# Patient Record
Sex: Female | Born: 1980 | Race: White | Hispanic: No | Smoking: Former smoker
Health system: Southern US, Community
[De-identification: ages and names within clinical notes are randomized; demographics above are authoritative.]

## PROBLEM LIST (undated history)

## (undated) DIAGNOSIS — T7840XA Allergy, unspecified, initial encounter: Secondary | ICD-10-CM

## (undated) DIAGNOSIS — F419 Anxiety disorder, unspecified: Secondary | ICD-10-CM

## (undated) DIAGNOSIS — F988 Other specified behavioral and emotional disorders with onset usually occurring in childhood and adolescence: Secondary | ICD-10-CM

## (undated) DIAGNOSIS — F32A Depression, unspecified: Secondary | ICD-10-CM

## (undated) DIAGNOSIS — D649 Anemia, unspecified: Secondary | ICD-10-CM

## (undated) DIAGNOSIS — G43909 Migraine, unspecified, not intractable, without status migrainosus: Secondary | ICD-10-CM

## (undated) HISTORY — DX: Other specified behavioral and emotional disorders with onset usually occurring in childhood and adolescence: F98.8

## (undated) HISTORY — DX: Allergy, unspecified, initial encounter: T78.40XA

## (undated) HISTORY — DX: Anxiety disorder, unspecified: F41.9

## (undated) HISTORY — DX: Anemia, unspecified: D64.9

## (undated) HISTORY — DX: Migraine, unspecified, not intractable, without status migrainosus: G43.909

## (undated) HISTORY — PX: TONSILLECTOMY: SHX5217

## (undated) HISTORY — DX: Depression, unspecified: F32.A

---
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
## 2008-01-07 ENCOUNTER — Ambulatory Visit: Payer: Self-pay

## 2009-03-06 ENCOUNTER — Emergency Department: Payer: Self-pay | Admitting: Unknown Physician Specialty

## 2010-03-29 ENCOUNTER — Ambulatory Visit: Payer: Self-pay | Admitting: Gynecologic Oncology

## 2010-04-04 ENCOUNTER — Ambulatory Visit: Payer: Self-pay | Admitting: Gynecologic Oncology

## 2010-04-04 ENCOUNTER — Ambulatory Visit: Payer: Self-pay

## 2010-04-27 ENCOUNTER — Ambulatory Visit: Payer: Self-pay | Admitting: Gynecologic Oncology

## 2013-10-05 ENCOUNTER — Ambulatory Visit: Payer: Self-pay

## 2013-11-18 DIAGNOSIS — Z8742 Personal history of other diseases of the female genital tract: Secondary | ICD-10-CM | POA: Insufficient documentation

## 2013-11-18 DIAGNOSIS — N879 Dysplasia of cervix uteri, unspecified: Secondary | ICD-10-CM | POA: Insufficient documentation

## 2014-06-05 ENCOUNTER — Emergency Department
Admit: 2014-06-05 | Disposition: A | Discharge status: Home / Self Care | Payer: Self-pay | Admitting: Emergency Medicine

## 2014-11-26 ENCOUNTER — Encounter: Payer: Self-pay | Admitting: Physician Assistant

## 2014-11-26 ENCOUNTER — Ambulatory Visit (INDEPENDENT_AMBULATORY_CARE_PROVIDER_SITE_OTHER): Payer: BLUE CROSS/BLUE SHIELD | Admitting: Physician Assistant

## 2014-11-26 VITALS — BP 108/60 | HR 80 | Temp 98.3°F | Resp 18 | Ht 66.0 in | Wt 123.8 lb

## 2014-11-26 DIAGNOSIS — R41840 Attention and concentration deficit: Secondary | ICD-10-CM | POA: Diagnosis not present

## 2014-11-26 DIAGNOSIS — Z7189 Other specified counseling: Secondary | ICD-10-CM

## 2014-11-26 DIAGNOSIS — Z23 Encounter for immunization: Secondary | ICD-10-CM | POA: Diagnosis not present

## 2014-11-26 DIAGNOSIS — Z7689 Persons encountering health services in other specified circumstances: Secondary | ICD-10-CM

## 2014-11-26 DIAGNOSIS — Z87891 Personal history of nicotine dependence: Secondary | ICD-10-CM | POA: Insufficient documentation

## 2014-11-26 DIAGNOSIS — Z720 Tobacco use: Secondary | ICD-10-CM | POA: Insufficient documentation

## 2014-11-26 NOTE — Patient Instructions (Signed)
Health Maintenance Adopting a healthy lifestyle and getting preventive care can go a long way to promote health and wellness. Talk with your health care provider about what schedule of regular examinations is right for you. This is a good chance for you to check in with your provider about disease prevention and staying healthy. In between checkups, there are plenty of things you can do on your own. Experts have done a lot of research about which lifestyle changes and preventive measures are most likely to keep you healthy. Ask your health care provider for more information. WEIGHT AND DIET  Eat a healthy diet  Be sure to include plenty of vegetables, fruits, low-fat dairy products, and lean protein.  Do not eat a lot of foods high in solid fats, added sugars, or salt.  Get regular exercise. This is one of the most important things you can do for your health.  Most adults should exercise for at least 150 minutes each week. The exercise should increase your heart rate and make you sweat (moderate-intensity exercise).  Most adults should also do strengthening exercises at least twice a week. This is in addition to the moderate-intensity exercise.  Maintain a healthy weight  Body mass index (BMI) is a measurement that can be used to identify possible weight problems. It estimates body fat based on height and weight. Your health care provider can help determine your BMI and help you achieve or maintain a healthy weight.  For females 25 years of age and older:   A BMI below 18.5 is considered underweight.  A BMI of 18.5 to 24.9 is normal.  A BMI of 25 to 29.9 is considered overweight.  A BMI of 30 and above is considered obese.  Watch levels of cholesterol and blood lipids  You should start having your blood tested for lipids and cholesterol at 36 years of age, then have this test every 5 years.  You may need to have your cholesterol levels checked more often if:  Your lipid or  cholesterol levels are high.  You are older than 36 years of age.  You are at high risk for heart disease.  CANCER SCREENING   Lung Cancer  Lung cancer screening is recommended for adults 97-92 years old who are at high risk for lung cancer because of a history of smoking.  A yearly low-dose CT scan of the lungs is recommended for people who:  Currently smoke.  Have quit within the past 15 years.  Have at least a 30-pack-year history of smoking. A pack year is smoking an average of one pack of cigarettes a day for 1 year.  Yearly screening should continue until it has been 15 years since you quit.  Yearly screening should stop if you develop a health problem that would prevent you from having lung cancer treatment.  Breast Cancer  Practice breast self-awareness. This means understanding how your breasts normally appear and feel.  It also means doing regular breast self-exams. Let your health care provider know about any changes, no matter how small.  If you are in your 20s or 30s, you should have a clinical breast exam (CBE) by a health care provider every 1-3 years as part of a regular health exam.  If you are 76 or older, have a CBE every year. Also consider having a breast X-ray (mammogram) every year.  If you have a family history of breast cancer, talk to your health care provider about genetic screening.  If you are  at high risk for breast cancer, talk to your health care provider about having an MRI and a mammogram every year.  Breast cancer gene (BRCA) assessment is recommended for women who have family members with BRCA-related cancers. BRCA-related cancers include:  Breast.  Ovarian.  Tubal.  Peritoneal cancers.  Results of the assessment will determine the need for genetic counseling and BRCA1 and BRCA2 testing. Cervical Cancer Routine pelvic examinations to screen for cervical cancer are no longer recommended for nonpregnant women who are considered low  risk for cancer of the pelvic organs (ovaries, uterus, and vagina) and who do not have symptoms. A pelvic examination may be necessary if you have symptoms including those associated with pelvic infections. Ask your health care provider if a screening pelvic exam is right for you.   The Pap test is the screening test for cervical cancer for women who are considered at risk.  If you had a hysterectomy for a problem that was not cancer or a condition that could lead to cancer, then you no longer need Pap tests.  If you are older than 65 years, and you have had normal Pap tests for the past 10 years, you no longer need to have Pap tests.  If you have had past treatment for cervical cancer or a condition that could lead to cancer, you need Pap tests and screening for cancer for at least 20 years after your treatment.  If you no longer get a Pap test, assess your risk factors if they change (such as having a new sexual partner). This can affect whether you should start being screened again.  Some women have medical problems that increase their chance of getting cervical cancer. If this is the case for you, your health care provider may recommend more frequent screening and Pap tests.  The human papillomavirus (HPV) test is another test that may be used for cervical cancer screening. The HPV test looks for the virus that can cause cell changes in the cervix. The cells collected during the Pap test can be tested for HPV.  The HPV test can be used to screen women 30 years of age and older. Getting tested for HPV can extend the interval between normal Pap tests from three to five years.  An HPV test also should be used to screen women of any age who have unclear Pap test results.  After 36 years of age, women should have HPV testing as often as Pap tests.  Colorectal Cancer  This type of cancer can be detected and often prevented.  Routine colorectal cancer screening usually begins at 36 years of  age and continues through 36 years of age.  Your health care provider may recommend screening at an earlier age if you have risk factors for colon cancer.  Your health care provider may also recommend using home test kits to check for hidden blood in the stool.  A small camera at the end of a tube can be used to examine your colon directly (sigmoidoscopy or colonoscopy). This is done to check for the earliest forms of colorectal cancer.  Routine screening usually begins at age 50.  Direct examination of the colon should be repeated every 5-10 years through 36 years of age. However, you may need to be screened more often if early forms of precancerous polyps or small growths are found. Skin Cancer  Check your skin from head to toe regularly.  Tell your health care provider about any new moles or changes in   moles, especially if there is a change in a mole's shape or color.  Also tell your health care provider if you have a mole that is larger than the size of a pencil eraser.  Always use sunscreen. Apply sunscreen liberally and repeatedly throughout the day.  Protect yourself by wearing long sleeves, pants, a wide-brimmed hat, and sunglasses whenever you are outside. HEART DISEASE, DIABETES, AND HIGH BLOOD PRESSURE   Have your blood pressure checked at least every 1-2 years. High blood pressure causes heart disease and increases the risk of stroke.  If you are between 75 years and 42 years old, ask your health care provider if you should take aspirin to prevent strokes.  Have regular diabetes screenings. This involves taking a blood sample to check your fasting blood sugar level.  If you are at a normal weight and have a low risk for diabetes, have this test once every three years after 36 years of age.  If you are overweight and have a high risk for diabetes, consider being tested at a younger age or more often. PREVENTING INFECTION  Hepatitis B  If you have a higher risk for  hepatitis B, you should be screened for this virus. You are considered at high risk for hepatitis B if:  You were born in a country where hepatitis B is common. Ask your health care provider which countries are considered high risk.  Your parents were born in a high-risk country, and you have not been immunized against hepatitis B (hepatitis B vaccine).  You have HIV or AIDS.  You use needles to inject street drugs.  You live with someone who has hepatitis B.  You have had sex with someone who has hepatitis B.  You get hemodialysis treatment.  You take certain medicines for conditions, including cancer, organ transplantation, and autoimmune conditions. Hepatitis C  Blood testing is recommended for:  Everyone born from [birth year] through 1967.  Anyone with known risk factors for hepatitis C. Sexually transmitted infections (STIs)  You should be screened for sexually transmitted infections (STIs) including gonorrhea and chlamydia if:  You are sexually active and are younger than 36 years of age.  You are older than 36 years of age and your health care provider tells you that you are at risk for this type of infection.  Your sexual activity has changed since you were last screened and you are at an increased risk for chlamydia or gonorrhea. Ask your health care provider if you are at risk.  If you do not have HIV, but are at risk, it may be recommended that you take a prescription medicine daily to prevent HIV infection. This is called pre-exposure prophylaxis (PrEP). You are considered at risk if:  You are sexually active and do not regularly use condoms or know the HIV status of your partner(s).  You take drugs by injection.  You are sexually active with a partner who has HIV. Talk with your health care provider about whether you are at high risk of being infected with HIV. If you choose to begin PrEP, you should first be tested for HIV. You should then be tested every 3 months for  as long as you are taking PrEP.  PREGNANCY   If you are premenopausal and you may become pregnant, ask your health care provider about preconception counseling.  If you may become pregnant, take 400 to 800 micrograms (mcg) of folic acid every day.  If you want to prevent pregnancy, talk to your  health care provider about birth control (contraception). OSTEOPOROSIS AND MENOPAUSE   Osteoporosis is a disease in which the bones lose minerals and strength with aging. This can result in serious bone fractures. Your risk for osteoporosis can be identified using a bone density scan.  If you are 20 years of age or older, or if you are at risk for osteoporosis and fractures, ask your health care provider if you should be screened.  Ask your health care provider whether you should take a calcium or vitamin D supplement to lower your risk for osteoporosis.  Menopause may have certain physical symptoms and risks.  Hormone replacement therapy may reduce some of these symptoms and risks. Talk to your health care provider about whether hormone replacement therapy is right for you.  HOME CARE INSTRUCTIONS   Schedule regular health, dental, and eye exams.  Stay current with your immunizations.   Do not use any tobacco products including cigarettes, chewing tobacco, or electronic cigarettes.  If you are pregnant, do not drink alcohol.  If you are breastfeeding, limit how much and how often you drink alcohol.  Limit alcohol intake to no more than 1 drink per day for nonpregnant women. One drink equals 12 ounces of beer, 5 ounces of wine, or 1 ounces of hard liquor.  Do not use street drugs.  Do not share needles.  Ask your health care provider for help if you need support or information about quitting drugs.  Tell your health care provider if you often feel depressed.  Tell your health care provider if you have ever been abused or do not feel safe at home. Document Released: 08/28/2010  Document Revised: 06/29/2013 Document Reviewed: 01/14/2013 [facility] Patient Information 2015 [place], [place]. This information is not intended to replace advice given to you by your health care provider. Make sure you discuss any questions you have with your health care provider.  Attention Deficit Hyperactivity Disorder Attention deficit hyperactivity disorder (ADHD) is a problem with behavior issues based on the way the brain functions (neurobehavioral disorder). It is a common reason for behavior and academic problems in school. SYMPTOMS  There are 3 types of ADHD. The 3 types and some of the symptoms include:  Inattentive.  Gets bored or distracted easily.  Loses or forgets things. Forgets to hand in homework.  Has trouble organizing or completing tasks.  Difficulty staying on task.  An inability to organize daily tasks and school work.  Leaving projects, chores, or homework unfinished.  Trouble paying attention or responding to details. Careless mistakes.  Difficulty following directions. Often seems like is not listening.  Dislikes activities that require sustained attention (like chores or homework).  Hyperactive-impulsive.  Feels like it is impossible to sit still or stay in a seat. Fidgeting with hands and feet.  Trouble waiting turn.  Talking too much or out of turn. Interruptive.  Speaks or acts impulsively.  Aggressive, disruptive behavior.  Constantly busy or on the go; noisy.  Often leaves seat when they are expected to remain seated.  Often runs or climbs where it is not appropriate, or feels very restless.  Combined.  Has symptoms of both of the above. Often children with ADHD feel discouraged about themselves and with school. They often perform well below their abilities in school. As children get older, the excess motor activities can calm down, but the problems with paying attention and staying organized persist. Most children do not outgrow ADHD  but with good treatment can learn to cope with  the symptoms. DIAGNOSIS  When ADHD is suspected, the diagnosis should be made by professionals trained in ADHD. This professional will collect information about the individual suspected of having ADHD. Information must be collected from various settings where the person lives, works, or attends school.  Diagnosis will include:  Confirming symptoms began in childhood.  Ruling out other reasons for the child's behavior.  The health care providers will check with the child's school and check their medical records.  They will talk to teachers and parents.  Behavior rating scales for the child will be filled out by those dealing with the child on a daily basis. A diagnosis is made only after all information has been considered. TREATMENT  Treatment usually includes behavioral treatment, tutoring or extra support in school, and stimulant medicines. Because of the way a person's brain works with ADHD, these medicines decrease impulsivity and hyperactivity and increase attention. This is different than how they would work in a person who does not have ADHD. Other medicines used include antidepressants and certain blood pressure medicines. Most experts agree that treatment for ADHD should address all aspects of the person's functioning. Along with medicines, treatment should include structured classroom management at school. Parents should reward good behavior, provide constant discipline, and set limits. Tutoring should be available for the child as needed. ADHD is a lifelong condition. If untreated, the disorder can have long-term serious effects into adolescence and adulthood. HOME CARE INSTRUCTIONS   Often with ADHD there is a lot of frustration among family members dealing with the condition. Blame and anger are also feelings that are common. In many cases, because the problem affects the family as a whole, the entire family may need help. A therapist  can help the family find better ways to handle the disruptive behaviors of the person with ADHD and promote change. If the person with ADHD is young, most of the therapist's work is with the parents. Parents will learn techniques for coping with and improving their child's behavior. Sometimes only the child with the ADHD needs counseling. Your health care providers can help you make these decisions.  Children with ADHD may need help learning how to organize. Some helpful tips include:  Keep routines the same every day from wake-up time to bedtime. Schedule all activities, including homework and playtime. Keep the schedule in a place where the person with ADHD will often see it. Mark schedule changes as far in advance as possible.  Schedule outdoor and indoor recreation.  Have a place for everything and keep everything in its place. This includes clothing, backpacks, and school supplies.  Encourage writing down assignments and bringing home needed books. Work with your child's teachers for assistance in organizing school work.  Offer your child a well-balanced diet. Breakfast that includes a balance of whole grains, protein, and fruits or vegetables is especially important for school performance. Children should avoid drinks with caffeine including:  Soft drinks.  Coffee.  Tea.  However, some older children (adolescents) may find these drinks helpful in improving their attention. Because it can also be common for adolescents with ADHD to become addicted to caffeine, talk with your health care provider about what is a safe amount of caffeine intake for your child.  Children with ADHD need consistent rules that they can understand and follow. If rules are followed, give small rewards. Children with ADHD often receive, and expect, criticism. Look for good behavior and praise it. Set realistic goals. Give clear instructions. Look for activities that  can foster success and self-esteem. Make time for  pleasant activities with your child. Give lots of affection.  Parents are their children's greatest advocates. Learn as much as possible about ADHD. This helps you become a stronger and better advocate for your child. It also helps you educate your child's teachers and instructors if they feel inadequate in these areas. Parent support groups are often helpful. A national group with local chapters is called Children and Adults with Attention Deficit Hyperactivity Disorder (CHADD). SEEK MEDICAL CARE IF:  Your child has repeated muscle twitches, cough, or speech outbursts.  Your child has sleep problems.  Your child has a marked loss of appetite.  Your child develops depression.  Your child has new or worsening behavioral problems.  Your child develops dizziness.  Your child has a racing heart.  Your child has stomach pains.  Your child develops headaches. SEEK IMMEDIATE MEDICAL CARE IF:  Your child has been diagnosed with depression or anxiety and the symptoms seem to be getting worse.  Your child has been depressed and suddenly appears to have increased energy or motivation.  You are worried that your child is having a bad reaction to a medication he or she is taking for ADHD. Document Released: 02/02/2002 Document Revised: 02/17/2013 Document Reviewed: 10/20/2012 [facility] Patient Information 2015 [place], [place]. This information is not intended to replace advice given to you by your health care provider. Make sure you discuss any questions you have with your health care provider.

## 2014-11-26 NOTE — Progress Notes (Signed)
Patient: Shirley Rollins Female    DOB: September 30, 1980   34 y.o.   MRN: [id number] Visit Date: 11/26/2014  Today's Provider: [name], [name]   Chief Complaint  Patient presents with  . Establish Care  . ADHD    wants to talk with provider   Subjective:    HPI VERDELLA Rollins is 34 years is here today to Establish Care and concern about ADHD. Patients states is hard to focus, can't sit still, is always late for things like for class. Feels that needs medicine.  She is concerned her son has similar symptoms.  She does not want to have to medicate him, but feels if she could get better control of her symptoms she can help him better. 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  She most recently had her IUD removed on 10/18/14 at [facility].  Pap smear was WNL.  Breast exam was normal.  She has not had any baseline labs done that she can recall.  She also would like her flu shot today.    Allergies  Allergen Reactions  . Penicillin G Anaphylaxis  . Sulfa Antibiotics Other (See Comments)   Previous Medications   CHANTIX CONTINUING MONTH PAK 1 MG TABLET        Review of Systems  All other systems reviewed and are negative.   Social History  Substance Use Topics  . Smoking status: Current Every Day Smoker  . Smokeless tobacco: Not on file     Comment: 5 cigarretes a day.   . Alcohol Use: Yes     Comment: occassional   Objective:   BP 108/60 mmHg  Pulse 80  Temp(Src) 98.3 F (36.8 C) (Oral)  Resp 18  Ht  (1.676 m)  Wt 123 lb 12.8 oz (56.155 kg)  BMI 19.99 kg/m2  LMP   Physical Exam  Constitutional: She is oriented to person, place, and time. She appears well-developed and well-nourished. No distress.  HENT:  Head: Normocephalic and atraumatic.  Right Ear: External ear normal.  Left Ear: External ear normal.  Nose: Nose normal.  Mouth/Throat: Oropharynx is clear and moist. No oropharyngeal exudate.    Eyes: Conjunctivae and EOM are normal. Pupils are equal, round, and reactive to light. Right eye exhibits no discharge. Left eye exhibits no discharge. No scleral icterus.  Neck: Normal range of motion. Neck supple. No JVD present. No tracheal deviation present. No thyromegaly present.  Cardiovascular: Normal rate, regular rhythm, normal heart sounds and intact distal pulses.  Exam reveals no gallop and no friction rub.   No murmur heard. Pulmonary/Chest: Effort normal and breath sounds normal. No respiratory distress. She has no wheezes. She has no rales. She exhibits no tenderness.  Abdominal: Soft. Bowel sounds are normal. She exhibits no distension and no mass. There is no tenderness. There is no rebound and no guarding.  Genitourinary:  Deferred-done at [facility]  Musculoskeletal: Normal range of motion. She exhibits no edema or tenderness.  Lymphadenopathy:    She has no cervical adenopathy.  Neurological: She is alert and oriented to person, place, and time.  Skin: Skin is warm and dry. No rash noted. She is not diaphoretic.  Psychiatric: She has a normal mood and affect. Her behavior is normal. Judgment and thought content normal.  Vitals reviewed.       Assessment & Plan:     1. Establishing care  with new doctor, encounter for Will get baseline labs as she has never had any.  F/U pending lab results. - TSH - CBC with Differential - Comprehensive Metabolic Panel (CMET) - Lipid panel  2. Difficulty concentrating States this has been an issue all her life.  Never tested or treated.  Now starting to become an issue as it is affecting her college career.  Will refer for testing.  If she test positive she may return here to discuss treatment.  Advised her to call the office once she is seen by psychology for f/u. - Ambulatory referral to Psychology  3. Need for influenza vaccination Flu vaccine given today in the office.  Tolerated well.  No complications. - Flu Vaccine  QUAD 36+ mos IM  4. History of participation in smoking cessation counseling Currently on chantix continuation pack and doing well.  She has cut back to only 3-5 cigarettes daily.  Attempted to quit last year and did not succeed.  Feels better about it this time.       [name], [name]  [facility]

## 2014-11-27 LAB — CBC WITH DIFFERENTIAL/PLATELET
BASOS ABS: 0.1 10*3/uL (ref 0.0–0.2)
Basos: 1 %
EOS (ABSOLUTE): 0.1 10*3/uL (ref 0.0–0.4)
Eos: 2 %
HEMOGLOBIN: 14.7 g/dL (ref 11.1–15.9)
Hematocrit: 42.2 % (ref 34.0–46.6)
IMMATURE GRANULOCYTES: 0 %
Immature Grans (Abs): 0 10*3/uL (ref 0.0–0.1)
LYMPHS: 33 %
Lymphocytes Absolute: 1.8 10*3/uL (ref 0.7–3.1)
MCH: 33.4 pg — AB (ref 26.6–33.0)
MCHC: 34.8 g/dL (ref 31.5–35.7)
MCV: 96 fL (ref 79–97)
MONOCYTES: 10 %
Monocytes Absolute: 0.5 10*3/uL (ref 0.1–0.9)
NEUTROS ABS: 2.9 10*3/uL (ref 1.4–7.0)
NEUTROS PCT: 54 %
PLATELETS: 286 10*3/uL (ref 150–379)
RBC: 4.4 x10E6/uL (ref 3.77–5.28)
RDW: 12 % — ABNORMAL LOW (ref 12.3–15.4)
WBC: 5.3 10*3/uL (ref 3.4–10.8)

## 2014-11-27 LAB — COMPREHENSIVE METABOLIC PANEL
ALBUMIN: 4.9 g/dL (ref 3.5–5.5)
ALT: 11 IU/L (ref 0–32)
AST: 15 IU/L (ref 0–40)
Albumin/Globulin Ratio: 1.8 (ref 1.1–2.5)
Alkaline Phosphatase: 37 IU/L — ABNORMAL LOW (ref 39–117)
BILIRUBIN TOTAL: 0.8 mg/dL (ref 0.0–1.2)
BUN / CREAT RATIO: 18 (ref 8–20)
BUN: 12 mg/dL (ref 6–20)
CALCIUM: 10.3 mg/dL — AB (ref 8.7–10.2)
CHLORIDE: 99 mmol/L (ref 97–108)
CO2: 23 mmol/L (ref 18–29)
CREATININE: 0.68 mg/dL (ref 0.57–1.00)
GFR, EST AFRICAN AMERICAN: 130 mL/min/{1.73_m2} (ref >59)
GFR, EST NON AFRICAN AMERICAN: 113 mL/min/{1.73_m2} (ref >59)
GLUCOSE: 100 mg/dL — AB (ref 65–99)
Globulin, Total: 2.7 g/dL (ref 1.5–4.5)
Potassium: 4.4 mmol/L (ref 3.5–5.2)
Sodium: 139 mmol/L (ref 134–144)
TOTAL PROTEIN: 7.6 g/dL (ref 6.0–8.5)

## 2014-11-27 LAB — LIPID PANEL
CHOL/HDL RATIO: 3.9 ratio (ref 0.0–4.4)
Cholesterol, Total: 178 mg/dL (ref 100–199)
HDL: 46 mg/dL (ref >39)
LDL CALC: 107 mg/dL — AB (ref 0–99)
Triglycerides: 126 mg/dL (ref 0–149)
VLDL CHOLESTEROL CAL: 25 mg/dL (ref 5–40)

## 2014-11-27 LAB — TSH: TSH: 2.39 u[IU]/mL (ref 0.450–4.500)

## 2014-11-29 ENCOUNTER — Telehealth: Payer: Self-pay

## 2014-11-29 NOTE — Telephone Encounter (Signed)
Patient advised as directed below. Patient wants a copy of labs results. Patient advised copy up front for pick up.  Thanks,  -Jenner Rosier

## 2014-11-29 NOTE — Telephone Encounter (Signed)
-----   Message from [name], [name] sent at 11/29/2014  9:49 AM EDT ----- All labs are stable and WNL.

## 2014-12-20 ENCOUNTER — Telehealth: Payer: Self-pay | Admitting: Physician Assistant

## 2014-12-20 NOTE — Telephone Encounter (Signed)
Called pt to schedule follow up appt. LMTCB. Thanks TNP

## 2014-12-20 NOTE — Telephone Encounter (Signed)
Can we see if she has a f/u already.  If not can we schedule her for a f/u to discuss medications.  Thanks!   Daiva NakayamaJenni Burnette, [name]

## 2014-12-21 NOTE — Telephone Encounter (Signed)
Appointment scheduled for 12/24/2014@2 :00/MW

## 2014-12-24 ENCOUNTER — Ambulatory Visit (INDEPENDENT_AMBULATORY_CARE_PROVIDER_SITE_OTHER): Payer: BLUE CROSS/BLUE SHIELD | Admitting: Physician Assistant

## 2014-12-24 ENCOUNTER — Encounter: Payer: Self-pay | Admitting: Physician Assistant

## 2014-12-24 VITALS — BP 100/60 | HR 86 | Temp 98.8°F | Resp 16 | Wt 123.4 lb

## 2014-12-24 DIAGNOSIS — F900 Attention-deficit hyperactivity disorder, predominantly inattentive type: Secondary | ICD-10-CM

## 2014-12-24 DIAGNOSIS — Z8349 Family history of other endocrine, nutritional and metabolic diseases: Secondary | ICD-10-CM

## 2014-12-24 DIAGNOSIS — F988 Other specified behavioral and emotional disorders with onset usually occurring in childhood and adolescence: Secondary | ICD-10-CM

## 2014-12-24 MED ORDER — AMPHETAMINE-DEXTROAMPHET ER 10 MG PO CP24: 10.0000 mg | ORAL_CAPSULE | Freq: Every day | ORAL | Status: DC

## 2014-12-24 NOTE — Progress Notes (Signed)
Patient: Shirley LarssonJessica S Rollins Female    DOB: 10/02/1980   34 y.o.   MRN: [id number] Visit Date: 12/24/2014  Today's Provider: Margaretann LovelessJennifer M Burnette, [name]   Chief Complaint  Patient presents with  . ADHD    patient here to discuss medications about ADD   Subjective:    HPI   Patient Shirley LarssonJessica S Rollins is a 34 year old here to discuss the medication for her ADD.  She was seen by Elna BreslowBruce Thompson for her ADD, last visit with the doctor was last Wednesday. She is going to continue to follow up with him every 2 weeks for behavioral modification. She is also interested in starting a low-dose medication to help control the ADD symptoms.   Allergies  Allergen Reactions  . Penicillin G Anaphylaxis  . Sulfa Antibiotics Other (See Comments)   Previous Medications   CHANTIX CONTINUING MONTH PAK 1 MG TABLET        Review of Systems  Constitutional: Negative.   Respiratory: Negative.   Cardiovascular: Negative.   Endocrine: Negative.   Psychiatric/Behavioral: Positive for sleep disturbance and decreased concentration. Negative for suicidal ideas, dysphoric mood and agitation. The patient is not nervous/anxious and is not hyperactive.     Social History  Substance Use Topics  . Smoking status: Current Every Day Smoker  . Smokeless tobacco: Not on file     Comment: 5 cigarretes a day.   . Alcohol Use: Yes     Comment: occassional   Objective:   There were no vitals taken for this visit.  Physical Exam  Constitutional: She appears well-developed and well-nourished. No distress.  Cardiovascular: Normal rate, regular rhythm and normal heart sounds.  Exam reveals no gallop and no friction rub.   No murmur heard. Pulmonary/Chest: Effort normal and breath sounds normal. No respiratory distress. She has no wheezes. She has no rales.  Skin: She is not diaphoretic.  Psychiatric: She has a normal mood and affect. Her behavior is normal. Judgment and thought content normal.  Vitals  reviewed.       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 She is continuing to follow up with him every 2 weeks for behavioral modification for her ADD. She is also interested in starting medication to see if she gets any benefit and increased focus. We will start with Adderall extended release 10 mg as below. She is to call the office if she has any adverse reactions to this medication. I will see her back in 4 weeks to see how she is doing with this medication to see if when he to increase the dose or change. - amphetamine-dextroamphetamine (ADDERALL XR) 10 MG 24 hr capsule; Take 1 capsule (10 mg total) by mouth daily.  Dispense: 30 capsule; Refill: 0  2. Serum calcium elevated She did have a slight elevation in her serum calcium level on most recent labs. She does have family history of hyperparathyroidism in her grandmother. I will check a PTH as below. I will follow-up with her pending these lab results. - PTH, Intact and Calcium  3. Family history of goiter She does have family history of hyperparathyroidism as above as well as goiter. Most recent TSH was within normal limits however she would like to have her T3 and T4 checked as well to make sure there are no other signs of this. I will follow-up with her  pending lab results. - T3, free - T4, free       [name], [name]  [facility]

## 2014-12-24 NOTE — Patient Instructions (Signed)
Attention Deficit Hyperactivity Disorder  Attention deficit hyperactivity disorder (ADHD) is a problem with behavior issues based on the way the brain functions (neurobehavioral disorder). It is a common reason for behavior and academic problems in school.  SYMPTOMS   There are 3 types of ADHD. The 3 types and some of the symptoms include:  · Inattentive.    Gets bored or distracted easily.    Loses or forgets things. Forgets to hand in homework.    Has trouble organizing or completing tasks.    Difficulty staying on task.    An inability to organize daily tasks and school work.    Leaving projects, chores, or homework unfinished.    Trouble paying attention or responding to details. Careless mistakes.    Difficulty following directions. Often seems like is not listening.    Dislikes activities that require sustained attention (like chores or homework).  · Hyperactive-impulsive.    Feels like it is impossible to sit still or stay in a seat. Fidgeting with hands and feet.    Trouble waiting turn.    Talking too much or out of turn. Interruptive.    Speaks or acts impulsively.    Aggressive, disruptive behavior.    Constantly busy or on the go; noisy.    Often leaves seat when they are expected to remain seated.    Often runs or climbs where it is not appropriate, or feels very restless.  · Combined.    Has symptoms of both of the above.  Often children with ADHD feel discouraged about themselves and with school. They often perform well below their abilities in school.  As children get older, the excess motor activities can calm down, but the problems with paying attention and staying organized persist. Most children do not outgrow ADHD but with good treatment can learn to cope with the symptoms.  DIAGNOSIS   When ADHD is suspected, the diagnosis should be made by professionals trained in ADHD. This professional will collect information about the individual suspected of having ADHD. Information must be collected from  various settings where the person lives, works, or attends school.    Diagnosis will include:  · Confirming symptoms began in childhood.  · Ruling out other reasons for the child's behavior.  · The health care providers will check with the child's school and check their medical records.  · They will talk to teachers and parents.  · Behavior rating scales for the child will be filled out by those dealing with the child on a daily basis.  A diagnosis is made only after all information has been considered.  TREATMENT   Treatment usually includes behavioral treatment, tutoring or extra support in school, and stimulant medicines. Because of the way a person's brain works with ADHD, these medicines decrease impulsivity and hyperactivity and increase attention. This is different than how they would work in a person who does not have ADHD. Other medicines used include antidepressants and certain blood pressure medicines.  Most experts agree that treatment for ADHD should address all aspects of the person's functioning. Along with medicines, treatment should include structured classroom management at school. Parents should reward good behavior, provide constant discipline, and set limits. Tutoring should be available for the child as needed.  ADHD is a lifelong condition. If untreated, the disorder can have long-term serious effects into adolescence and adulthood.  HOME CARE INSTRUCTIONS   · Often with ADHD there is a lot of frustration among family members dealing with the condition. Blame   and anger are also feelings that are common. In many cases, because the problem affects the family as a whole, the entire family may need help. A therapist can help the family find better ways to handle the disruptive behaviors of the person with ADHD and promote change. If the person with ADHD is young, most of the therapist's work is with the parents. Parents will learn techniques for coping with and improving their child's behavior.  Sometimes only the child with the ADHD needs counseling. Your health care providers can help you make these decisions.  · Children with ADHD may need help learning how to organize. Some helpful tips include:  ¨ Keep routines the same every day from wake-up time to bedtime. Schedule all activities, including homework and playtime. Keep the schedule in a place where the person with ADHD will often see it. Mark schedule changes as far in advance as possible.  ¨ Schedule outdoor and indoor recreation.  ¨ Have a place for everything and keep everything in its place. This includes clothing, backpacks, and school supplies.  ¨ Encourage writing down assignments and bringing home needed books. Work with your child's teachers for assistance in organizing school work.  · Offer your child a well-balanced diet. Breakfast that includes a balance of whole grains, protein, and fruits or vegetables is especially important for school performance. Children should avoid drinks with caffeine including:  ¨ Soft drinks.  ¨ Coffee.  ¨ Tea.  ¨ However, some older children (adolescents) may find these drinks helpful in improving their attention. Because it can also be common for adolescents with ADHD to become addicted to caffeine, talk with your health care provider about what is a safe amount of caffeine intake for your child.  · Children with ADHD need consistent rules that they can understand and follow. If rules are followed, give small rewards. Children with ADHD often receive, and expect, criticism. Look for good behavior and praise it. Set realistic goals. Give clear instructions. Look for activities that can foster success and self-esteem. Make time for pleasant activities with your child. Give lots of affection.  · Parents are their children's greatest advocates. Learn as much as possible about ADHD. This helps you become a stronger and better advocate for your child. It also helps you educate your child's teachers and instructors  if they feel inadequate in these areas. Parent support groups are often helpful. A national group with local chapters is called Children and Adults with Attention Deficit Hyperactivity Disorder (CHADD).  SEEK MEDICAL CARE IF:  · Your child has repeated muscle twitches, cough, or speech outbursts.  · Your child has sleep problems.  · Your child has a marked loss of appetite.  · Your child develops depression.  · Your child has new or worsening behavioral problems.  · Your child develops dizziness.  · Your child has a racing heart.  · Your child has stomach pains.  · Your child develops headaches.  SEEK IMMEDIATE MEDICAL CARE IF:  · Your child has been diagnosed with depression or anxiety and the symptoms seem to be getting worse.  · Your child has been depressed and suddenly appears to have increased energy or motivation.  · You are worried that your child is having a bad reaction to a medication he or she is taking for ADHD.     This information is not intended to replace advice given to you by your health care provider. Make sure you discuss any questions you have with your   health care provider.     Document Released: 02/02/2002 Document Revised: 02/17/2013 Document Reviewed: 10/20/2012  Elsevier Interactive Patient Education ©2016 [employer]  Amphetamine; Dextroamphetamine extended-release capsules  What is this medicine?  AMPHETAMINE; DEXTROAMPHETAMINE (am FET a meen; dex troe am FET a meen) is used to treat attention-deficit hyperactivity disorder (ADHD). Federal law prohibits giving this medicine to any person other than the person for whom it was prescribed. Do not share this medicine with anyone else.  This medicine may be used for other purposes; ask your health care provider or pharmacist if you have questions.  What should I tell my health care provider before I take this medicine?  They need to know if you have any of these conditions:  -anxiety or panic attacks  -circulation problems in fingers and  toes  -glaucoma  -hardening or blockages of the arteries or heart blood vessels  -heart disease or a heart defect  -high blood pressure  -history of a drug or alcohol abuse problem  -history of stroke  -kidney disease  -liver disease  -mental illness  -seizures  -suicidal thoughts, plans, or attempt; a previous suicide attempt by you or a family member  -thyroid disease  -Tourette's syndrome  -an unusual or allergic reaction to dextroamphetamine, other amphetamines, other medicines, foods, dyes, or preservatives  -pregnant or trying to get pregnant  -breast-feeding  How should I use this medicine?  Take this medicine by mouth with a glass of water. Follow the directions on the prescription label. This medicine is taken just one time per day, usually in the morning after waking up. Take with or without food. Do not chew or crush this medicine. You may open the capsules and sprinkle the medicine on a spoonful of applesauce. If sprinkled on applesauce, take the dose immediately and do not crush or chew. Always drink a glass of water or other liquid after taking this medicine. Do not take your medicine more often than directed.  A special MedGuide will be given to you by the pharmacist with each prescription and refill. Be sure to read this information carefully each time.  Talk to your pediatrician regarding the use of this medicine in children. While this drug may be prescribed for children as young as 6 years for selected conditions, precautions do apply.  Overdosage: If you think you have taken too much of this medicine contact a poison control center or emergency room at once.  NOTE: This medicine is only for you. Do not share this medicine with others.  What if I miss a dose?  If you miss a dose, take it as soon as you can. If it is almost time for your next dose, take only that dose. Do not take double or extra doses.  What may interact with this medicine?  Do not take this medicine with any of the following  medications:  -MAOIs like Carbex, Eldepryl, Marplan, Nardil, and Parnate  -other stimulant medicines for attention disorders, weight loss, or to stay awake  This medicine may also interact with the following medications:  -acetazolamide  -ammonium chloride  -antacids  -ascorbic acid  -atomoxetine  -caffeine  -certain medicines for blood pressure  -certain medicines for depression, anxiety, or psychotic disturbances  -certain medicines for diabetes  -certain medicines for seizures like carbamazepine, phenobarbital, phenytoin  -certain medicines for stomach problems like cimetidine, famotidine, omeprazole, lansoprazole  -cold or allergy medicines  -glutamic acid  -lithium  -meperidine  -methenamine; sodium acid phosphate  -narcotic medicines   for pain  -norepinephrine  -phenothiazines like chlorpromazine, mesoridazine, prochlorperazine, thioridazine  -sodium bicarbonate  This list may not describe all possible interactions. Give your health care provider a list of all the medicines, herbs, non-prescription drugs, or dietary supplements you use. Also tell them if you smoke, drink alcohol, or use illegal drugs. Some items may interact with your medicine.  What should I watch for while using this medicine?  Visit your doctor or health care professional for regular checks on your progress. This prescription requires that you follow special procedures with your doctor and pharmacy. You will need to have a new written prescription from your doctor every time you need a refill.  This medicine may affect your concentration, or hide signs of tiredness. Until you know how this medicine affects you, do not drive, ride a bicycle, use machinery, or do anything that needs mental alertness.  Tell your doctor or health care professional if this medicine loses its effects, or if you feel you need to take more than the prescribed amount. Do not change the dosage without talking to your doctor or health care professional.  Decreased  appetite is a common side effect when starting this medicine. Eating small, frequent meals or snacks can help. Talk to your doctor if you continue to have poor eating habits. Height and weight growth of a child taking this medicine will be monitored closely.  Do not take this medicine close to bedtime. It may prevent you from sleeping.  If you are going to need surgery, an MRI, a CT scan, or other procedure, tell your doctor that you are taking this medicine. You may need to stop taking this medicine before the procedure.  Tell your doctor or healthcare professional right away if you notice unexplained wounds on your fingers and toes while taking this medicine. You should also tell your healthcare provider if you experience numbness or pain, changes in the skin color, or sensitivity to temperature in your fingers or toes.  What side effects may I notice from receiving this medicine?  Side effects that you should report to your doctor or health care professional as soon as possible:  -allergic reactions like skin rash, itching or hives, swelling of the face, lips, or tongue  -changes in vision  -chest pain or chest tightness  -confusion, trouble speaking or understanding  -fast, irregular heartbeat  -fingers or toes feel numb, cool, painful  -hallucination, loss of contact with reality  -high blood pressure  -males: prolonged or painful erection  -seizures  -severe headaches  -shortness of breath  -suicidal thoughts or other mood changes  -trouble walking, dizziness, loss of balance or coordination  -uncontrollable head, mouth, neck, arm, or leg movements  Side effects that usually do not require medical attention (report to your doctor or health care professional if they continue or are bothersome):  -anxious  -headache  -loss of appetite  -nausea, vomiting  -trouble sleeping  -weight loss  This list may not describe all possible side effects. Call your doctor for medical advice about side effects. You may report  side effects to FDA at 1-800-FDA-1088.  Where should I keep my medicine?  Keep out of the reach of children. This medicine can be abused. Keep your medicine in a safe place to protect it from theft. Do not share this medicine with anyone. Selling or giving away this medicine is dangerous and against the law.  Store at room temperature between 15 and 30 degrees C (59 and 86 degrees   F). Keep container tightly closed. Protect from light. Throw away any unused medicine after the expiration date.  NOTE: This sheet is a summary. It may not cover all possible information. If you have questions about this medicine, talk to your doctor, pharmacist, or health care provider.     © 2016, Elsevier/[employer]. (2013-12-16 18:22:45)

## 2014-12-25 LAB — PTH, INTACT AND CALCIUM
Calcium: 10.1 mg/dL (ref 8.7–10.2)
PTH: 20 pg/mL (ref 15–65)

## 2014-12-25 LAB — T3, FREE: T3, Free: 2.8 pg/mL (ref 2.0–4.4)

## 2014-12-25 LAB — T4, FREE: Free T4: 1.19 ng/dL (ref 0.82–1.77)

## 2014-12-27 ENCOUNTER — Telehealth: Payer: Self-pay

## 2014-12-27 NOTE — Telephone Encounter (Signed)
-----   Message from Margaretann LovelessJennifer M Burnette, [name] sent at 12/27/2014  8:54 AM EDT ----- Labs are WNL.  Calcium is normal range now.

## 2014-12-27 NOTE — Telephone Encounter (Signed)
No answer and unable to LM  Thanks,  -Jeriko Kowalke

## 2014-12-27 NOTE — Telephone Encounter (Signed)
Patient advised as directed. Patient voiced understanding.  Thanks,  -Joseline

## 2015-01-19 ENCOUNTER — Telehealth: Payer: Self-pay | Admitting: Physician Assistant

## 2015-01-19 NOTE — Telephone Encounter (Signed)
Patient wants to know when she comes for her appt Friday can she be sent to get a tb test, she is starting a new job

## 2015-01-19 NOTE — Telephone Encounter (Signed)
Yes I can send her down stairs for the quanteferon gold test, or she could go to the health department for a TB skin test.  The TB skin test will be cheaper.  I think the quanteferon gold runs about $80.

## 2015-01-19 NOTE — Telephone Encounter (Signed)
Patient advised as directed below. Per patient will go to the health department since it will be cheaper.  Thanks,  -Wrigley Winborne

## 2015-01-21 ENCOUNTER — Ambulatory Visit (INDEPENDENT_AMBULATORY_CARE_PROVIDER_SITE_OTHER): Payer: BLUE CROSS/BLUE SHIELD | Admitting: Physician Assistant

## 2015-01-21 ENCOUNTER — Encounter: Payer: Self-pay | Admitting: Physician Assistant

## 2015-01-21 VITALS — BP 100/60 | HR 98 | Temp 98.8°F | Resp 16 | Wt 119.6 lb

## 2015-01-21 DIAGNOSIS — F900 Attention-deficit hyperactivity disorder, predominantly inattentive type: Secondary | ICD-10-CM

## 2015-01-21 DIAGNOSIS — F988 Other specified behavioral and emotional disorders with onset usually occurring in childhood and adolescence: Secondary | ICD-10-CM

## 2015-01-21 MED ORDER — AMPHETAMINE-DEXTROAMPHET ER 10 MG PO CP24: 10.0000 mg | ORAL_CAPSULE | Freq: Every day | ORAL | Status: DC

## 2015-01-21 NOTE — Patient Instructions (Signed)
Attention Deficit Hyperactivity Disorder  Attention deficit hyperactivity disorder (ADHD) is a problem with behavior issues based on the way the brain functions (neurobehavioral disorder). It is a common reason for behavior and academic problems in school.  SYMPTOMS   There are 3 types of ADHD. The 3 types and some of the symptoms include:  · Inattentive.    Gets bored or distracted easily.    Loses or forgets things. Forgets to hand in homework.    Has trouble organizing or completing tasks.    Difficulty staying on task.    An inability to organize daily tasks and school work.    Leaving projects, chores, or homework unfinished.    Trouble paying attention or responding to details. Careless mistakes.    Difficulty following directions. Often seems like is not listening.    Dislikes activities that require sustained attention (like chores or homework).  · Hyperactive-impulsive.    Feels like it is impossible to sit still or stay in a seat. Fidgeting with hands and feet.    Trouble waiting turn.    Talking too much or out of turn. Interruptive.    Speaks or acts impulsively.    Aggressive, disruptive behavior.    Constantly busy or on the go; noisy.    Often leaves seat when they are expected to remain seated.    Often runs or climbs where it is not appropriate, or feels very restless.  · Combined.    Has symptoms of both of the above.  Often children with ADHD feel discouraged about themselves and with school. They often perform well below their abilities in school.  As children get older, the excess motor activities can calm down, but the problems with paying attention and staying organized persist. Most children do not outgrow ADHD but with good treatment can learn to cope with the symptoms.  DIAGNOSIS   When ADHD is suspected, the diagnosis should be made by professionals trained in ADHD. This professional will collect information about the individual suspected of having ADHD. Information must be collected from  various settings where the person lives, works, or attends school.    Diagnosis will include:  · Confirming symptoms began in childhood.  · Ruling out other reasons for the child's behavior.  · The health care providers will check with the child's school and check their medical records.  · They will talk to teachers and parents.  · Behavior rating scales for the child will be filled out by those dealing with the child on a daily basis.  A diagnosis is made only after all information has been considered.  TREATMENT   Treatment usually includes behavioral treatment, tutoring or extra support in school, and stimulant medicines. Because of the way a person's brain works with ADHD, these medicines decrease impulsivity and hyperactivity and increase attention. This is different than how they would work in a person who does not have ADHD. Other medicines used include antidepressants and certain blood pressure medicines.  Most experts agree that treatment for ADHD should address all aspects of the person's functioning. Along with medicines, treatment should include structured classroom management at school. Parents should reward good behavior, provide constant discipline, and set limits. Tutoring should be available for the child as needed.  ADHD is a lifelong condition. If untreated, the disorder can have long-term serious effects into adolescence and adulthood.  HOME CARE INSTRUCTIONS   · Often with ADHD there is a lot of frustration among family members dealing with the condition. Blame   and anger are also feelings that are common. In many cases, because the problem affects the family as a whole, the entire family may need help. A therapist can help the family find better ways to handle the disruptive behaviors of the person with ADHD and promote change. If the person with ADHD is young, most of the therapist's work is with the parents. Parents will learn techniques for coping with and improving their child's behavior.  Sometimes only the child with the ADHD needs counseling. Your health care providers can help you make these decisions.  · Children with ADHD may need help learning how to organize. Some helpful tips include:  ¨ Keep routines the same every day from wake-up time to bedtime. Schedule all activities, including homework and playtime. Keep the schedule in a place where the person with ADHD will often see it. Mark schedule changes as far in advance as possible.  ¨ Schedule outdoor and indoor recreation.  ¨ Have a place for everything and keep everything in its place. This includes clothing, backpacks, and school supplies.  ¨ Encourage writing down assignments and bringing home needed books. Work with your child's teachers for assistance in organizing school work.  · Offer your child a well-balanced diet. Breakfast that includes a balance of whole grains, protein, and fruits or vegetables is especially important for school performance. Children should avoid drinks with caffeine including:  ¨ Soft drinks.  ¨ Coffee.  ¨ Tea.  ¨ However, some older children (adolescents) may find these drinks helpful in improving their attention. Because it can also be common for adolescents with ADHD to become addicted to caffeine, talk with your health care provider about what is a safe amount of caffeine intake for your child.  · Children with ADHD need consistent rules that they can understand and follow. If rules are followed, give small rewards. Children with ADHD often receive, and expect, criticism. Look for good behavior and praise it. Set realistic goals. Give clear instructions. Look for activities that can foster success and self-esteem. Make time for pleasant activities with your child. Give lots of affection.  · Parents are their children's greatest advocates. Learn as much as possible about ADHD. This helps you become a stronger and better advocate for your child. It also helps you educate your child's teachers and instructors  if they feel inadequate in these areas. Parent support groups are often helpful. A national group with local chapters is called Children and Adults with Attention Deficit Hyperactivity Disorder (CHADD).  SEEK MEDICAL CARE IF:  · Your child has repeated muscle twitches, cough, or speech outbursts.  · Your child has sleep problems.  · Your child has a marked loss of appetite.  · Your child develops depression.  · Your child has new or worsening behavioral problems.  · Your child develops dizziness.  · Your child has a racing heart.  · Your child has stomach pains.  · Your child develops headaches.  SEEK IMMEDIATE MEDICAL CARE IF:  · Your child has been diagnosed with depression or anxiety and the symptoms seem to be getting worse.  · Your child has been depressed and suddenly appears to have increased energy or motivation.  · You are worried that your child is having a bad reaction to a medication he or she is taking for ADHD.     This information is not intended to replace advice given to you by your health care provider. Make sure you discuss any questions you have with your   health care provider.     Document Released: 02/02/2002 Document Revised: 02/17/2013 Document Reviewed: 10/20/2012  Elsevier Interactive Patient Education ©2016 Elsevier Inc.

## 2015-01-21 NOTE — Progress Notes (Signed)
       Patient: Shirley Rollins Female    DOB: 08/21/1980   34 y.o.   MRN: [id number] Visit Date: 01/21/2015  Today's Provider: Margaretann LovelessJennifer M Burnette, [name]   Chief Complaint  Patient presents with  . Follow-up    ADD   Subjective:    HPI  Shirley LarssonJessica S Rollins is a 34 year old following on her ADD(Attention Deficit Disorder). Last office visit patient was put on Adderall extended release 10 mg to help with concentration. Patient reports that Adderall is like a little miracle pill. Patient is concentrating more at school, grades are better, feels more focused and is sleeping better. Patient currently not taking Chantix.    Allergies  Allergen Reactions  . Penicillin G Anaphylaxis  . Sulfa Antibiotics Other (See Comments)   Previous Medications   AMPHETAMINE-DEXTROAMPHETAMINE (ADDERALL XR) 10 MG 24 HR CAPSULE    Take 1 capsule (10 mg total) by mouth daily.   CHANTIX CONTINUING MONTH PAK 1 MG TABLET        Review of Systems  Constitutional: Negative for fatigue.  Respiratory: Negative.   Cardiovascular: Negative.   Gastrointestinal: Negative.   Neurological: Negative.   Psychiatric/Behavioral: Negative for dysphoric mood and decreased concentration. The patient is not nervous/anxious and is not hyperactive.   All other systems reviewed and are negative.   Social History  Substance Use Topics  . Smoking status: Current Every Day Smoker  . Smokeless tobacco: Not on file     Comment: 5 cigarretes a day.   . Alcohol Use: Yes     Comment: occassional   Objective:   BP 100/60 mmHg  Pulse 98  Temp(Src) 98.8 F (37.1 C) (Oral)  Resp 16  Wt 119 lb 9.6 oz (54.25 kg)  Physical Exam  Constitutional: She appears well-developed and well-nourished. No distress.  Neck: Normal range of motion. Neck supple.  Cardiovascular: Normal rate, regular rhythm and normal heart sounds.  Exam reveals no gallop and no friction rub.   No murmur heard. Pulmonary/Chest: Effort normal and breath  sounds normal. No respiratory distress. She has no wheezes. She has no rales.  Skin: She is not diaphoretic.  Psychiatric: She has a normal mood and affect. Her behavior is normal. Judgment and thought content normal.  Vitals reviewed.       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 3 written prescriptions were given today. I will follow-up with her in 3 months to see how she is doing at this current dose. She is to call the office if she has any worsening symptoms, side effects, questions or concerns. - amphetamine-dextroamphetamine (ADDERALL XR) 10 MG 24 hr capsule; Take 1 capsule (10 mg total) by mouth daily. Please do NOT fill until 03/22/15  Dispense: 30 capsule; Refill: 0       Shirley LovelessJennifer M Burnette, [name]  [facility]

## 2015-03-16 ENCOUNTER — Other Ambulatory Visit: Payer: Self-pay | Admitting: Physician Assistant

## 2015-03-16 NOTE — Telephone Encounter (Signed)
Needs appt for increase so symptoms can be documented. Thanks.

## 2015-03-16 NOTE — Telephone Encounter (Signed)
LMTCB  Thanks,  -Krystiana Fornes 

## 2015-03-16 NOTE — Telephone Encounter (Signed)
Pt is requesting a increase with her Rx amphetamine-dextroamphetamine (ADDERALL XR) 10 MG 24 hr capsule.  Pt states she is back in school and by 3:00 she is having a hard time staying focused.  [id number]#[phone]/[id number]

## 2015-03-17 NOTE — Telephone Encounter (Signed)
No answer  Thanks,  -Thinh Cuccaro 

## 2015-03-18 NOTE — Telephone Encounter (Signed)
Patient has appointment scheduled for January 25.  Thanks,  -Camdon Saetern

## 2015-03-23 ENCOUNTER — Ambulatory Visit: Payer: BLUE CROSS/BLUE SHIELD | Admitting: Physician Assistant

## 2015-03-30 ENCOUNTER — Encounter: Payer: Self-pay | Admitting: Physician Assistant

## 2015-03-30 ENCOUNTER — Ambulatory Visit (INDEPENDENT_AMBULATORY_CARE_PROVIDER_SITE_OTHER): Payer: BLUE CROSS/BLUE SHIELD | Admitting: Physician Assistant

## 2015-03-30 VITALS — BP 100/70 | HR 96 | Temp 97.2°F | Resp 16 | Wt 118.2 lb

## 2015-03-30 DIAGNOSIS — F900 Attention-deficit hyperactivity disorder, predominantly inattentive type: Secondary | ICD-10-CM

## 2015-03-30 DIAGNOSIS — F988 Other specified behavioral and emotional disorders with onset usually occurring in childhood and adolescence: Secondary | ICD-10-CM

## 2015-03-30 DIAGNOSIS — J0110 Acute frontal sinusitis, unspecified: Secondary | ICD-10-CM | POA: Diagnosis not present

## 2015-03-30 MED ORDER — AZITHROMYCIN 250 MG PO TABS
ORAL_TABLET | ORAL | Status: DC
Start: 2015-03-30 — End: 2015-05-18

## 2015-03-30 MED ORDER — AMPHETAMINE-DEXTROAMPHET ER 20 MG PO CP24: 20.0000 mg | ORAL_CAPSULE | Freq: Every day | ORAL | Status: DC

## 2015-03-30 NOTE — Progress Notes (Signed)
Patient: Shirley Rollins Female    DOB: [birth year]   35 y.o.   MRN: [id number] Visit Date: 03/30/2015  Today's Provider: [name], [name]   Chief Complaint  Patient presents with  . Follow-up    ADD   Subjective:    HPI  Shirley Rollins is here to follow-up on her ADD medication. Patient wants her Adderall increase. She says she needs the medicine to focus more on school. She state the dosage she is on now doesn't help through the whole day. She states her schedule is really crazy. 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 She also is preparing for an internship. She also has been dealing with family stress secondary to her grandfather's health. She states that he recently had his third heart attack in his in [place] and they're trying to get him placed at the [place] in [place]. She is also concern about a cold she has been having for the past two weeks and is unchanged, she has congestion, runny nose with green mucus, postnasal drip and sneezing. No fever.     Allergies  Allergen Reactions  . Penicillin G Anaphylaxis  . Sulfa Antibiotics Other (See Comments)   Previous Medications   AMPHETAMINE-DEXTROAMPHETAMINE (ADDERALL XR) 10 MG 24 HR CAPSULE    Take 1 capsule (10 mg total) by mouth daily. Please do NOT fill until 03/22/15   CHANTIX CONTINUING MONTH PAK 1 MG TABLET    Reported on 03/30/2015    Review of Systems  Constitutional: Positive for fatigue.  HENT: Positive for congestion, postnasal drip, rhinorrhea and sneezing. Negative for ear pain, sinus pressure and sore throat.   Respiratory: Positive for cough (sound like a "barky cough"). Negative for chest tightness, shortness of breath and wheezing.   Cardiovascular: Negative for chest pain, palpitations and leg swelling.  Gastrointestinal: Negative for nausea, vomiting and abdominal pain.  Neurological: Positive for  headaches. Negative for dizziness.  Psychiatric/Behavioral: Positive for decreased concentration (improved slightly but not completely).    Social History  Substance Use Topics  . Smoking status: Current Every Day Smoker  . Smokeless tobacco: Not on file     Comment: 5 cigarretes a day.   . Alcohol Use: Yes     Comment: occassional   Objective:   BP 100/70 mmHg  Pulse 96  Temp(Src) 97.2 F (36.2 C) (Oral)  Resp 16  Wt 118 lb 3.2 oz (53.615 kg)  SpO2 97%  Physical Exam  Constitutional: She appears well-developed and well-nourished. No distress.  HENT:  Head: Normocephalic and atraumatic.  Right Ear: Tympanic membrane, external ear and ear canal normal.  Left Ear: Tympanic membrane, external ear and ear canal normal.  Nose: Mucosal edema and rhinorrhea present. Right sinus exhibits frontal sinus tenderness. Right sinus exhibits no maxillary sinus tenderness. Left sinus exhibits frontal sinus tenderness. Left sinus exhibits no maxillary sinus tenderness.  Mouth/Throat: Uvula is midline, oropharynx is clear and moist and mucous membranes are normal. No oropharyngeal exudate, posterior oropharyngeal edema or posterior oropharyngeal erythema.  Neck: Normal range of motion. Neck supple. No tracheal deviation present. No thyromegaly present.  Cardiovascular: Normal rate, regular rhythm and normal heart sounds.  Exam reveals no gallop and no friction rub.   No murmur heard. Pulmonary/Chest: Effort normal and breath sounds normal. No respiratory distress. She has no wheezes. She has no rales.  Lymphadenopathy:    She has no cervical adenopathy.  Skin: She is not diaphoretic.  Psychiatric: She has a normal mood and affect. Her behavior is normal. Judgment and thought content normal.  Vitals reviewed.       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 She did just recently refill her Adderall 10 mg. I told her  to finish her Adderall 10 mg prescription that she just had filled and to get the Adderall 20 mg filled at her next scheduled time. She voiced understanding and agrees to do so. I will see her back in 2 months to see how she is doing with the increased dose. - amphetamine-dextroamphetamine (ADDERALL XR) 20 MG 24 hr capsule; Take 1 capsule (20 mg total) by mouth daily.  Dispense: 30 capsule; Refill: 0  2. Acute frontal sinusitis, recurrence not specified Worsening symptoms over the last 2 weeks. I will give azithromycin as below. She needs to make sure to stay well-hydrated and try to get plenty of rest. She is to call the office if symptoms fail to improve or worsen. - azithromycin (ZITHROMAX) 250 MG tablet; Take 2 tabs PO on day 1 and 1 tab PO daily thereafter  Dispense: 6 tablet; Refill: [name] M Saadia Dewitt, [name]  [facility]

## 2015-03-30 NOTE — Patient Instructions (Signed)

## 2015-04-25 ENCOUNTER — Ambulatory Visit: Payer: BLUE CROSS/BLUE SHIELD | Admitting: Physician Assistant

## 2015-05-18 ENCOUNTER — Encounter: Payer: Self-pay | Admitting: Physician Assistant

## 2015-05-18 ENCOUNTER — Ambulatory Visit (INDEPENDENT_AMBULATORY_CARE_PROVIDER_SITE_OTHER): Payer: BLUE CROSS/BLUE SHIELD | Admitting: Physician Assistant

## 2015-05-18 VITALS — BP 102/68 | HR 76 | Temp 98.4°F | Resp 14 | Wt 119.2 lb

## 2015-05-18 DIAGNOSIS — F900 Attention-deficit hyperactivity disorder, predominantly inattentive type: Secondary | ICD-10-CM | POA: Diagnosis not present

## 2015-05-18 DIAGNOSIS — F988 Other specified behavioral and emotional disorders with onset usually occurring in childhood and adolescence: Secondary | ICD-10-CM

## 2015-05-18 MED ORDER — AMPHETAMINE-DEXTROAMPHET ER 20 MG PO CP24: 20.0000 mg | ORAL_CAPSULE | Freq: Every day | ORAL | Status: DC

## 2015-05-18 NOTE — Progress Notes (Signed)
Patient ID: Shirley Rollins, female   DOB: 05/07/1980, 35 y.o.   MRN: [id number]   Patient: Shirley LarssonJessica S Rollins Female    DOB: 06/08/1980   35 y.o.   MRN: [id number] Visit Date: 05/18/2015  Today's Provider: Margaretann LovelessJennifer M Burnette, [name]   Chief Complaint  Patient presents with  . ADD  . Follow-up   Subjective:    HPI Patient presents to follow up from increasing Adderall XR to 20 mg one month ago. Patient reports medication is helping with ADD (Attention Deficit Disorder) symptoms with no side effects.   She has been trying to quit smoking as well. She was previously trying chantix but decided she was not quite ready to quit at this point. 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  She graduates in May.   Previous Medications   AMPHETAMINE-DEXTROAMPHETAMINE (ADDERALL XR) 20 MG 24 HR CAPSULE    Take 1 capsule (20 mg total) by mouth daily.   CHANTIX CONTINUING MONTH PAK 1 MG TABLET    Reported on 05/18/2015   Allergies  Allergen Reactions  . Penicillin G Anaphylaxis  . Sulfa Antibiotics Other (See Comments)    Review of Systems  Constitutional: Negative.   HENT: Negative.   Eyes: Negative.   Respiratory: Negative.   Cardiovascular: Negative.   Gastrointestinal: Negative.   Endocrine: Negative.   Genitourinary: Negative.   Musculoskeletal: Negative.   Skin: Negative.   Allergic/Immunologic: Negative.   Neurological: Negative.   Hematological: Negative.   Psychiatric/Behavioral: Positive for decreased concentration.    Social History  Substance Use Topics  . Smoking status: Current Every Day Smoker  . Smokeless tobacco: Not on file     Comment: 5 cigarretes a day.   . Alcohol Use: Yes     Comment: occassional   Objective:   BP 102/68 mmHg  Pulse 76  Temp(Src) 98.4 F (36.9 C) (Oral)  Resp 14  Wt 119 lb 3.2 oz (54.069 kg)  LMP 04/27/2015  Physical Exam  Constitutional: She appears well-developed and well-nourished. No distress.    Cardiovascular: Normal rate, regular rhythm and normal heart sounds.  Exam reveals no gallop and no friction rub.   No murmur heard. Pulmonary/Chest: Effort normal and breath sounds normal. No respiratory distress. She has no wheezes. She has no rales.  Skin: She is not diaphoretic.  Psychiatric: She has a normal mood and affect. Her behavior is normal. Judgment and thought content normal.  Vitals reviewed.       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 She was given 3 months Rx to cover March, April and May. I will see her back in 3 months for refills and to discuss smoking cessation at that time.  She is to call if she has any questions, concerns or acute issues.  - amphetamine-dextroamphetamine (ADDERALL XR) 20 MG 24 hr capsule; Take 1 capsule (20 mg total) by mouth daily. Please do NOT fill until 07/17/15  Dispense: 30 capsule; Refill: 0   Follow up: No Follow-up on file.

## 2015-05-18 NOTE — Patient Instructions (Signed)
Attention Deficit Hyperactivity Disorder  Attention deficit hyperactivity disorder (ADHD) is a problem with behavior issues based on the way the brain functions (neurobehavioral disorder). It is a common reason for behavior and academic problems in school.  SYMPTOMS   There are 3 types of ADHD. The 3 types and some of the symptoms include:  · Inattentive.    Gets bored or distracted easily.    Loses or forgets things. Forgets to hand in homework.    Has trouble organizing or completing tasks.    Difficulty staying on task.    An inability to organize daily tasks and school work.    Leaving projects, chores, or homework unfinished.    Trouble paying attention or responding to details. Careless mistakes.    Difficulty following directions. Often seems like is not listening.    Dislikes activities that require sustained attention (like chores or homework).  · Hyperactive-impulsive.    Feels like it is impossible to sit still or stay in a seat. Fidgeting with hands and feet.    Trouble waiting turn.    Talking too much or out of turn. Interruptive.    Speaks or acts impulsively.    Aggressive, disruptive behavior.    Constantly busy or on the go; noisy.    Often leaves seat when they are expected to remain seated.    Often runs or climbs where it is not appropriate, or feels very restless.  · Combined.    Has symptoms of both of the above.  Often children with ADHD feel discouraged about themselves and with school. They often perform well below their abilities in school.  As children get older, the excess motor activities can calm down, but the problems with paying attention and staying organized persist. Most children do not outgrow ADHD but with good treatment can learn to cope with the symptoms.  DIAGNOSIS   When ADHD is suspected, the diagnosis should be made by professionals trained in ADHD. This professional will collect information about the individual suspected of having ADHD. Information must be collected from  various settings where the person lives, works, or attends school.    Diagnosis will include:  · Confirming symptoms began in childhood.  · Ruling out other reasons for the child's behavior.  · The health care providers will check with the child's school and check their medical records.  · They will talk to teachers and parents.  · Behavior rating scales for the child will be filled out by those dealing with the child on a daily basis.  A diagnosis is made only after all information has been considered.  TREATMENT   Treatment usually includes behavioral treatment, tutoring or extra support in school, and stimulant medicines. Because of the way a person's brain works with ADHD, these medicines decrease impulsivity and hyperactivity and increase attention. This is different than how they would work in a person who does not have ADHD. Other medicines used include antidepressants and certain blood pressure medicines.  Most experts agree that treatment for ADHD should address all aspects of the person's functioning. Along with medicines, treatment should include structured classroom management at school. Parents should reward good behavior, provide constant discipline, and set limits. Tutoring should be available for the child as needed.  ADHD is a lifelong condition. If untreated, the disorder can have long-term serious effects into adolescence and adulthood.  HOME CARE INSTRUCTIONS   · Often with ADHD there is a lot of frustration among family members dealing with the condition. Blame   and anger are also feelings that are common. In many cases, because the problem affects the family as a whole, the entire family may need help. A therapist can help the family find better ways to handle the disruptive behaviors of the person with ADHD and promote change. If the person with ADHD is young, most of the therapist's work is with the parents. Parents will learn techniques for coping with and improving their child's behavior.  Sometimes only the child with the ADHD needs counseling. Your health care providers can help you make these decisions.  · Children with ADHD may need help learning how to organize. Some helpful tips include:  ¨ Keep routines the same every day from wake-up time to bedtime. Schedule all activities, including homework and playtime. Keep the schedule in a place where the person with ADHD will often see it. Mark schedule changes as far in advance as possible.  ¨ Schedule outdoor and indoor recreation.  ¨ Have a place for everything and keep everything in its place. This includes clothing, backpacks, and school supplies.  ¨ Encourage writing down assignments and bringing home needed books. Work with your child's teachers for assistance in organizing school work.  · Offer your child a well-balanced diet. Breakfast that includes a balance of whole grains, protein, and fruits or vegetables is especially important for school performance. Children should avoid drinks with caffeine including:  ¨ Soft drinks.  ¨ Coffee.  ¨ Tea.  ¨ However, some older children (adolescents) may find these drinks helpful in improving their attention. Because it can also be common for adolescents with ADHD to become addicted to caffeine, talk with your health care provider about what is a safe amount of caffeine intake for your child.  · Children with ADHD need consistent rules that they can understand and follow. If rules are followed, give small rewards. Children with ADHD often receive, and expect, criticism. Look for good behavior and praise it. Set realistic goals. Give clear instructions. Look for activities that can foster success and self-esteem. Make time for pleasant activities with your child. Give lots of affection.  · Parents are their children's greatest advocates. Learn as much as possible about ADHD. This helps you become a stronger and better advocate for your child. It also helps you educate your child's teachers and instructors  if they feel inadequate in these areas. Parent support groups are often helpful. A national group with local chapters is called Children and Adults with Attention Deficit Hyperactivity Disorder (CHADD).  SEEK MEDICAL CARE IF:  · Your child has repeated muscle twitches, cough, or speech outbursts.  · Your child has sleep problems.  · Your child has a marked loss of appetite.  · Your child develops depression.  · Your child has new or worsening behavioral problems.  · Your child develops dizziness.  · Your child has a racing heart.  · Your child has stomach pains.  · Your child develops headaches.  SEEK IMMEDIATE MEDICAL CARE IF:  · Your child has been diagnosed with depression or anxiety and the symptoms seem to be getting worse.  · Your child has been depressed and suddenly appears to have increased energy or motivation.  · You are worried that your child is having a bad reaction to a medication he or she is taking for ADHD.     This information is not intended to replace advice given to you by your health care provider. Make sure you discuss any questions you have with your   health care provider.     Document Released: 02/02/2002 Document Revised: 02/17/2013 Document Reviewed: 10/20/2012  Elsevier Interactive Patient Education ©2016 Elsevier Inc.

## 2015-06-01 ENCOUNTER — Ambulatory Visit: Payer: BLUE CROSS/BLUE SHIELD | Admitting: Physician Assistant

## 2015-08-11 ENCOUNTER — Ambulatory Visit: Payer: BLUE CROSS/BLUE SHIELD | Admitting: Physician Assistant

## 2015-08-12 ENCOUNTER — Encounter: Payer: Self-pay | Admitting: Physician Assistant

## 2015-08-12 ENCOUNTER — Ambulatory Visit (INDEPENDENT_AMBULATORY_CARE_PROVIDER_SITE_OTHER): Payer: BLUE CROSS/BLUE SHIELD | Admitting: Physician Assistant

## 2015-08-12 VITALS — BP 110/60 | HR 99 | Temp 98.2°F | Resp 16 | Wt 115.6 lb

## 2015-08-12 DIAGNOSIS — F988 Other specified behavioral and emotional disorders with onset usually occurring in childhood and adolescence: Secondary | ICD-10-CM

## 2015-08-12 DIAGNOSIS — F4321 Adjustment disorder with depressed mood: Secondary | ICD-10-CM | POA: Diagnosis not present

## 2015-08-12 DIAGNOSIS — F900 Attention-deficit hyperactivity disorder, predominantly inattentive type: Secondary | ICD-10-CM

## 2015-08-12 MED ORDER — AMPHETAMINE-DEXTROAMPHET ER 20 MG PO CP24
20.0000 mg | ORAL_CAPSULE | Freq: Every day | ORAL | Status: DC
Start: 2015-08-12 — End: 2015-08-12

## 2015-08-12 MED ORDER — AMPHETAMINE-DEXTROAMPHET ER 20 MG PO CP24: 20.0000 mg | ORAL_CAPSULE | Freq: Every day | ORAL | Status: DC

## 2015-08-12 MED ORDER — BUPROPION HCL ER (XL) 150 MG PO TB24: 150.0000 mg | ORAL_TABLET | Freq: Every day | ORAL | Status: DC

## 2015-08-12 NOTE — Patient Instructions (Signed)
Complicated Grieving Grief is a normal response to the death of someone close to you. Feelings of fear, anger, and guilt can affect almost everyone who loses a loved one. It is also common to have symptoms of depression while you are grieving. These include problems with sleep, loss of appetite, and lack of energy. They may last for weeks or months after a loss. Complicated grief is different from normal grief or depression. Normal grieving involves sadness and feelings of loss, but these feelings are not constant. Complicated grief is a constant and severe type of grief. It interferes with your ability to function normally. It may last for several months to a year or longer. Complicated grief may require treatment from a mental health care provider. CAUSES  It is not known why some people continue to struggle with grief and others do not. You may be at higher risk for complicated grief if:  The death of your loved one was sudden or unexpected.  The death of your loved one was due to a violent event.  Your loved one committed suicide.  Your loved one was a child or a young person.  You were very close to or dependent on the loved one.  You have a history of depression. SIGNS AND SYMPTOMS Signs and symptoms of complicated grief may include:  Feeling disbelief or numbness.  Being unable to enjoy good memories of your loved one.  Needing to avoid anything that reminds you of your loved one.  Being unable to stop thinking about the death.  Feeling intense anger or guilt.  Feeling alone and hopeless.  Feeling that your life is meaningless and empty.  Losing the desire to live. DIAGNOSIS Your health care provider may diagnose complicated grief if:  You have constant symptoms of grief for 6-12 months or longer.  Your symptoms are interfering with your ability to live your life. Your health care provider may want you to see a mental health care provider. Many symptoms of depression  are similar to the symptoms of complicated grief. It is important to be evaluated for complicated grief along with other mental health conditions. TREATMENT  Talk therapy with a mental health provider is the most common treatment for complicated grief. During therapy, you will learn healthy ways to cope with the loss of your loved one. In some cases, your mental health care provider may also recommend antidepressant medicines. HOME CARE INSTRUCTIONS  Take care of yourself.  Eat regular meals and maintain a healthy diet. Eat plenty of fruits, vegetables, and whole grains.  Try to get some exercise each day.  Keep regular hours for sleep. Try to get at least 8 hours of sleep each night.  Do not use drugs or alcohol to ease your symptoms.  Take medicines only as directed by your health care provider.  Spend time with friends and loved ones.  Consider joining a grief (bereavement) support group to help you deal with your loss.  Keep all follow-up visits as directed by your health care provider. This is important. SEEK MEDICAL CARE IF:  Your symptoms keep you from functioning normally.  Your symptoms do not get better with treatment. SEEK IMMEDIATE MEDICAL CARE IF:  You have serious thoughts of hurting yourself or someone else.  You have suicidal feelings.   This information is not intended to replace advice given to you by your health care provider. Make sure you discuss any questions you have with your health care provider.   Document Released: 02/12/2005   Document Revised: 11/03/2014 Document Reviewed: 07/23/2013 Elsevier Interactive Patient Education 2016 Elsevier Inc.  

## 2015-08-12 NOTE — Progress Notes (Signed)
Patient: Shirley Rollins Female    DOB: 11/15/1980   35 y.o.   MRN: [id number] Visit Date: 08/12/2015  Today's Provider: Margaretann LovelessJennifer M Charlynn Salih, [name]   Chief Complaint  Patient presents with  . Follow-up    ADHD   Subjective:    HPI Shirley Rollins is here to follow-up on ADHD. She was stable 3 months ago. Taking Adderall XR 20 MG 24 hr capsule. She lost a close friend a month ago. She decided not to take the Adderall during this process but it only lasted for three days of not taking it because she started to feel bad. Overall with her ADHD she is doing well.  She is very teary in office and she reports struggling with all of this after losing her friend. This was an old boyfriend that she had dated for 5 years. They had been separated for the last 3 years but had stayed in contact. They had plans of getting back together once the timing was better and she was finished with school. She feels that things are getting back to normal but just feels like crying and she reports that she understand the grief process. She also has a friend that is a Veterinary surgeoncounselor that is helping her out also.  She is not taking chantix at this time due to fear of dreams she may have with this acute grieving process ongoing as well.     Allergies  Allergen Reactions  . Penicillin G Anaphylaxis  . Sulfa Antibiotics Other (See Comments)   Current Meds  Medication Sig  . amphetamine-dextroamphetamine (ADDERALL XR) 20 MG 24 hr capsule Take 1 capsule (20 mg total) by mouth daily. Please do NOT fill until 07/17/15    Review of Systems  Constitutional: Negative.   Respiratory: Negative.   Cardiovascular: Negative for chest pain, palpitations and leg swelling.  Gastrointestinal: Negative.   Psychiatric/Behavioral: Positive for dysphoric mood. Negative for suicidal ideas, sleep disturbance, self-injury, decreased concentration and agitation. The patient is nervous/anxious.     Social History  Substance Use  Topics  . Smoking status: Current Every Day Smoker  . Smokeless tobacco: Not on file     Comment: 5 cigarretes a day.   . Alcohol Use: Yes     Comment: occassional   Objective:   BP 110/60 mmHg  Pulse 99  Temp(Src) 98.2 F (36.8 C) (Oral)  Resp 16  Wt 115 lb 9.6 oz (52.436 kg)  LMP 07/20/2015  Physical Exam  Constitutional: She appears well-developed and well-nourished. No distress.  Cardiovascular: Normal rate, regular rhythm and normal heart sounds.  Exam reveals no gallop and no friction rub.   No murmur heard. Pulmonary/Chest: Effort normal and breath sounds normal. No respiratory distress. She has no wheezes. She has no rales.  Skin: She is not diaphoretic.  Psychiatric: Her speech is normal and behavior is normal. Judgment and thought content normal. Cognition and memory are normal. She exhibits a depressed mood.  Vitals reviewed.       Assessment & Plan:     1. Grief reaction Will add wellbutrin as below. Hoping she may get the smoking cessation benefit as well. She is to call if she feels she needs to increase to the 150mg SR BID instead.  - buPROPion (WELLBUTRIN XL) 150 MG 24 hr tablet; Take 1 tablet (150 mg total) by mouth daily.  Dispense: 30 tablet; Refill: 3  2. Situational depression See above medical treatment plan. - buPROPion (WELLBUTRIN XL) 150  MG 24 hr tablet; Take 1 tablet (150 mg total) by mouth daily.  Dispense: 30 tablet; Refill: 3  3. 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 Diagnosis pulled for medication refill. Continue current medical treatment plan. 3 months given with last to be filled 10/13/15. - amphetamine-dextroamphetamine (ADDERALL XR) 20 MG 24 hr capsule; Take 1 capsule (20 mg total) by mouth daily.  Dispense: 30 capsule; Refill: 0       [name], [name]  [facility]

## 2015-08-18 ENCOUNTER — Ambulatory Visit: Payer: BLUE CROSS/BLUE SHIELD | Admitting: Physician Assistant

## 2015-10-14 ENCOUNTER — Encounter: Payer: Self-pay | Admitting: Physician Assistant

## 2015-10-14 ENCOUNTER — Ambulatory Visit (INDEPENDENT_AMBULATORY_CARE_PROVIDER_SITE_OTHER): Payer: BLUE CROSS/BLUE SHIELD | Admitting: Physician Assistant

## 2015-10-14 VITALS — BP 108/68 | HR 61 | Temp 98.3°F | Resp 14 | Wt 115.0 lb

## 2015-10-14 DIAGNOSIS — F900 Attention-deficit hyperactivity disorder, predominantly inattentive type: Secondary | ICD-10-CM

## 2015-10-14 DIAGNOSIS — Z111 Encounter for screening for respiratory tuberculosis: Secondary | ICD-10-CM

## 2015-10-14 DIAGNOSIS — F988 Other specified behavioral and emotional disorders with onset usually occurring in childhood and adolescence: Secondary | ICD-10-CM

## 2015-10-14 DIAGNOSIS — Z0189 Encounter for other specified special examinations: Secondary | ICD-10-CM

## 2015-10-14 DIAGNOSIS — Z1159 Encounter for screening for other viral diseases: Secondary | ICD-10-CM | POA: Diagnosis not present

## 2015-10-14 DIAGNOSIS — Z8619 Personal history of other infectious and parasitic diseases: Secondary | ICD-10-CM | POA: Diagnosis not present

## 2015-10-14 DIAGNOSIS — Z0283 Encounter for blood-alcohol and blood-drug test: Secondary | ICD-10-CM

## 2015-10-14 MED ORDER — AMPHETAMINE-DEXTROAMPHET ER 20 MG PO CP24: 20.0000 mg | ORAL_CAPSULE | Freq: Every day | ORAL | 0 refills | Status: DC

## 2015-10-14 NOTE — Progress Notes (Signed)
   Patient: Shirley Rollins Female    DOB: 06/08/1980   35 y.o.   MRN: [id number] Visit Date: 10/14/2015  Today's Provider: Margaretann LovelessJennifer M Stela Iwasaki, [name]   Chief Complaint  Patient presents with  . Labs Only   Subjective:    HPI Patient is requesting a varicella titer,12 panel drug screen and TB test for school. 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 She has been filling out applications [facility] [employer] [facility] [employer]. These things are required for her applications.   Previous Medications   AMPHETAMINE-DEXTROAMPHETAMINE (ADDERALL XR) 20 MG 24 HR CAPSULE    Take 1 capsule (20 mg total) by mouth daily.    Review of Systems  Constitutional: Negative.   Respiratory: Negative.   Cardiovascular: Negative.   Gastrointestinal: Negative.   Neurological: Negative.   Psychiatric/Behavioral: Negative.     Social History  Substance Use Topics  . Smoking status: Current Every Day Smoker  . Smokeless tobacco: Never Used     Comment: 5 cigarretes a day.   . Alcohol use Yes     Comment: occassional   Objective:   BP 108/68 (BP Location: Right Arm, Patient Position: Sitting, Cuff Size: Normal)   Pulse 61   Temp 98.3 F (36.8 C) (Oral)   Resp 14   Wt 115 lb (52.2 kg)   BMI 18.56 kg/m   Physical Exam  Constitutional: She appears well-developed and well-nourished. No distress.  Cardiovascular: Normal rate, regular rhythm and normal heart sounds.  Exam reveals no gallop and no friction rub.   No murmur heard. Pulmonary/Chest: Effort normal and breath sounds normal. No respiratory distress. She has no wheezes. She has no rales.  Skin: She is not diaphoretic.  Psychiatric: She has a normal mood and affect. Her behavior is normal. Judgment and thought content normal.  Vitals reviewed.     Assessment & Plan:     1. Need for hepatitis B screening test Will check labs as below and f/u pending results. Will fax results to [employer] [facility] of  Nutrition per patient request. She did fill out medical release of information.  - Hepatitis B surface antibody - Hepatitis B surface antibody  2. Screening for tuberculosis Will check labs as below and f/u pending results. - Quantiferon tb gold assay (blood)  3. H/O varicella Will check labs as below and f/u pending results. - Varicella Zoster Abs, IgG/IgM  4. Encounter for drug screening Will check labs as below and f/u pending results. - Drug Screen, Ur (12+Oxycodone+Crt)  5. ADD (attention deficit disorder) without hyperactivity Stable on current dose. Refilled x 3. Will not need refills until 01/11/16. Last Rx was to be filled no sooner than 12/12/15 as below. She is to call when she needs her new Rx. A letter was also written for the [facility] [facility] Nutrition stating she is taking Adderall under my supervision.  - amphetamine-dextroamphetamine (ADDERALL XR) 20 MG 24 hr capsule; Take 1 capsule (20 mg total) by mouth daily. Please do NOT fill until 12/12/15  Dispense: 30 capsule; Refill: 0   Follow up: No Follow-up on file.

## 2015-10-17 ENCOUNTER — Telehealth: Payer: Self-pay

## 2015-10-17 NOTE — Telephone Encounter (Signed)
Patient advised as directed below. Per patient she can get the Hep B at the health fair she will be attending. She also reminded us to send her letter with the drug screen results to the address she provided.  Thanks  -Joseline

## 2015-10-17 NOTE — Telephone Encounter (Signed)
-----   Message from Margaretann LovelessJennifer M Burnette, [place]C sent at 10/17/2015 11:32 AM EDT ----- Hep B is showing that she is not immune. Without record of any vaccines it may be beneficial to repeat the series. She can schedule an immunization visit for Hep B only to start the series here if she would like. Urine drug screen was normal. Still awaiting varicella and quanteferon gold testing results.

## 2015-10-19 LAB — DRUG SCREEN, UR (12+OXYCODONE+CRT)
AMPHETAMINE SCREEN URINE: NEGATIVE ng/mL
BARBITURATE SCREEN URINE: NEGATIVE ng/mL
BENZODIAZEPINE SCREEN, URINE: NEGATIVE ng/mL
CANNABINOIDS UR QL SCN: NEGATIVE ng/mL
Cocaine (Metab) Scrn, Ur: NEGATIVE ng/mL
Creatinine(Crt), U: 23.2 mg/dL (ref 20.0–300.0)
FENTANYL, URINE: NEGATIVE pg/mL
MEPERIDINE SCREEN, URINE: NEGATIVE ng/mL
Methadone Screen, Urine: NEGATIVE ng/mL
OPIATE SCREEN URINE: NEGATIVE ng/mL
OXYCODONE+OXYMORPHONE UR QL SCN: NEGATIVE ng/mL
PROPOXYPHENE SCREEN URINE: NEGATIVE ng/mL
Ph of Urine: 6.5 (ref 4.5–8.9)
Phencyclidine Qn, Ur: NEGATIVE ng/mL
SPECIFIC GRAVITY: 1.007
Tramadol Screen, Urine: NEGATIVE ng/mL

## 2015-10-19 LAB — QUANTIFERON IN TUBE
QFT TB AG MINUS NIL VALUE: 0.09 IU/mL
QUANTIFERON MITOGEN VALUE: >10 IU/mL
QUANTIFERON TB AG VALUE: 0.3 IU/mL
QUANTIFERON TB GOLD: NEGATIVE
Quantiferon Nil Value: 0.21 IU/mL

## 2015-10-19 LAB — VARICELLA ZOSTER ABS, IGG/IGM
Varicella IgM: <0.91 index (ref 0.00–0.90)
Varicella zoster IgG: 3352 index (ref >165)

## 2015-10-19 LAB — HEPATITIS B SURFACE ANTIBODY, QUANTITATIVE: HEPATITIS B SURF AB QUANT: 3.7 m[IU]/mL — AB

## 2015-10-19 LAB — HEPATITIS B SURFACE ANTIBODY,QUALITATIVE: Hep B Surface Ab, Qual: NONREACTIVE

## 2015-10-19 LAB — QUANTIFERON TB GOLD ASSAY (BLOOD)

## 2015-11-02 ENCOUNTER — Other Ambulatory Visit: Payer: Self-pay | Admitting: Physician Assistant

## 2015-11-02 DIAGNOSIS — Z021 Encounter for pre-employment examination: Secondary | ICD-10-CM

## 2015-11-03 ENCOUNTER — Telehealth: Payer: Self-pay

## 2015-11-03 LAB — MDMA SCREEN, URINE: MDMA SCREEN, URINE: NEGATIVE ng/mL

## 2015-11-03 NOTE — Telephone Encounter (Signed)
LMTCB

## 2015-11-03 NOTE — Telephone Encounter (Signed)
-----   Message from Margaretann LovelessJennifer M Burnette, [place]C sent at 11/03/2015 12:45 PM EDT ----- MDMA urine was negative.

## 2015-11-04 ENCOUNTER — Telehealth: Payer: Self-pay

## 2015-11-04 LAB — D/L METHAMPHETAMINE

## 2015-11-04 NOTE — Telephone Encounter (Signed)
Patient advised as directed below. She reports she wants to come in Monday morning. Patient advised that needs to come and get prescription for the test.

## 2015-11-04 NOTE — Telephone Encounter (Signed)
Pt advised.  She is going to pick up a copy of the results.   Thanks,   -Vernona RiegerLaura

## 2015-11-04 NOTE — Telephone Encounter (Signed)
-----   Message from Margaretann LovelessJennifer M Burnette, [place]C sent at 11/04/2015 12:08 PM EDT ----- Please notify Shanda BumpsJessica that [facility] cancelled the methamphetamine portion due to lack of sample per report.

## 2015-11-04 NOTE — Telephone Encounter (Signed)
Rx written and on my desk. 

## 2015-11-07 ENCOUNTER — Other Ambulatory Visit: Payer: Self-pay | Admitting: Physician Assistant

## 2015-11-08 NOTE — Telephone Encounter (Signed)
Rx was put upfront yesterday morning. Patient was advised Friday that it was going to be ready for to pick Monday morning as she requested.  Thanks,  -Loraine Freid

## 2015-11-09 ENCOUNTER — Telehealth: Payer: Self-pay | Admitting: Physician Assistant

## 2015-11-09 LAB — D/L METHAMPHETAMINE

## 2015-11-09 NOTE — Telephone Encounter (Signed)
Spoke Aram Beechamynthia from Customer Service and she transferred me to outpatient Drug Screening and per Selena BattenKim Quantity was not sufficient for analysis to test D/L Methamphetamine. She reports it shows they only obtained 20 ml of urine which is not enough. Informed her this is twice that it has happened. She gave me the e-mail if the provider wants to sent an e-mail and see what happened since is the second time and patient states gave enough sample. e-mail is: [email or web address]

## 2015-11-09 NOTE — Telephone Encounter (Signed)
Pt calling to check on stats of her labs from Monday I informed pt that I didn't see where her labs where back from Monday. Pt states to please call her when labs results do come in and pt states she would like a copy of results. Thanks CC

## 2015-11-09 NOTE — Telephone Encounter (Signed)
PLEASE REVIEW-AA

## 2015-11-09 NOTE — Telephone Encounter (Signed)
Please review.  Thanks,  -Joseline 

## 2015-11-09 NOTE — Telephone Encounter (Signed)
Joseline this is what we are working on with [facility].

## 2015-11-09 NOTE — Telephone Encounter (Signed)
Patient is going to leave sample with us to send.

## 2015-11-10 NOTE — Progress Notes (Signed)
This is was taken care off with another message that was opened. Spoke with [facility] yesterday and patient was informed.She came to give another sample this time it was sent from our office to avoid any insufficient for analysis.  Thanks,  -Joseline

## 2015-11-14 ENCOUNTER — Telehealth: Payer: Self-pay

## 2015-11-14 NOTE — Telephone Encounter (Signed)
Spoke with Shanda BumpsJessica. She is going to use another company that does drug screens here locally to collect. She will call us if she needs anything more.

## 2015-11-14 NOTE — Telephone Encounter (Signed)
Patient called regarding her lab results. Advised patient that we have not received any result and that I will call [facility] to see how long it would take for the test to get resulted.   FYI: [facility] branch and [facility] spoke with Alcario Droughtrica and Stanton KidneyDebra and another girl forgot her name they all said they didn't see any results or that they had receive any specimen. Last girl that spoke with before [facility] transferred me to the [facility] option for pick up to locate specimen and they transferred me back to customer service with [employer] which out of all the girls she was very pleasant. She reported the same thing but actually stated she was going to send a e-mail to the lab to investigate and that they will get in touch with me.  Thanks,  -Jeniel Slauson

## 2015-11-15 ENCOUNTER — Encounter: Payer: Self-pay | Admitting: Physician Assistant

## 2015-11-21 ENCOUNTER — Ambulatory Visit: Payer: BLUE CROSS/BLUE SHIELD | Admitting: Physician Assistant

## 2015-12-22 ENCOUNTER — Encounter: Payer: Self-pay | Admitting: Physician Assistant

## 2015-12-22 ENCOUNTER — Ambulatory Visit (INDEPENDENT_AMBULATORY_CARE_PROVIDER_SITE_OTHER): Payer: BLUE CROSS/BLUE SHIELD | Admitting: Physician Assistant

## 2015-12-22 VITALS — BP 90/60 | HR 72 | Temp 98.2°F | Resp 16 | Wt 115.2 lb

## 2015-12-22 DIAGNOSIS — R059 Cough, unspecified: Secondary | ICD-10-CM

## 2015-12-22 DIAGNOSIS — R05 Cough: Secondary | ICD-10-CM

## 2015-12-22 DIAGNOSIS — J069 Acute upper respiratory infection, unspecified: Secondary | ICD-10-CM

## 2015-12-22 DIAGNOSIS — J029 Acute pharyngitis, unspecified: Secondary | ICD-10-CM

## 2015-12-22 MED ORDER — DOXYCYCLINE MONOHYDRATE 100 MG PO CAPS
100.0000 mg | ORAL_CAPSULE | Freq: Two times a day (BID) | ORAL | 0 refills | Status: AC
Start: 2015-12-22 — End: 2015-12-29

## 2015-12-22 MED ORDER — HYDROCODONE-HOMATROPINE 5-1.5 MG/5ML PO SYRP: 5.0000 mL | ORAL_SOLUTION | Freq: Four times a day (QID) | ORAL | 0 refills | Status: DC | PRN

## 2015-12-22 NOTE — Patient Instructions (Signed)
Upper Respiratory Infection, Adult Most upper respiratory infections (URIs) are a viral infection of the air passages leading to the lungs. A URI affects the nose, throat, and upper air passages. The most common type of URI is nasopharyngitis and is typically referred to as "the common cold." URIs run their course and usually go away on their own. Most of the time, a URI does not require medical attention, but sometimes a bacterial infection in the upper airways can follow a viral infection. This is called a secondary infection. Sinus and middle ear infections are common types of secondary upper respiratory infections. Bacterial pneumonia can also complicate a URI. A URI can worsen asthma and chronic obstructive pulmonary disease (COPD). Sometimes, these complications can require emergency medical care and may be life threatening.  CAUSES Almost all URIs are caused by viruses. A virus is a type of germ and can spread from one person to another.  RISKS FACTORS You may be at risk for a URI if:   You smoke.   You have chronic heart or lung disease.  You have a weakened defense (immune) system.   You are very young or very old.   You have nasal allergies or asthma.  You work in crowded or poorly ventilated areas.  You work in health care facilities or schools. SIGNS AND SYMPTOMS  Symptoms typically develop 2-3 days after you come in contact with a cold virus. Most viral URIs last 7-10 days. However, viral URIs from the influenza virus (flu virus) can last 14-18 days and are typically more severe. Symptoms may include:   Runny or stuffy (congested) nose.   Sneezing.   Cough.   Sore throat.   Headache.   Fatigue.   Fever.   Loss of appetite.   Pain in your forehead, behind your eyes, and over your cheekbones (sinus pain).  Muscle aches.  DIAGNOSIS  Your health care provider may diagnose a URI by:  Physical exam.  Tests to check that your symptoms are not due to  another condition such as:  Strep throat.  Sinusitis.  Pneumonia.  Asthma. TREATMENT  A URI goes away on its own with time. It cannot be cured with medicines, but medicines may be prescribed or recommended to relieve symptoms. Medicines may help:  Reduce your fever.  Reduce your cough.  Relieve nasal congestion. HOME CARE INSTRUCTIONS   Take medicines only as directed by your health care provider.   Gargle warm saltwater or take cough drops to comfort your throat as directed by your health care provider.  Use a warm mist humidifier or inhale steam from a shower to increase air moisture. This may make it easier to breathe.  Drink enough fluid to keep your urine clear or pale yellow.   Eat soups and other clear broths and maintain good nutrition.   Rest as needed.   Return to work when your temperature has returned to normal or as your health care provider advises. You may need to stay home longer to avoid infecting others. You can also use a face mask and careful hand washing to prevent spread of the virus.  Increase the usage of your inhaler if you have asthma.   Do not use any tobacco products, including cigarettes, chewing tobacco, or electronic cigarettes. If you need help quitting, ask your health care provider. PREVENTION  The best way to protect yourself from getting a cold is to practice good hygiene.   Avoid oral or hand contact with people with cold   symptoms.   Wash your hands often if contact occurs.  There is no clear evidence that vitamin C, vitamin E, echinacea, or exercise reduces the chance of developing a cold. However, it is always recommended to get plenty of rest, exercise, and practice good nutrition.  SEEK MEDICAL CARE IF:   You are getting worse rather than better.   Your symptoms are not controlled by medicine.   You have chills.  You have worsening shortness of breath.  You have brown or red mucus.  You have yellow or brown nasal  discharge.  You have pain in your face, especially when you bend forward.  You have a fever.  You have swollen neck glands.  You have pain while swallowing.  You have white areas in the back of your throat. SEEK IMMEDIATE MEDICAL CARE IF:   You have severe or persistent:  Headache.  Ear pain.  Sinus pain.  Chest pain.  You have chronic lung disease and any of the following:  Wheezing.  Prolonged cough.  Coughing up blood.  A change in your usual mucus.  You have a stiff neck.  You have changes in your:  Vision.  Hearing.  Thinking.  Mood. MAKE SURE YOU:   Understand these instructions.  Will watch your condition.  Will get help right away if you are not doing well or get worse.   This information is not intended to replace advice given to you by your health care provider. Make sure you discuss any questions you have with your health care provider.   Document Released: 08/08/2000 Document Revised: 06/29/2014 Document Reviewed: 05/20/2013 Elsevier Interactive Patient Education 2016 Elsevier Inc.  

## 2015-12-22 NOTE — Progress Notes (Signed)
Patient: Shirley EMMONS Female    DOB: 03-03-1980   35 y.o.   MRN: [id number] Visit Date: 12/22/2015  Today's Provider: [name], [name]   Chief Complaint  Patient presents with  . URI   Subjective:    URI   This is a new problem. The current episode started 1 to 4 weeks ago. The problem has been gradually worsening. There has been no fever. Associated symptoms include congestion, coughing (coughing up phlegm-yellowish-green color.), headaches and rhinorrhea (Yellow color). Pertinent negatives include no ear pain, sore throat or wheezing. Associated symptoms comments: body aches. She has tried decongestant (Advil Cold Sinus and Nyquil) for the symptoms. The treatment provided no relief.      Allergies  Allergen Reactions  . Penicillin G Anaphylaxis  . Sulfa Antibiotics Other (See Comments)     Current Outpatient Prescriptions:  .  amphetamine-dextroamphetamine (ADDERALL XR) 20 MG 24 hr capsule, Take 1 capsule (20 mg total) by mouth daily. Please do NOT fill until 12/12/15, Disp: 30 capsule, Rfl: 0  Review of Systems  Constitutional: Positive for chills. Negative for fever.  HENT: Positive for congestion, postnasal drip and rhinorrhea (Yellow color). Negative for ear pain and sore throat.   Eyes: Negative.   Respiratory: Positive for cough (coughing up phlegm-yellowish-green color.). Negative for chest tightness, shortness of breath and wheezing.   Cardiovascular: Negative.   Gastrointestinal: Negative.   Neurological: Positive for headaches. Negative for dizziness.  Psychiatric/Behavioral: The patient is nervous/anxious.     Social History  Substance Use Topics  . Smoking status: Current Every Day Smoker  . Smokeless tobacco: Never Used     Comment: 5 cigarretes a day.   . Alcohol use Yes     Comment: occassional   Objective:   BP 90/60 (BP Location: Right Arm, Patient Position: Sitting, Cuff Size: Normal)   Pulse 72   Temp 98.2 F (36.8 C) (Oral)    Resp 16   Wt 115 lb 3.2 oz (52.3 kg)   LMP 12/20/2015   SpO2 100%   BMI 18.59 kg/m   Physical Exam  Constitutional: She appears well-developed and well-nourished. No distress.  HENT:  Head: Normocephalic and atraumatic.  Right Ear: Hearing, external ear and ear canal normal. A middle ear effusion is present.  Left Ear: Hearing, tympanic membrane, external ear and ear canal normal.  Nose: Nose normal.  Mouth/Throat: Uvula is midline and mucous membranes are normal. Posterior oropharyngeal erythema present. No oropharyngeal exudate or posterior oropharyngeal edema.  Eyes: Conjunctivae are normal. Pupils are equal, round, and reactive to light. Right eye exhibits no discharge. Left eye exhibits no discharge. No scleral icterus.  Neck: Normal range of motion. Neck supple. No tracheal deviation present. No thyromegaly present.  Cardiovascular: Normal rate, regular rhythm and normal heart sounds.  Exam reveals no gallop and no friction rub.   No murmur heard. Pulmonary/Chest: Effort normal and breath sounds normal. No stridor. No respiratory distress. She has no wheezes. She has no rales.  Lymphadenopathy:    She has no cervical adenopathy.  Skin: Skin is warm and dry. She is not diaphoretic.  Vitals reviewed.     Assessment & Plan:     1. Upper respiratory tract infection, unspecified type Worsening symptoms that have not responded to OTC medications. Will give doxycycline as below. May use mucinex for congestion. Stay well hydrated and get plenty of rest. Call if no symptom improvement or if symptoms worsen. - doxycycline (MONODOX) 100  MG capsule; Take 1 capsule (100 mg total) by mouth 2 (two) times daily.  Dispense: 14 capsule; Refill: 0  2. Sore throat See above medical treatment plan.  3. Cough Worsening symptoms that has not responded to OTC medications. Will give Hycodan cough syrup as below for nighttime cough. Drowsiness precautions given to patient. Stay well hydrated. Use  delsym, robitussin OR mucinex for daytime cough. - HYDROcodone-homatropine (HYDROMET) 5-1.5 MG/5ML syrup; Take 5 mLs by mouth every 6 (six) hours as needed for cough.  Dispense: 120 mL; Refill: 0       Margaretann LovelessJennifer M Burnette, [name]  [facility]

## 2016-02-13 ENCOUNTER — Other Ambulatory Visit: Payer: Self-pay | Admitting: Physician Assistant

## 2016-02-13 DIAGNOSIS — F988 Other specified behavioral and emotional disorders with onset usually occurring in childhood and adolescence: Secondary | ICD-10-CM

## 2016-02-13 MED ORDER — AMPHETAMINE-DEXTROAMPHET ER 20 MG PO CP24: 20.0000 mg | ORAL_CAPSULE | Freq: Every day | ORAL | 0 refills | Status: DC

## 2016-02-13 NOTE — Telephone Encounter (Signed)
Adderall XR refilled x 3. Next refill should not be needed until 05/10/16

## 2016-02-13 NOTE — Telephone Encounter (Signed)
Patient advised as directed below.  Thanks,  -Vinny Taranto 

## 2016-02-13 NOTE — Telephone Encounter (Signed)
Pt contacted office for refill request on the following medications: amphetamine-dextroamphetamine (ADDERALL XR) 20 MG 24 hr capsule Last Rx: 12/12/15 3 month supply Pt is requesting a 90 day supply. Please advise. Thanks TNP

## 2016-02-13 NOTE — Telephone Encounter (Signed)
Please review

## 2016-05-23 ENCOUNTER — Other Ambulatory Visit: Payer: Self-pay | Admitting: Physician Assistant

## 2016-05-23 DIAGNOSIS — F988 Other specified behavioral and emotional disorders with onset usually occurring in childhood and adolescence: Secondary | ICD-10-CM

## 2016-05-23 NOTE — Telephone Encounter (Signed)
Pt contacted office for refill request on the following medications:  amphetamine-dextroamphetamine (ADDERALL XR) 20 MG 24 hr capsule.  RU#[phone]/JYCB#[phone]/MW

## 2016-05-24 MED ORDER — AMPHETAMINE-DEXTROAMPHET ER 20 MG PO CP24: 20.0000 mg | ORAL_CAPSULE | Freq: Every day | ORAL | 0 refills | Status: DC

## 2016-05-24 NOTE — Telephone Encounter (Signed)
Called pt regarding RX's for Adderall placed uo front ready for pick up.[facility]  Thanks,  -Kalil Woessner

## 2016-05-24 NOTE — Telephone Encounter (Signed)
Adderall refilled x 3. Should not be due for next fill until June 2018

## 2016-08-24 ENCOUNTER — Other Ambulatory Visit: Payer: Self-pay | Admitting: Physician Assistant

## 2016-08-24 DIAGNOSIS — F988 Other specified behavioral and emotional disorders with onset usually occurring in childhood and adolescence: Secondary | ICD-10-CM

## 2016-08-24 NOTE — Telephone Encounter (Signed)
Pt need refill on adderall xr 20.  Pt is completely out  Thanks, Fortune Brandsteri

## 2016-08-25 MED ORDER — AMPHETAMINE-DEXTROAMPHET ER 20 MG PO CP24: 20.0000 mg | ORAL_CAPSULE | Freq: Every day | ORAL | 0 refills | Status: DC

## 2016-08-25 NOTE — Telephone Encounter (Signed)
Adderall XR 20mg  refilled x 3. Should not need refilled again until end of 10/2016.  NCSR reviewed.

## 2016-08-25 NOTE — Telephone Encounter (Signed)
Patient advised.

## 2016-09-12 ENCOUNTER — Encounter: Payer: Self-pay | Admitting: Physician Assistant

## 2016-09-12 ENCOUNTER — Ambulatory Visit (INDEPENDENT_AMBULATORY_CARE_PROVIDER_SITE_OTHER): Payer: BLUE CROSS/BLUE SHIELD | Admitting: Physician Assistant

## 2016-09-12 VITALS — BP 98/80 | HR 72 | Temp 97.8°F | Resp 16 | Ht 66.0 in | Wt 120.4 lb

## 2016-09-12 DIAGNOSIS — Z124 Encounter for screening for malignant neoplasm of cervix: Secondary | ICD-10-CM

## 2016-09-12 DIAGNOSIS — Z0000 Encounter for general adult medical examination without abnormal findings: Secondary | ICD-10-CM | POA: Diagnosis not present

## 2016-09-12 DIAGNOSIS — F988 Other specified behavioral and emotional disorders with onset usually occurring in childhood and adolescence: Secondary | ICD-10-CM | POA: Diagnosis not present

## 2016-09-12 DIAGNOSIS — Z800 Family history of malignant neoplasm of digestive organs: Secondary | ICD-10-CM | POA: Diagnosis not present

## 2016-09-12 DIAGNOSIS — Z720 Tobacco use: Secondary | ICD-10-CM

## 2016-09-12 DIAGNOSIS — Z833 Family history of diabetes mellitus: Secondary | ICD-10-CM

## 2016-09-12 DIAGNOSIS — R87619 Unspecified abnormal cytological findings in specimens from cervix uteri: Secondary | ICD-10-CM

## 2016-09-12 DIAGNOSIS — Z1239 Encounter for other screening for malignant neoplasm of breast: Secondary | ICD-10-CM

## 2016-09-12 DIAGNOSIS — Z1231 Encounter for screening mammogram for malignant neoplasm of breast: Secondary | ICD-10-CM

## 2016-09-12 DIAGNOSIS — N879 Dysplasia of cervix uteri, unspecified: Secondary | ICD-10-CM

## 2016-09-12 DIAGNOSIS — Z114 Encounter for screening for human immunodeficiency virus [HIV]: Secondary | ICD-10-CM | POA: Diagnosis not present

## 2016-09-12 NOTE — Progress Notes (Signed)
Patient: LETRICIA Rollins, Female    DOB: May 12, 1980, 36 y.o.   MRN: [id number] Visit Date: 09/12/2016  Today's Provider: [name], [name]   Chief Complaint  Patient presents with  . Annual Exam   Subjective:    Annual physical exam  Shirley Rollins is a 36 y.o. female who presents today for health maintenance and complete physical. She feels well. Shereports exercising daily. She reports she is sleeping fairly well.   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 She is not actively looking due to her mother's recent diagnosis of her mother's pancreatic cancer metastasizing to her liver. She did undergo a whipple procedure in 03/2016 and had post op complications with infection and developing ascites. She had almost 3 L removed and the fluid was tested and was found to be full of cancerous cells. The patient reports her father is having a really hard time with this and she is trying to be strong and help as much as she can. She does report feeling down and cries about it a lot, but she feels she is coping as best she can. She still tries to go out with friends and enjoys doing things she has previously.  -----------------------------------------------------------------   Review of Systems  Constitutional: Negative.   HENT: Negative.   Eyes: Negative.   Respiratory: Negative.   Cardiovascular: Negative.   Gastrointestinal: Negative.   Endocrine: Negative.   Genitourinary: Negative.   Musculoskeletal: Negative.   Skin: Negative.   Allergic/Immunologic: Negative.   Neurological: Negative.   Hematological: Negative.   Psychiatric/Behavioral: Negative.     Social History      She  reports that she has been smoking.  She has never used smokeless tobacco. She reports that she drinks alcohol. She reports that she does  not use drugs.       Social History   Social History  . Marital status: Divorced    Spouse name: N/A  . Number of children: 1  . Years of education: N/A   Social History Main Topics  . Smoking status: Current Every Day Smoker  . Smokeless tobacco: Never Used     Comment: 5 cigarretes a day.   . Alcohol use Yes     Comment: occassional  . Drug use: No  . Sexual activity: Not Asked   Other Topics Concern  . None   Social History Narrative  . None    Past Medical History:  Diagnosis Date  . Anemia   . Migraines      Patient Active Problem List   Diagnosis Date Noted  . 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 Current tobacco use 11/26/2014  . Abnormal cells of cervix 11/18/2013    Past Surgical History:  Procedure Laterality Date  . etopic pregnancy  1999  . TONSILLECTOMY  in 29's    Family History        Family Status  Relation Status  . Mother Alive  . Father Deceased       cause of death: AIDS  . Sister Alive       substance abuse; Bipolar Disorder  . MGM Deceased       Liver cancer  . MGF Alive  . PGM Deceased  . PGF Deceased        Her family history  includes Diabetes in her mother; Pancreatic cancer (age of onset: 22) in her mother.     Allergies  Allergen Reactions  . Penicillin G Anaphylaxis  . Sulfa Antibiotics Other (See Comments)     Current Outpatient Prescriptions:  .  amphetamine-dextroamphetamine (ADDERALL XR) 20 MG 24 hr capsule, Take 1 capsule (20 mg total) by mouth daily. Please do NOT fill until 10/23/16, Disp: 30 capsule, Rfl: 0 .  Multiple Vitamin (MULTIVITAMIN) tablet, Take 1 tablet by mouth daily., Disp: , Rfl:    Patient Care Team: Reine Just as PCP - General (Physician Assistant)      Objective:   Vitals: BP 98/80 (BP Location: Left Arm, Patient Position: Sitting, Cuff Size: Normal)   Pulse 72   Temp 97.8 F (36.6 C) (Oral)   Resp 16   Ht 5\' 6"  (1.676 m)   Wt 120 lb  6.4 oz (54.6 kg)   LMP 08/31/2016   SpO2 98%   BMI 19.43 kg/m    Vitals:   09/12/16 0920  BP: 98/80  Pulse: 72  Resp: 16  Temp: 97.8 F (36.6 C)  TempSrc: Oral  SpO2: 98%  Weight: 120 lb 6.4 oz (54.6 kg)  Height: 5\' 6"  (1.676 m)     Physical Exam  Constitutional: She is oriented to person, place, and time. She appears well-developed and well-nourished. No distress.  HENT:  Head: Normocephalic and atraumatic.  Right Ear: Hearing, tympanic membrane, external ear and ear canal normal.  Left Ear: Hearing, tympanic membrane, external ear and ear canal normal.  Nose: Nose normal.  Mouth/Throat: Uvula is midline, oropharynx is clear and moist and mucous membranes are normal. No oropharyngeal exudate.  Eyes: Pupils are equal, round, and reactive to light. Conjunctivae and EOM are normal. Right eye exhibits no discharge. Left eye exhibits no discharge. No scleral icterus.  Neck: Normal range of motion. Neck supple. No JVD present. No tracheal deviation present. No thyromegaly present.  Cardiovascular: Normal rate, regular rhythm, normal heart sounds and intact distal pulses.  Exam reveals no gallop and no friction rub.   No murmur heard. Pulmonary/Chest: Effort normal and breath sounds normal. No respiratory distress. She has no wheezes. She has no rales. She exhibits no tenderness.  Abdominal: Soft. Bowel sounds are normal. She exhibits no distension and no mass. There is no tenderness. There is no rebound and no guarding.  Genitourinary:  Genitourinary Comments: Deferred to GYN  Musculoskeletal: Normal range of motion. She exhibits no edema or tenderness.  Lymphadenopathy:    She has no cervical adenopathy.  Neurological: She is alert and oriented to person, place, and time.  Skin: Skin is warm and dry. No rash noted. She is not diaphoretic.  Psychiatric: She has a normal mood and affect. Her behavior is normal. Judgment and thought content normal.  Vitals  reviewed.    Depression Screen PHQ 2/9 Scores 09/12/2016  PHQ - 2 Score 2  PHQ- 9 Score 6      Assessment & Plan:     Routine Health Maintenance and Physical Exam  Exercise Activities and Dietary recommendations Goals    None      Immunization History  Administered Date(s) Administered  . Influenza,inj,Quad PF,36+ Mos 11/26/2014    Health Maintenance  Topic Date Due  . HIV Screening  08/01/1993  . INFLUENZA VACCINE  09/26/2016  . PAP SMEAR  10/17/2017  . TETANUS/TDAP  08/26/2021     Discussed health benefits of physical activity, and encouraged her to engage  in regular exercise appropriate for her age and condition.    1. Annual physical exam Normal physical exam today. Will check labs as below and f/u pending lab results. If labs are stable and WNL she will not need to have these rechecked for one year at her next annual physical exam. She is to call the office in the meantime if she has any acute issue, questions or concerns. - CBC with Differential - Comprehensive metabolic panel - Hemoglobin A1c - Lipid panel - TSH  2. Breast cancer screening Breast exams done through GYN.  3. Cervical cancer screening Extensive abnormal pap history since 2000. Noted in overview under diagnosis in problem list. Followed by GYN.  4. Abnormal cells of cervix See above medical treatment plan.  5. Current tobacco use Discussed smoking cessation in detail today talking for approx 5 minutes. Offered assistance and resources. Patient does not know if she will be able to quit at this time with dealing with the stress with her mother but is willing to try tips and techniques to reduce the amount she is smoking. - CBC with Differential - Comprehensive metabolic panel - Hemoglobin A1c - Lipid panel  6. 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 Refills not due until 10/2016. Continue Adderall XR 20mg  daily.  - CBC with Differential - Comprehensive metabolic  panel - Hemoglobin A1c - Lipid panel  7. Family history of diabetes mellitus Will check labs as below and f/u pending results. - Hemoglobin A1c - Lipid panel  8. Screening for HIV without presence of risk factors - HIV antibody  9. Family history of pancreatic cancer Mother diagnosed last year. Discussed that lab is not conclusive either way but patient desires to check as well. This has been ordered as below.  - Cancer Antigen 19-9  --------------------------------------------------------------------    Margaretann LovelessJennifer M Halia Franey, [name]  [facility]

## 2016-09-12 NOTE — Patient Instructions (Signed)

## 2016-09-13 LAB — CBC WITH DIFFERENTIAL/PLATELET
BASOS: 1 %
Basophils Absolute: 0 10*3/uL (ref 0.0–0.2)
EOS (ABSOLUTE): 0.1 10*3/uL (ref 0.0–0.4)
Eos: 3 %
Hematocrit: 41.8 % (ref 34.0–46.6)
Hemoglobin: 13.7 g/dL (ref 11.1–15.9)
IMMATURE GRANS (ABS): 0 10*3/uL (ref 0.0–0.1)
IMMATURE GRANULOCYTES: 0 %
LYMPHS: 35 %
Lymphocytes Absolute: 1.9 10*3/uL (ref 0.7–3.1)
MCH: 32.1 pg (ref 26.6–33.0)
MCHC: 32.8 g/dL (ref 31.5–35.7)
MCV: 98 fL — AB (ref 79–97)
MONOCYTES: 9 %
Monocytes Absolute: 0.5 10*3/uL (ref 0.1–0.9)
NEUTROS PCT: 52 %
Neutrophils Absolute: 2.9 10*3/uL (ref 1.4–7.0)
PLATELETS: 280 10*3/uL (ref 150–379)
RBC: 4.27 x10E6/uL (ref 3.77–5.28)
RDW: 12.9 % (ref 12.3–15.4)
WBC: 5.4 10*3/uL (ref 3.4–10.8)

## 2016-09-13 LAB — COMPREHENSIVE METABOLIC PANEL
A/G RATIO: 1.9 (ref 1.2–2.2)
ALT: 10 IU/L (ref 0–32)
AST: 19 IU/L (ref 0–40)
Albumin: 4.6 g/dL (ref 3.5–5.5)
Alkaline Phosphatase: 33 IU/L — ABNORMAL LOW (ref 39–117)
BUN/Creatinine Ratio: 13 (ref 9–23)
BUN: 7 mg/dL (ref 6–20)
Bilirubin Total: 0.7 mg/dL (ref 0.0–1.2)
CALCIUM: 9.5 mg/dL (ref 8.7–10.2)
CO2: 24 mmol/L (ref 20–29)
Chloride: 104 mmol/L (ref 96–106)
Creatinine, Ser: 0.56 mg/dL — ABNORMAL LOW (ref 0.57–1.00)
GFR, EST AFRICAN AMERICAN: 137 mL/min/{1.73_m2} (ref >59)
GFR, EST NON AFRICAN AMERICAN: 119 mL/min/{1.73_m2} (ref >59)
GLUCOSE: 87 mg/dL (ref 65–99)
Globulin, Total: 2.4 g/dL (ref 1.5–4.5)
Potassium: 4.9 mmol/L (ref 3.5–5.2)
Sodium: 140 mmol/L (ref 134–144)
TOTAL PROTEIN: 7 g/dL (ref 6.0–8.5)

## 2016-09-13 LAB — LIPID PANEL
CHOLESTEROL TOTAL: 162 mg/dL (ref 100–199)
Chol/HDL Ratio: 3.2 ratio (ref 0.0–4.4)
HDL: 51 mg/dL (ref >39)
LDL CALC: 87 mg/dL (ref 0–99)
TRIGLYCERIDES: 121 mg/dL (ref 0–149)
VLDL CHOLESTEROL CAL: 24 mg/dL (ref 5–40)

## 2016-09-13 LAB — HIV ANTIBODY (ROUTINE TESTING W REFLEX): HIV Screen 4th Generation wRfx: NONREACTIVE

## 2016-09-13 LAB — HEMOGLOBIN A1C
Est. average glucose Bld gHb Est-mCnc: 100 mg/dL
Hgb A1c MFr Bld: 5.1 % (ref 4.8–5.6)

## 2016-09-13 LAB — CANCER ANTIGEN 19-9: CA 19-9: 26 U/mL (ref 0–35)

## 2016-09-13 LAB — PLEASE NOTE

## 2016-09-13 LAB — TSH: TSH: 1.45 u[IU]/mL (ref 0.450–4.500)

## 2016-11-26 ENCOUNTER — Other Ambulatory Visit: Payer: Self-pay | Admitting: Physician Assistant

## 2016-11-26 DIAGNOSIS — F988 Other specified behavioral and emotional disorders with onset usually occurring in childhood and adolescence: Secondary | ICD-10-CM

## 2016-11-26 MED ORDER — AMPHETAMINE-DEXTROAMPHET ER 20 MG PO CP24: 20.0000 mg | ORAL_CAPSULE | Freq: Every day | ORAL | 0 refills | Status: DC

## 2016-11-26 NOTE — Telephone Encounter (Signed)
Pt contacted office for refill request on the following medications:  amphetamine-dextroamphetamine (ADDERALL XR) 20 MG 24 hr   CB#[phone]/MW

## 2016-11-26 NOTE — Telephone Encounter (Signed)
Please review. Thanks!  

## 2016-11-26 NOTE — Telephone Encounter (Signed)
Adderall Rx refilled x 3. Should not need refills until end of Dec 2018.

## 2016-11-26 NOTE — Telephone Encounter (Signed)
Patient advised prescription placed up front ready for pick up.  Thanks,  -Montia Haslip 

## 2017-03-05 ENCOUNTER — Telehealth: Payer: Self-pay | Admitting: Physician Assistant

## 2017-03-05 DIAGNOSIS — F988 Other specified behavioral and emotional disorders with onset usually occurring in childhood and adolescence: Secondary | ICD-10-CM

## 2017-03-05 MED ORDER — AMPHETAMINE-DEXTROAMPHET ER 20 MG PO CP24
20.0000 mg | ORAL_CAPSULE | Freq: Every day | ORAL | 0 refills | Status: DC
Start: 2017-03-05 — End: 2017-06-05

## 2017-03-05 MED ORDER — AMPHETAMINE-DEXTROAMPHET ER 20 MG PO CP24: 20.0000 mg | ORAL_CAPSULE | Freq: Every day | ORAL | 0 refills | Status: DC

## 2017-03-05 NOTE — Telephone Encounter (Signed)
Patient is requesting refill on her amphetamine-dextroamphetamine (ADDERALL XR) 20 MG 24 hr capsule    CB# [phone]

## 2017-03-05 NOTE — Telephone Encounter (Signed)
NCCSR reviewed. Rx printed x 3. Should not need refills until April 2019.

## 2017-06-05 ENCOUNTER — Other Ambulatory Visit: Payer: Self-pay | Admitting: Physician Assistant

## 2017-06-05 DIAGNOSIS — F988 Other specified behavioral and emotional disorders with onset usually occurring in childhood and adolescence: Secondary | ICD-10-CM

## 2017-06-05 MED ORDER — AMPHETAMINE-DEXTROAMPHET ER 20 MG PO CP24
20.0000 mg | ORAL_CAPSULE | Freq: Every day | ORAL | 0 refills | Status: DC
Start: 2017-06-05 — End: 2017-06-05

## 2017-06-05 MED ORDER — AMPHETAMINE-DEXTROAMPHET ER 20 MG PO CP24
20.0000 mg | ORAL_CAPSULE | Freq: Every day | ORAL | 0 refills | Status: DC
Start: 2017-06-05 — End: 2017-07-05

## 2017-06-05 NOTE — Progress Notes (Signed)
Refilled adderall. NCCSR reviewed.

## 2017-06-05 NOTE — Telephone Encounter (Signed)
Please review. Thanks!  

## 2017-06-05 NOTE — Telephone Encounter (Signed)
Patient needs refill on Adderall XR 20 mg. [facility] [address]

## 2017-07-05 ENCOUNTER — Telehealth: Payer: Self-pay | Admitting: Physician Assistant

## 2017-07-05 DIAGNOSIS — F988 Other specified behavioral and emotional disorders with onset usually occurring in childhood and adolescence: Secondary | ICD-10-CM

## 2017-07-05 MED ORDER — AMPHETAMINE-DEXTROAMPHET ER 20 MG PO CP24: 20.0000 mg | ORAL_CAPSULE | Freq: Every day | ORAL | 0 refills | Status: DC

## 2017-07-05 NOTE — Telephone Encounter (Signed)
Pt contacted office for refill request on the following medications:  amphetamine-dextroamphetamine (ADDERALL XR) 20 MG 24 hr capsule  [facility] [address]  Please advise. Thanks TNP

## 2017-07-05 NOTE — Telephone Encounter (Signed)
Refilled for her 

## 2017-07-05 NOTE — Telephone Encounter (Signed)
Please review. KW 

## 2017-08-06 ENCOUNTER — Other Ambulatory Visit: Payer: Self-pay | Admitting: Physician Assistant

## 2017-08-06 DIAGNOSIS — F988 Other specified behavioral and emotional disorders with onset usually occurring in childhood and adolescence: Secondary | ICD-10-CM

## 2017-08-06 MED ORDER — AMPHETAMINE-DEXTROAMPHET ER 20 MG PO CP24: 20.0000 mg | ORAL_CAPSULE | Freq: Every day | ORAL | 0 refills | Status: DC

## 2017-08-06 NOTE — Telephone Encounter (Signed)
Pt contacted office for refill request on the following medications:  amphetamine-dextroamphetamine (ADDERALL XR) 20 MG 24 hr capsule  [facility] [address]  Last Rx: 07/05/17 LOV: 09/12/16 NOV: 09/13/17 Please advise. Thanks TNP

## 2017-09-05 ENCOUNTER — Other Ambulatory Visit: Payer: Self-pay | Admitting: Physician Assistant

## 2017-09-05 DIAGNOSIS — F988 Other specified behavioral and emotional disorders with onset usually occurring in childhood and adolescence: Secondary | ICD-10-CM

## 2017-09-05 NOTE — Telephone Encounter (Signed)
Pt needs refill on her adderall xr 20 mg  [facility] [address]  Thanks teri

## 2017-09-06 MED ORDER — AMPHETAMINE-DEXTROAMPHET ER 20 MG PO CP24: 20.0000 mg | ORAL_CAPSULE | Freq: Every day | ORAL | 0 refills | Status: DC

## 2017-09-13 ENCOUNTER — Encounter: Payer: Self-pay | Admitting: Physician Assistant

## 2017-09-13 ENCOUNTER — Ambulatory Visit (INDEPENDENT_AMBULATORY_CARE_PROVIDER_SITE_OTHER): Payer: BLUE CROSS/BLUE SHIELD | Admitting: Physician Assistant

## 2017-09-13 VITALS — BP 102/76 | HR 89 | Temp 98.1°F | Ht 66.0 in | Wt 112.8 lb

## 2017-09-13 DIAGNOSIS — Z720 Tobacco use: Secondary | ICD-10-CM | POA: Diagnosis not present

## 2017-09-13 DIAGNOSIS — Z0000 Encounter for general adult medical examination without abnormal findings: Secondary | ICD-10-CM | POA: Diagnosis not present

## 2017-09-13 DIAGNOSIS — Z716 Tobacco abuse counseling: Secondary | ICD-10-CM

## 2017-09-13 DIAGNOSIS — Z7689 Persons encountering health services in other specified circumstances: Secondary | ICD-10-CM

## 2017-09-13 DIAGNOSIS — Z800 Family history of malignant neoplasm of digestive organs: Secondary | ICD-10-CM

## 2017-09-13 MED ORDER — BUPROPION HCL ER (XL) 150 MG PO TB24: 150.0000 mg | ORAL_TABLET | Freq: Every day | ORAL | 1 refills | Status: DC

## 2017-09-13 NOTE — Patient Instructions (Signed)

## 2017-09-13 NOTE — Progress Notes (Signed)
Patient: Shirley Rollins, Female    DOB: 04-24-1980, 37 y.o.   MRN: [id number] Visit Date: 09/13/2017  Today's Provider: [name], [name]   Chief Complaint  Patient presents with  . Annual Exam   Subjective:    Annual physical exam Shirley Rollins is a 37 y.o. female who presents today for health maintenance and complete physical. She feels well. She reports exercising 6 days per week. She reports she is sleeping well. ----------------------------------------------------------------- Last CPE: 09/12/16 Pap: 10/18/2014- Followed by GYN   Review of Systems  Constitutional: Negative.   HENT: Negative.   Eyes: Negative.   Respiratory: Negative.   Cardiovascular: Negative.   Gastrointestinal: Negative.   Endocrine: Negative.   Genitourinary: Negative.   Musculoskeletal: Negative.   Skin: Negative.   Allergic/Immunologic: Negative.   Neurological: Negative.   Hematological: Negative.   Psychiatric/Behavioral: Negative.     Social History      She  reports that she has been smoking.  She has never used smokeless tobacco. She reports that she drinks alcohol. She reports that she does not use drugs.       Social History   Socioeconomic History  . Marital status: Divorced    Spouse name: Not on file  . Number of children: 1  . Years of education: Not on file  . Highest education level: Not on file  Occupational History  . Not on file  Social Needs  . Financial resource strain: Not on file  . Food insecurity:    Worry: Not on file    Inability: Not on file  . Transportation needs:    Medical: Not on file    Non-medical: Not on file  Tobacco Use  . Smoking status: Current Every Day Smoker  . Smokeless tobacco: Never Used  . Tobacco comment: 3/4 of a pack per day  Substance and Sexual Activity  . Alcohol use: Yes    Comment: occassional  . Drug use: No  . Sexual activity: Not on file  Lifestyle  . Physical activity:    Days per week: Not on file      Minutes per session: Not on file  . Stress: Not on file  Relationships  . Social connections:    Talks on phone: Not on file    Gets together: Not on file    Attends religious service: Not on file    Active member of club or organization: Not on file    Attends meetings of clubs or organizations: Not on file    Relationship status: Not on file  Other Topics Concern  . Not on file  Social History Narrative  . Not on file    Past Medical History:  Diagnosis Date  . Anemia   . Migraines      Patient Active Problem List   Diagnosis Date Noted  . 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 Current tobacco use 11/26/2014  . Abnormal cells of cervix 11/18/2013    Past Surgical History:  Procedure Laterality Date  . etopic pregnancy  1999  . TONSILLECTOMY  in 60's    Family History        Family Status  Relation Name Status  . Mother  Deceased at age 12  . Father  Deceased       cause of death: AIDS  . Sister 1/2 sister Alive       substance abuse; Bipolar Disorder  . MGM  Deceased  Liver cancer  . MGF  Alive  . PGM  Deceased  . PGF  Deceased        Her family history includes Diabetes in her mother; Pancreatic cancer (age of onset: 6464) in her mother.      Allergies  Allergen Reactions  . Penicillin G Anaphylaxis  . Sulfa Antibiotics Other (See Comments)     Current Outpatient Medications:  .  amphetamine-dextroamphetamine (ADDERALL XR) 20 MG 24 hr capsule, Take 1 capsule (20 mg total) by mouth daily., Disp: 30 capsule, Rfl: 0 .  Multiple Vitamin (MULTIVITAMIN) tablet, Take 1 tablet by mouth daily., Disp: , Rfl:    Patient Care Team: Reine JustBurnette, Jennifer M, [name] as PCP - General (Physician Assistant)      Objective:   Vitals: BP 102/76 (BP Location: Left Arm, Patient Position: Sitting, Cuff Size: Normal)   Pulse 89   Temp 98.1 F (36.7 C) (Oral)   Ht 5\' 6"  (1.676 m)   Wt 112 lb 12.8 oz (51.2 kg)   SpO2 99%   BMI  18.21 kg/m     Physical Exam   Depression Screen PHQ 2/9 Scores 09/13/2017 09/12/2016  PHQ - 2 Score 1 2  PHQ- 9 Score 2 6      Assessment & Plan:     Routine Health Maintenance and Physical Exam  Exercise Activities and Dietary recommendations Goals    None      Immunization History  Administered Date(s) Administered  . Influenza,inj,Quad PF,6+ Mos 11/26/2014    Health Maintenance  Topic Date Due  . INFLUENZA VACCINE  09/26/2017  . PAP SMEAR  10/17/2017  . TETANUS/TDAP  08/26/2021  . HIV Screening  Completed     Discussed health benefits of physical activity, and encouraged her to engage in regular exercise appropriate for her age and condition.    1. Annual physical exam Normal physical exam today. Will check labs as below and f/u pending lab results. If labs are stable and WNL she will not need to have these rechecked for one year at her next annual physical exam. She is to call the office in the meantime if she has any acute issue, questions or concerns. - CBC w/Diff/Platelet - Comprehensive Metabolic Panel (CMET) - TSH - HgB A1c - Lipid Profile - Cancer antigen 19-9  2. Current tobacco use Patient wishes to try to quit smoking again. Has tried in the past but when her mother passed in November 2018 from pancreatic cancer she started back up. She has used wellbutrin successfully and wishes to try again. Wellbutrin prescribed as below.  - buPROPion (WELLBUTRIN XL) 150 MG 24 hr tablet; Take 1 tablet (150 mg total) by mouth daily.  Dispense: 90 tablet; Refill: 1  3. Encounter for smoking cessation counseling See above medical treatment plan. - buPROPion (WELLBUTRIN XL) 150 MG 24 hr tablet; Take 1 tablet (150 mg total) by mouth daily.  Dispense: 90 tablet; Refill: 1  4. Family history of pancreatic cancer Mother passed in November 2018 from pancreatic cancer.  - Cancer antigen 19-9  5. Encounter for skin care Referral to dermatology made for routine skin  screenings.   --------------------------------------------------------------------    Margaretann LovelessJennifer M Burnette, [name]  [facility]

## 2017-09-20 ENCOUNTER — Ambulatory Visit: Payer: BLUE CROSS/BLUE SHIELD | Admitting: Physician Assistant

## 2017-09-20 ENCOUNTER — Encounter: Payer: Self-pay | Admitting: Physician Assistant

## 2017-09-20 VITALS — BP 110/70 | HR 100 | Temp 98.7°F | Resp 16 | Wt 114.2 lb

## 2017-09-20 DIAGNOSIS — L539 Erythematous condition, unspecified: Secondary | ICD-10-CM | POA: Diagnosis not present

## 2017-09-20 DIAGNOSIS — W57XXXA Bitten or stung by nonvenomous insect and other nonvenomous arthropods, initial encounter: Secondary | ICD-10-CM

## 2017-09-20 MED ORDER — DOXYCYCLINE HYCLATE 100 MG PO TABS: 100.0000 mg | ORAL_TABLET | Freq: Two times a day (BID) | ORAL | 0 refills | Status: DC

## 2017-09-20 NOTE — Progress Notes (Signed)
       Patient: Shirley LarssonJessica S Nesser Female    DOB: 04/22/1980   37 y.o.   MRN: [id number] Visit Date: 09/20/2017  Today's Provider: Margaretann LovelessJennifer M Burnette, [name]   Chief Complaint  Patient presents with  . Insect Bite   Subjective:    HPI  Patient here today with c/o bug bite in her belly button and under her left breast. She reports that her stomach is itching and hurting. She notice the bite two days ago. Treatment tried:Neosporin and Benadryl anti itch gel. She reports that this morning the belly button was draining when she got up.  Was at the beach this weekend and possibly got bit then but unsure.    Allergies  Allergen Reactions  . Penicillin G Anaphylaxis  . Sulfa Antibiotics Other (See Comments)     Current Outpatient Medications:  .  amphetamine-dextroamphetamine (ADDERALL XR) 20 MG 24 hr capsule, Take 1 capsule (20 mg total) by mouth daily., Disp: 30 capsule, Rfl: 0 .  Multiple Vitamin (MULTIVITAMIN) tablet, Take 1 tablet by mouth daily., Disp: , Rfl:  .  buPROPion (WELLBUTRIN XL) 150 MG 24 hr tablet, Take 1 tablet (150 mg total) by mouth daily. (Patient not taking: Reported on 09/20/2017), Disp: 90 tablet, Rfl: 1  Review of Systems  Constitutional: Negative.   Respiratory: Negative.   Cardiovascular: Negative.   Skin: Positive for wound.  Neurological: Negative.     Social History   Tobacco Use  . Smoking status: Current Every Day Smoker  . Smokeless tobacco: Never Used  . Tobacco comment: 3/4 of a pack per day  Substance Use Topics  . Alcohol use: Yes    Comment: occassional   Objective:   BP 110/70 (BP Location: Left Arm, Patient Position: Sitting, Cuff Size: Normal)   Pulse 100   Temp 98.7 F (37.1 C) (Oral)   Resp 16   Wt 114 lb 3.2 oz (51.8 kg)   LMP 09/17/2017   BMI 18.43 kg/m  Vitals:   09/20/17 1124  BP: 110/70  Pulse: 100  Resp: 16  Temp: 98.7 F (37.1 C)  TempSrc: Oral  Weight: 114 lb 3.2 oz (51.8 kg)     Physical Exam    Constitutional: She appears well-developed and well-nourished. No distress.  Neck: Normal range of motion. Neck supple.  Cardiovascular: Normal rate, regular rhythm and normal heart sounds. Exam reveals no gallop and no friction rub.  No murmur heard. Pulmonary/Chest: Effort normal and breath sounds normal. No respiratory distress. She has no wheezes. She has no rales.  Abdominal:    Skin: She is not diaphoretic.  Vitals reviewed.      Assessment & Plan:     1. Bug bite, initial encounter Will treat with doxycycline as below since early signs of infection noted and patient had been in the ocean this past weekend. She is to call if symptoms worsen.  - doxycycline (VIBRA-TABS) 100 MG tablet; Take 1 tablet (100 mg total) by mouth 2 (two) times daily.  Dispense: 20 tablet; Refill: 0       Margaretann LovelessJennifer M Burnette, [name]  [facility]

## 2017-09-24 LAB — LIPID PANEL
Chol/HDL Ratio: 3.9 ratio (ref 0.0–4.4)
Cholesterol, Total: 183 mg/dL (ref 100–199)
HDL: 47 mg/dL (ref >39)
LDL Calculated: 106 mg/dL — ABNORMAL HIGH (ref 0–99)
Triglycerides: 149 mg/dL (ref 0–149)
VLDL Cholesterol Cal: 30 mg/dL (ref 5–40)

## 2017-09-24 LAB — COMPREHENSIVE METABOLIC PANEL
A/G RATIO: 2.1 (ref 1.2–2.2)
ALT: 11 IU/L (ref 0–32)
AST: 16 IU/L (ref 0–40)
Albumin: 4.6 g/dL (ref 3.5–5.5)
Alkaline Phosphatase: 36 IU/L — ABNORMAL LOW (ref 39–117)
BUN/Creatinine Ratio: 14 (ref 9–23)
BUN: 10 mg/dL (ref 6–20)
Bilirubin Total: 0.6 mg/dL (ref 0.0–1.2)
CO2: 23 mmol/L (ref 20–29)
Calcium: 9.3 mg/dL (ref 8.7–10.2)
Chloride: 103 mmol/L (ref 96–106)
Creatinine, Ser: 0.72 mg/dL (ref 0.57–1.00)
GFR calc Af Amer: 122 mL/min/{1.73_m2} (ref >59)
GFR calc non Af Amer: 106 mL/min/{1.73_m2} (ref >59)
GLOBULIN, TOTAL: 2.2 g/dL (ref 1.5–4.5)
Glucose: 92 mg/dL (ref 65–99)
Potassium: 4.5 mmol/L (ref 3.5–5.2)
SODIUM: 139 mmol/L (ref 134–144)
TOTAL PROTEIN: 6.8 g/dL (ref 6.0–8.5)

## 2017-09-24 LAB — CBC WITH DIFFERENTIAL/PLATELET
BASOS: 1 %
Basophils Absolute: 0 10*3/uL (ref 0.0–0.2)
EOS (ABSOLUTE): 0.2 10*3/uL (ref 0.0–0.4)
EOS: 4 %
Hematocrit: 40.6 % (ref 34.0–46.6)
Hemoglobin: 13.6 g/dL (ref 11.1–15.9)
IMMATURE GRANS (ABS): 0 10*3/uL (ref 0.0–0.1)
IMMATURE GRANULOCYTES: 0 %
LYMPHS: 44 %
Lymphocytes Absolute: 2.4 10*3/uL (ref 0.7–3.1)
MCH: 32.5 pg (ref 26.6–33.0)
MCHC: 33.5 g/dL (ref 31.5–35.7)
MCV: 97 fL (ref 79–97)
Monocytes Absolute: 0.5 10*3/uL (ref 0.1–0.9)
Monocytes: 9 %
NEUTROS PCT: 42 %
Neutrophils Absolute: 2.2 10*3/uL (ref 1.4–7.0)
Platelets: 311 10*3/uL (ref 150–450)
RBC: 4.18 x10E6/uL (ref 3.77–5.28)
RDW: 13.4 % (ref 12.3–15.4)
WBC: 5.3 10*3/uL (ref 3.4–10.8)

## 2017-09-24 LAB — HEMOGLOBIN A1C
Est. average glucose Bld gHb Est-mCnc: 94 mg/dL
Hgb A1c MFr Bld: 4.9 % (ref 4.8–5.6)

## 2017-09-24 LAB — TSH: TSH: 3.78 u[IU]/mL (ref 0.450–4.500)

## 2017-09-24 LAB — CANCER ANTIGEN 19-9: CA 19-9: 23 U/mL (ref 0–35)

## 2017-09-25 ENCOUNTER — Ambulatory Visit (INDEPENDENT_AMBULATORY_CARE_PROVIDER_SITE_OTHER): Payer: BLUE CROSS/BLUE SHIELD | Admitting: Physician Assistant

## 2017-09-25 ENCOUNTER — Other Ambulatory Visit (HOSPITAL_COMMUNITY)
Admission: RE | Admit: 2017-09-25 | Discharge: 2017-09-25 | Disposition: A | Discharge status: Home / Self Care | Payer: BLUE CROSS/BLUE SHIELD | Source: Ambulatory Visit | Attending: Physician Assistant | Admitting: Physician Assistant

## 2017-09-25 ENCOUNTER — Telehealth: Payer: Self-pay

## 2017-09-25 ENCOUNTER — Encounter: Payer: Self-pay | Admitting: Physician Assistant

## 2017-09-25 VITALS — BP 100/70 | HR 88 | Temp 98.0°F | Resp 16 | Wt 112.4 lb

## 2017-09-25 DIAGNOSIS — Z124 Encounter for screening for malignant neoplasm of cervix: Secondary | ICD-10-CM | POA: Insufficient documentation

## 2017-09-25 NOTE — Telephone Encounter (Signed)
Patient was given a copy of her labs in office today.  Thanks,  -Koralyn Prestage

## 2017-09-25 NOTE — Progress Notes (Signed)
       Patient: Shirley LarssonJessica S Essick Female    DOB: 04/28/1980   37 y.o.   MRN: [id number] Visit Date: 09/25/2017  Today's Provider: Margaretann LovelessJennifer M Seanna Sisler, [name]   Chief Complaint  Patient presents with  . Follow-up    Pap only   Subjective:    HPI Patient here today for a pap only. She had her CPE on 09/20/17 but had been on her menstrual so she returned today for the pap. No complaints.      Allergies  Allergen Reactions  . Penicillin G Anaphylaxis  . Sulfa Antibiotics Other (See Comments)     Current Outpatient Medications:  .  amphetamine-dextroamphetamine (ADDERALL XR) 20 MG 24 hr capsule, Take 1 capsule (20 mg total) by mouth daily., Disp: 30 capsule, Rfl: 0 .  doxycycline (VIBRA-TABS) 100 MG tablet, Take 1 tablet (100 mg total) by mouth 2 (two) times daily., Disp: 20 tablet, Rfl: 0 .  Multiple Vitamin (MULTIVITAMIN) tablet, Take 1 tablet by mouth daily., Disp: , Rfl:  .  buPROPion (WELLBUTRIN XL) 150 MG 24 hr tablet, Take 1 tablet (150 mg total) by mouth daily. (Patient not taking: Reported on 09/20/2017), Disp: 90 tablet, Rfl: 1  Review of Systems  Constitutional: Negative.   Respiratory: Negative.   Cardiovascular: Negative.   Gastrointestinal: Negative.   Genitourinary: Negative.     Social History   Tobacco Use  . Smoking status: Current Every Day Smoker  . Smokeless tobacco: Never Used  . Tobacco comment: 3/4 of a pack per day  Substance Use Topics  . Alcohol use: Yes    Comment: occassional   Objective:   BP 100/70 (BP Location: Left Arm, Patient Position: Sitting, Cuff Size: Normal)   Pulse 88   Temp 98 F (36.7 C) (Oral)   Resp 16   Wt 112 lb 6.4 oz (51 kg)   LMP 09/17/2017   SpO2 97%   BMI 18.14 kg/m  Vitals:   09/25/17 0834  BP: 100/70  Pulse: 88  Resp: 16  Temp: 98 F (36.7 C)  TempSrc: Oral  SpO2: 97%  Weight: 112 lb 6.4 oz (51 kg)     Physical Exam  Constitutional: She appears well-developed and well-nourished.  HENT:  Head:  Normocephalic and atraumatic.  Eyes: EOM are normal.  Neck: Normal range of motion. Neck supple.  Pulmonary/Chest: Effort normal. No respiratory distress.  Abdominal: Hernia confirmed negative in the right inguinal area and confirmed negative in the left inguinal area.  Genitourinary: Rectum normal, vagina normal and uterus normal. Pelvic exam was performed with patient supine. There is no rash, tenderness, lesion or injury on the right labia. There is no rash, tenderness, lesion or injury on the left labia. Cervix exhibits no motion tenderness, no discharge and no friability. Right adnexum displays no mass, no tenderness and no fullness. Left adnexum displays no mass, no tenderness and no fullness. No erythema or tenderness in the vagina. No vaginal discharge found.  Lymphadenopathy: No inguinal adenopathy noted on the right or left side.  Vitals reviewed.      Assessment & Plan:     1. Encounter for Papanicolaou smear for cervical cancer screening Pap collected today. Will send as below and f/u pending results. - Cytology - PAP       Margaretann LovelessJennifer M Toan Mort, [name]  [facility]

## 2017-09-25 NOTE — Patient Instructions (Signed)

## 2017-09-25 NOTE — Telephone Encounter (Signed)
-----   Message from Margaretann LovelessJennifer M Burnette, [place]PA-C sent at 09/24/2017  1:31 PM EDT ----- All labs are within normal limits and stable.  Thanks! -JB

## 2017-09-26 ENCOUNTER — Telehealth: Payer: Self-pay

## 2017-09-26 LAB — CYTOLOGY - PAP
Diagnosis: NEGATIVE
HPV: NOT DETECTED

## 2017-09-26 NOTE — Telephone Encounter (Signed)
LMTCB. Results and result note released to My Chart.  

## 2017-09-26 NOTE — Telephone Encounter (Signed)
-----   Message from Margaretann LovelessJennifer M Burnette, [place]PA-C sent at 09/26/2017  4:15 PM EDT ----- Pap is normal, HPV negative.  Will repeat in 3-5 years.

## 2017-09-26 NOTE — Telephone Encounter (Signed)
Patient advised.

## 2017-09-26 NOTE — Telephone Encounter (Signed)
Pt returned call. Thanks TNP °

## 2017-10-04 ENCOUNTER — Other Ambulatory Visit: Payer: Self-pay | Admitting: Physician Assistant

## 2017-10-04 DIAGNOSIS — F988 Other specified behavioral and emotional disorders with onset usually occurring in childhood and adolescence: Secondary | ICD-10-CM

## 2017-10-04 MED ORDER — AMPHETAMINE-DEXTROAMPHET ER 20 MG PO CP24: 20.0000 mg | ORAL_CAPSULE | Freq: Every day | ORAL | 0 refills | Status: DC

## 2017-10-04 NOTE — Telephone Encounter (Signed)
Patient needs refills on  Adderall XR 20 mg. [facility] [address]

## 2017-11-07 ENCOUNTER — Other Ambulatory Visit: Payer: Self-pay | Admitting: Physician Assistant

## 2017-11-07 DIAGNOSIS — F988 Other specified behavioral and emotional disorders with onset usually occurring in childhood and adolescence: Secondary | ICD-10-CM

## 2017-11-07 MED ORDER — AMPHETAMINE-DEXTROAMPHET ER 20 MG PO CP24: 20.0000 mg | ORAL_CAPSULE | Freq: Every day | ORAL | 0 refills | Status: DC

## 2017-11-07 NOTE — Telephone Encounter (Signed)
Pt needs refill adderall XR 20mg   [facility] [address]  Thanks teri

## 2017-12-06 ENCOUNTER — Other Ambulatory Visit: Payer: Self-pay | Admitting: Physician Assistant

## 2017-12-06 DIAGNOSIS — F988 Other specified behavioral and emotional disorders with onset usually occurring in childhood and adolescence: Secondary | ICD-10-CM

## 2017-12-06 MED ORDER — AMPHETAMINE-DEXTROAMPHET ER 20 MG PO CP24: 20.0000 mg | ORAL_CAPSULE | Freq: Every day | ORAL | 0 refills | Status: DC

## 2017-12-06 NOTE — Telephone Encounter (Signed)
Pt needing a refill on:  amphetamine-dextroamphetamine (ADDERALL XR) 20 MG 24 hr capsule - pt will be out on Sunday.  Please fill at:  [facility] [address],  [facility] [address] [phone] (Phone) [phone] (Fax)   Thanks, [facility]

## 2018-01-08 ENCOUNTER — Other Ambulatory Visit: Payer: Self-pay | Admitting: Physician Assistant

## 2018-01-08 DIAGNOSIS — F988 Other specified behavioral and emotional disorders with onset usually occurring in childhood and adolescence: Secondary | ICD-10-CM

## 2018-01-08 MED ORDER — AMPHETAMINE-DEXTROAMPHET ER 20 MG PO CP24: 20.0000 mg | ORAL_CAPSULE | Freq: Every day | ORAL | 0 refills | Status: DC

## 2018-01-08 NOTE — Telephone Encounter (Signed)
Patient was advised of medication refill.

## 2018-01-08 NOTE — Telephone Encounter (Signed)
Pt contacted office for refill request on the following medications:  amphetamine-dextroamphetamine (ADDERALL XR) 20 MG 24 hr capsule  [facility] [address]  Last Rx: 12/06/17 LOV: 09/25/17 Please advise. Thanks TNP

## 2018-02-10 ENCOUNTER — Other Ambulatory Visit: Payer: Self-pay | Admitting: Physician Assistant

## 2018-02-10 DIAGNOSIS — F988 Other specified behavioral and emotional disorders with onset usually occurring in childhood and adolescence: Secondary | ICD-10-CM

## 2018-02-10 MED ORDER — AMPHETAMINE-DEXTROAMPHET ER 20 MG PO CP24: 20.0000 mg | ORAL_CAPSULE | Freq: Every day | ORAL | 0 refills | Status: DC

## 2018-02-10 NOTE — Telephone Encounter (Signed)
Pt needing a refill on: ° °amphetamine-dextroamphetamine (ADDERALL XR) 20 MG 24 hr capsule ° °Please fill at: ° °[id number] [place] [place] - [address] [phone] (Phone) °[phone] (Fax)  ° °Thanks, °[facility] ° °

## 2018-03-20 ENCOUNTER — Other Ambulatory Visit: Payer: Self-pay | Admitting: Physician Assistant

## 2018-03-20 DIAGNOSIS — F988 Other specified behavioral and emotional disorders with onset usually occurring in childhood and adolescence: Secondary | ICD-10-CM

## 2018-03-20 NOTE — Telephone Encounter (Signed)
amphetamine-dextroamphetamine (ADDERALL XR) 20 MG 24 hr capsule Pt needing a refill.  Please fill at: [facility] [address], [place] - [address] [phone] (Phone) [phone] (Fax)    Thanks, [facility]

## 2018-03-20 NOTE — Telephone Encounter (Signed)
L.O.V. was 09/25/2017, please advise.

## 2018-03-21 MED ORDER — AMPHETAMINE-DEXTROAMPHET ER 20 MG PO CP24: 20.0000 mg | ORAL_CAPSULE | Freq: Every day | ORAL | 0 refills | Status: DC

## 2018-03-21 NOTE — Telephone Encounter (Signed)
Refilled but please contact patient to be seen before next fill for 6 month f/u

## 2018-03-21 NOTE — Telephone Encounter (Signed)
Patient was advised and schedule appointment for 03/26/2018.

## 2018-03-24 NOTE — Progress Notes (Signed)
Patient: Shirley LarssonJessica S Bogan Female    DOB: 03/19/1980   38 y.o.   MRN: [id number] Visit Date: 03/26/2018  Today's Provider: Margaretann LovelessJennifer M Burnette, [name]   Chief Complaint  Patient presents with  . Follow-up   Subjective:     HPI  Shirley FeltyJessica Lumbert is a 38 yr old female that is here today to follow up her ADHD. She feels well on her Adderall XR 20mg  daily. She reports overall good control of symptoms. She does have days where she has some breakthrough symptoms but overall feels well controlled.    Allergies  Allergen Reactions  . Penicillin G Anaphylaxis  . Sulfa Antibiotics Other (See Comments)     Current Outpatient Medications:  .  amphetamine-dextroamphetamine (ADDERALL XR) 20 MG 24 hr capsule, Take 1 capsule (20 mg total) by mouth daily., Disp: 30 capsule, Rfl: 0 .  Multiple Vitamin (MULTIVITAMIN) tablet, Take 1 tablet by mouth daily., Disp: , Rfl:  .  buPROPion (WELLBUTRIN XL) 150 MG 24 hr tablet, Take 1 tablet (150 mg total) by mouth daily. (Patient not taking: Reported on 09/20/2017), Disp: 90 tablet, Rfl: 1 .  doxycycline (VIBRA-TABS) 100 MG tablet, Take 1 tablet (100 mg total) by mouth 2 (two) times daily. (Patient not taking: Reported on 03/26/2018), Disp: 20 tablet, Rfl: 0  Review of Systems  Constitutional: Negative for appetite change, chills, fatigue and fever.  Respiratory: Negative for chest tightness and shortness of breath.   Cardiovascular: Negative for chest pain and palpitations.  Gastrointestinal: Negative for abdominal pain, nausea and vomiting.  Neurological: Negative for dizziness and weakness.  Psychiatric/Behavioral: Positive for decreased concentration.    Social History   Tobacco Use  . Smoking status: Current Every Day Smoker  . Smokeless tobacco: Never Used  . Tobacco comment: 3/4 of a pack per day  Substance Use Topics  . Alcohol use: Yes    Comment: occassional      Objective:   BP 122/83 (BP Location: Left Arm, Patient Position:  Sitting, Cuff Size: Normal)   Pulse 73   Temp 98.1 F (36.7 C) (Oral)   Resp 16   Wt 115 lb (52.2 kg)   SpO2 95%   BMI 18.56 kg/m  Vitals:   03/26/18 1057  BP: 122/83  Pulse: 73  Resp: 16  Temp: 98.1 F (36.7 C)  TempSrc: Oral  SpO2: 95%  Weight: 115 lb (52.2 kg)     Physical Exam Vitals signs reviewed.  Constitutional:      Appearance: She is well-developed.  HENT:     Head: Normocephalic and atraumatic.  Neck:     Musculoskeletal: Normal range of motion and neck supple.  Pulmonary:     Effort: Pulmonary effort is normal. No respiratory distress.  Psychiatric:        Mood and Affect: Mood normal.        Behavior: Behavior normal.        Thought Content: Thought content normal.        Judgment: Judgment normal.   Adult ADHD Self-Report Scale (ASRS-v1.1) Symptom Checklist  Part A 1. How often do you have trouble wrapping up the final details of a project, once the challenging parts have been done?: Rarely 2. How often do you have difficulty getting things done in order when you have to do a task that requires organization?: (!) Sometimes 3. How often do you have problems remembering appointments or obligations?: (!) Sometimes 4. When you have a task that requires  a lot of thought, how often do you avoid or delay getting started?: (!) Often 5. How often do you fidget or squirm with your hands or feet when you have to sit down for a long time?: (!) Very Often 6. How often do you feel overly active and compelled to do things, like you were driven by a motor?: Sometimes  Part B 7. How often do you make careless mistakes when you have to work on a boring or difficult project?: Sometimes 8. How often do you have difficulty keeping your attention when you are doing boring or repetitive work?: Sometimes 9. How often do you have difficulty concentrating on what people say to you, even when they are speaking to you directly?: (!) Sometimes 10. How often do you misplace or  have difficulty finding things at home or at work?: (!) Often 11. How often are you distracted by activity or noise around you?: (!) Very Often 12. How often do you leave your seat in meetings or other situations in which you are expected to remain seated?: Never 13. How often do you feel restless or fidgety?: Sometimes 14. How often do you have difficulty unwinding and relaxing when you have time to yourself?: Rarely 15. How often do you find yourself talking too much when you are in social situations?: Never 16. When you are in a conversation, how often do you find yourself finishing the sentences of the people you are talking to, before they can finish them themselves?: Never 17. How often do you have difficulty waiting your turn in situations when turn taking is required?: Never 18. 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 Will continue treatment as below. Patient continues to look for full time employment as a Artistregistered dietician. 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 I will see her back in 6 months for CPE.      Margaretann LovelessJennifer M Burnette, [name]  [facility]

## 2018-03-26 ENCOUNTER — Ambulatory Visit: Payer: BLUE CROSS/BLUE SHIELD | Admitting: Physician Assistant

## 2018-03-26 ENCOUNTER — Encounter: Payer: Self-pay | Admitting: Physician Assistant

## 2018-03-26 VITALS — BP 122/83 | HR 73 | Temp 98.1°F | Resp 16 | Wt 115.0 lb

## 2018-03-26 DIAGNOSIS — F988 Other specified behavioral and emotional disorders with onset usually occurring in childhood and adolescence: Secondary | ICD-10-CM

## 2018-04-29 ENCOUNTER — Other Ambulatory Visit: Payer: Self-pay | Admitting: Physician Assistant

## 2018-04-29 DIAGNOSIS — F988 Other specified behavioral and emotional disorders with onset usually occurring in childhood and adolescence: Secondary | ICD-10-CM

## 2018-04-29 NOTE — Telephone Encounter (Signed)
Pt needing a refill on:  amphetamine-dextroamphetamine (ADDERALL XR) 20 MG 24 hr capsule  Please fill at:  [id number] [place] [address], [place] - [address] [phone] (Phone) [phone] (Fax)   Thanks, [employer]

## 2018-04-30 MED ORDER — AMPHETAMINE-DEXTROAMPHET ER 20 MG PO CP24: 20.0000 mg | ORAL_CAPSULE | Freq: Every day | ORAL | 0 refills | Status: DC

## 2018-06-09 ENCOUNTER — Other Ambulatory Visit: Payer: Self-pay | Admitting: *Deleted

## 2018-06-09 DIAGNOSIS — F988 Other specified behavioral and emotional disorders with onset usually occurring in childhood and adolescence: Secondary | ICD-10-CM

## 2018-06-09 MED ORDER — AMPHETAMINE-DEXTROAMPHET ER 20 MG PO CP24: 20.0000 mg | ORAL_CAPSULE | Freq: Every day | ORAL | 0 refills | Status: DC

## 2018-07-03 ENCOUNTER — Ambulatory Visit: Payer: BLUE CROSS/BLUE SHIELD | Admitting: Physician Assistant

## 2018-07-03 ENCOUNTER — Encounter: Payer: Self-pay | Admitting: Physician Assistant

## 2018-07-03 ENCOUNTER — Other Ambulatory Visit: Payer: Self-pay

## 2018-07-03 VITALS — BP 108/75 | HR 83 | Temp 97.6°F | Resp 16 | Wt 119.2 lb

## 2018-07-03 DIAGNOSIS — H6981 Other specified disorders of Eustachian tube, right ear: Secondary | ICD-10-CM

## 2018-07-03 MED ORDER — FLUTICASONE FUROATE 27.5 MCG/SPRAY NA SUSP: 2.0000 | Freq: Every day | NASAL | 12 refills | Status: DC

## 2018-07-03 MED ORDER — PHENYLEPHRINE HCL 10 MG PO TABS: 10.0000 mg | ORAL_TABLET | ORAL | 0 refills | Status: DC | PRN

## 2018-07-03 NOTE — Progress Notes (Signed)
Patient: Shirley Rollins Female    DOB: [birth year]   38 y.o.   MRN: [id number] Visit Date: 07/03/2018  Today's Provider: [name], [name]   Chief Complaint  Patient presents with  . Ear Fullness   Subjective:     HPI  Patient here today with c/o fluid in right ear for the past three days. Denies fevers, chills, congestion, runny nose, post nasal drainage, cough, sneeze.   Allergies  Allergen Reactions  . Penicillin G Anaphylaxis  . Sulfa Antibiotics Other (See Comments)     Current Outpatient Medications:  .  amphetamine-dextroamphetamine (ADDERALL XR) 20 MG 24 hr capsule, Take 1 capsule (20 mg total) by mouth daily., Disp: 30 capsule, Rfl: 0 .  Multiple Vitamin (MULTIVITAMIN) tablet, Take 1 tablet by mouth daily., Disp: , Rfl:   Review of Systems  Constitutional: Negative.   HENT: Positive for ear pain. Negative for congestion, nosebleeds, postnasal drip, sinus pressure, sinus pain and sneezing.   Respiratory: Negative.   Cardiovascular: Negative.   Gastrointestinal: Negative.   Neurological: Negative.     Social History   Tobacco Use  . Smoking status: Current Every Day Smoker  . Smokeless tobacco: Never Used  . Tobacco comment: 3/4 of a pack per day  Substance Use Topics  . Alcohol use: Yes    Comment: occassional      Objective:   BP 108/75 (BP Location: Left Arm, Patient Position: Sitting, Cuff Size: Normal)   Pulse 83   Temp 97.6 F (36.4 C) (Oral)   Resp 16   Wt 119 lb 3.2 oz (54.1 kg)   LMP 06/27/2018   BMI 19.24 kg/m  Vitals:   07/03/18 1048  BP: 108/75  Pulse: 83  Resp: 16  Temp: 97.6 F (36.4 C)  TempSrc: Oral  Weight: 119 lb 3.2 oz (54.1 kg)     Physical Exam Vitals signs reviewed.  Constitutional:      General: She is not in acute distress.    Appearance: Normal appearance. She is well-developed. She is not ill-appearing or diaphoretic.  HENT:     Head: Normocephalic and atraumatic.     Right Ear: Hearing,  tympanic membrane, ear canal and external ear normal.     Left Ear: Hearing, tympanic membrane, ear canal and external ear normal.     Nose: Nose normal. No congestion.     Mouth/Throat:     Mouth: Mucous membranes are moist.     Pharynx: Uvula midline. No oropharyngeal exudate.  Eyes:     General: No scleral icterus.       Right eye: No discharge.        Left eye: No discharge.     Conjunctiva/sclera: Conjunctivae normal.     Pupils: Pupils are equal, round, and reactive to light.  Neck:     Musculoskeletal: Normal range of motion and neck supple.     Thyroid: No thyromegaly.     Trachea: No tracheal deviation.  Cardiovascular:     Rate and Rhythm: Normal rate and regular rhythm.     Heart sounds: Normal heart sounds. No murmur. No friction rub. No gallop.   Pulmonary:     Effort: Pulmonary effort is normal. No respiratory distress.     Breath sounds: Normal breath sounds. No stridor. No wheezing or rales.  Lymphadenopathy:     Cervical: No cervical adenopathy.  Skin:    General: Skin is warm and dry.  Neurological:  Mental Status: She is alert.         Assessment & Plan    1. Dysfunction of right eustachian tube Discussed process of ETD. Start floanse and sudafed as below. Discussed pressurizing exercises as well. Call if no improvement and may require ENT referral.  - fluticasone (FLONASE SENSIMIST) 27.5 MCG/SPRAY nasal spray; Place 2 sprays into the nose daily.  Dispense: 10 g; Refill: 12 - phenylephrine (SUDAFED PE SINUS CONGESTION) 10 MG TABS tablet; Take 1 tablet (10 mg total) by mouth every 4 (four) hours as needed.  Dispense: 42 tablet; Refill: 0     Margaretann LovelessJennifer M Burnette, [name]  [facility]

## 2018-07-03 NOTE — Patient Instructions (Signed)

## 2018-07-11 ENCOUNTER — Other Ambulatory Visit: Payer: Self-pay

## 2018-07-11 DIAGNOSIS — F988 Other specified behavioral and emotional disorders with onset usually occurring in childhood and adolescence: Secondary | ICD-10-CM

## 2018-07-11 MED ORDER — AMPHETAMINE-DEXTROAMPHET ER 20 MG PO CP24: 20.0000 mg | ORAL_CAPSULE | Freq: Every day | ORAL | 0 refills | Status: DC

## 2018-08-08 ENCOUNTER — Other Ambulatory Visit: Payer: Self-pay

## 2018-08-08 DIAGNOSIS — F988 Other specified behavioral and emotional disorders with onset usually occurring in childhood and adolescence: Secondary | ICD-10-CM

## 2018-08-08 MED ORDER — AMPHETAMINE-DEXTROAMPHET ER 20 MG PO CP24: 20.0000 mg | ORAL_CAPSULE | Freq: Every day | ORAL | 0 refills | Status: DC

## 2018-08-08 NOTE — Telephone Encounter (Signed)
Patient request refill. She will be out of medication after tomorrow.

## 2018-08-11 ENCOUNTER — Other Ambulatory Visit: Payer: Self-pay

## 2018-08-11 ENCOUNTER — Encounter: Payer: Self-pay | Admitting: Physician Assistant

## 2018-08-11 ENCOUNTER — Ambulatory Visit: Payer: BLUE CROSS/BLUE SHIELD | Admitting: Physician Assistant

## 2018-08-11 VITALS — BP 116/68 | HR 123 | Temp 98.4°F | Wt 116.8 lb

## 2018-08-11 DIAGNOSIS — S91109A Unspecified open wound of unspecified toe(s) without damage to nail, initial encounter: Secondary | ICD-10-CM | POA: Diagnosis not present

## 2018-08-11 MED ORDER — DOXYCYCLINE HYCLATE 100 MG PO TABS: 100.0000 mg | ORAL_TABLET | Freq: Two times a day (BID) | ORAL | 0 refills | Status: AC

## 2018-08-11 NOTE — Patient Instructions (Signed)
Wound Infection    A wound infection happens when germs start to grow in the wound. Germs that cause wound infections are most often bacteria. Other types of infections can occur as well. In some cases, infection can cause the wound to break open. Wound infections need treatment. If a wound infection is not treated, complications can happen.  Follow these instructions at home:  Medicines   Take or apply over-the-counter and prescription medicines only as told by your doctor.   If you were prescribed antibiotic medicine, take or apply it as told by your doctor. Do not stop using the antibiotic even if your condition improves.  Wound care   Clean the wound each day or as told by your doctor.  ? Wash the wound with mild soap and water.  ? Rinse the wound with water to remove all soap.  ? Pat the wound dry with a clean towel. Do not rub it.   Follow instructions from your doctor about how to take care of your wound. Make sure you:  ? Wash your hands with soap and water before you change your bandage (dressing). If you cannot use soap and water, use hand sanitizer.  ? Change your bandage as told by your doctor.  ? Leave stitches (sutures), skin glue, or skin tape (adhesive) strips in place if your wound has been closed. They may need to stay in place for 2 weeks or longer. If tape strips get loose and curl up, you may trim the loose edges. Do not remove tape strips completely unless your doctor says it is okay. Some wounds are left open to heal on their own.   Check your wound every day for signs of infection. Watch for:  ? More redness, swelling, or pain.  ? More fluid or blood.  ? Warmth.  ? Pus or a bad smell.  General instructions   Keep the bandage dry until your doctor says it can be removed.   Do not take baths, swim, use a hot tub, or do anything that would put your wound underwater until your doctor says it is okay.   Raise (elevate) the injured area above the level of your heart while you are sitting or  lying down.   Do not scratch or pick at the wound.   Keep all follow-up visits as told by your doctor. This is important.  Contact a doctor if:   Medicine does not help your pain.   You have more redness, swelling, or pain in the area of your wound.   You have more fluid or blood coming from your wound.   Your wound feels warm to the touch.   You have pus coming from your wound.   You continue to notice a bad smell coming from your wound or your bandage.   Your wound that was closed breaks open.  Get help right away if:   You have a red streak going away from your wound.   You have a fever.  This information is not intended to replace advice given to you by your health care provider. Make sure you discuss any questions you have with your health care provider.  Document Released: 11/22/2007 Document Revised: 07/21/2015 Document Reviewed: 08/02/2014  Elsevier Interactive Patient Education  2019 [employer]

## 2018-08-11 NOTE — Progress Notes (Signed)
       Patient: Shirley Rollins Female    DOB: 12-Apr-1980   38 y.o.   MRN: [id number] Visit Date: 08/11/2018  Today's Provider: Trinna Post, [name]   Chief Complaint  Patient presents with  . Insect Bite   Subjective:     HPI  Insect Bite Patient presents today for a possible spider bite. Patient states that the area is pus/ clear liquid, warm to touch,redness and swollen. Patient states she took some Aleve.  Allergies  Allergen Reactions  . Penicillin G Anaphylaxis  . Sulfa Antibiotics Other (See Comments)     Current Outpatient Medications:  .  amphetamine-dextroamphetamine (ADDERALL XR) 20 MG 24 hr capsule, Take 1 capsule (20 mg total) by mouth daily., Disp: 30 capsule, Rfl: 0 .  fluticasone (FLONASE SENSIMIST) 27.5 MCG/SPRAY nasal spray, Place 2 sprays into the nose daily., Disp: 10 g, Rfl: 12 .  Multiple Vitamin (MULTIVITAMIN) tablet, Take 1 tablet by mouth daily., Disp: , Rfl:  .  phenylephrine (SUDAFED PE SINUS CONGESTION) 10 MG TABS tablet, Take 1 tablet (10 mg total) by mouth every 4 (four) hours as needed., Disp: 42 tablet, Rfl: 0  Review of Systems    Social History   Tobacco Use  . Smoking status: Current Every Day Smoker  . Smokeless tobacco: Never Used  . Tobacco comment: 3/4 of a pack per day  Substance Use Topics  . Alcohol use: Yes    Comment: occassional      Objective:   BP 116/68 (BP Location: Left Arm, Patient Position: Sitting, Cuff Size: Normal)   Pulse (!) 123   Temp 98.4 F (36.9 C) (Oral)   Wt 116 lb 12.8 oz (53 kg)   LMP 07/24/2018   SpO2 97%   BMI 18.85 kg/m  Vitals:   08/11/18 1446  BP: 116/68  Pulse: (!) 123  Temp: 98.4 F (36.9 C)  TempSrc: Oral  SpO2: 97%  Weight: 116 lb 12.8 oz (53 kg)     Physical Exam Constitutional:      Appearance: Normal appearance.  Musculoskeletal:       Feet:  Skin:    General: Skin is warm and dry.     Findings: Erythema present.  Neurological:     Mental Status: She is alert  and oriented to person, place, and time. Mental status is at baseline.  Psychiatric:        Mood and Affect: Mood normal.        Behavior: Behavior normal.         Assessment & Plan    1. Open wound of toe, initial encounter  - doxycycline (VIBRA-TABS) 100 MG tablet; Take 1 tablet (100 mg total) by mouth 2 (two) times daily for 7 days.  Dispense: 14 tablet; Refill: 0  The entirety of the information documented in the History of Present Illness, Review of Systems and Physical Exam were personally obtained by me. Portions of this information were initially documented by [facility], CMA and reviewed by me for thoroughness and accuracy.   F/u PRn       Trinna Post, [name]  [facility]

## 2018-09-10 ENCOUNTER — Other Ambulatory Visit: Payer: Self-pay

## 2018-09-10 DIAGNOSIS — F988 Other specified behavioral and emotional disorders with onset usually occurring in childhood and adolescence: Secondary | ICD-10-CM

## 2018-09-10 MED ORDER — AMPHETAMINE-DEXTROAMPHET ER 20 MG PO CP24: 20.0000 mg | ORAL_CAPSULE | Freq: Every day | ORAL | 0 refills | Status: DC

## 2018-10-10 ENCOUNTER — Other Ambulatory Visit: Payer: Self-pay

## 2018-10-10 DIAGNOSIS — F988 Other specified behavioral and emotional disorders with onset usually occurring in childhood and adolescence: Secondary | ICD-10-CM

## 2018-10-10 MED ORDER — AMPHETAMINE-DEXTROAMPHET ER 20 MG PO CP24: 20.0000 mg | ORAL_CAPSULE | Freq: Every day | ORAL | 0 refills | Status: DC

## 2018-10-10 NOTE — Progress Notes (Signed)
Patient: Shirley LarssonJessica S Rollo, Female    DOB: 09/04/1980, 38 y.o.   MRN: [id number] Visit Date: 10/13/2018  Today's Provider: Margaretann LovelessJennifer M Courage Biglow, [name]   Chief Complaint  Patient presents with  . Annual Exam   Subjective:     Annual physical exam Shirley LarssonJessica S Klasen is a 38 y.o. female who presents today for health maintenance and complete physical. She feels well. She reports exercising. She reports she is sleeping well. -----------------------------------------------------------------   Review of Systems  Constitutional: Negative.   HENT: Negative.   Eyes: Negative.   Respiratory: Negative.   Cardiovascular: Negative.   Gastrointestinal: Negative.   Endocrine: Negative.   Genitourinary: Negative.   Musculoskeletal: Negative.   Skin: Negative.   Allergic/Immunologic: Negative.   Neurological: Positive for headaches.  Hematological: Negative.   Psychiatric/Behavioral: Negative.     Social History      She  reports that she quit smoking about 6 weeks ago. Her smoking use included cigarettes. She has never used smokeless tobacco. She reports current alcohol use. She reports that she does not use drugs.       Social History   Socioeconomic History  . Marital status: Divorced    Spouse name: Not on file  . Number of children: 1  . Years of education: Not on file  . Highest education level: Not on file  Occupational History  . Not on file  Social Needs  . Financial resource strain: Not on file  . Food insecurity    Worry: Not on file    Inability: Not on file  . Transportation needs    Medical: Not on file    Non-medical: Not on file  Tobacco Use  . Smoking status: Former Smoker    Types: Cigarettes    Quit date: 09/01/2018    Years since quitting: 0.1  . Smokeless tobacco: Never Used  . Tobacco comment: 3/4 of a pack per day  Substance and Sexual Activity  . Alcohol use: Yes    Comment: occassional  . Drug use: No  . Sexual activity: Not on file   Lifestyle  . Physical activity    Days per week: Not on file    Minutes per session: Not on file  . Stress: Not on file  Relationships  . Social Musicianconnections    Talks on phone: Not on file    Gets together: Not on file    Attends religious service: Not on file    Active member of club or organization: Not on file    Attends meetings of clubs or organizations: Not on file    Relationship status: Not on file  Other Topics Concern  . Not on file  Social History Narrative  . Not on file    Past Medical History:  Diagnosis Date  . Anemia   . Migraines      Patient Active Problem List   Diagnosis Date Noted  . Family history of pancreatic cancer 09/13/2017  . 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 Current tobacco use 11/26/2014  . Abnormal cells of cervix 11/18/2013    Past Surgical History:  Procedure Laterality Date  . etopic pregnancy  1999  . TONSILLECTOMY  in 8286's    Family History        Family Status  Relation Name Status  . Mother  Deceased at age 364  . Father  Deceased       cause of death: AIDS  .  Sister 1/2 sister Alive       substance abuse; Bipolar Disorder  . MGM  Deceased       Liver cancer  . MGF  Alive  . PGM  Deceased  . PGF  Deceased        Her family history includes Cancer in her mother; Diabetes in her mother; Pancreatic cancer (age of onset: 4564) in her mother.      Allergies  Allergen Reactions  . Penicillin G Anaphylaxis  . Sulfa Antibiotics Other (See Comments)     Current Outpatient Medications:  .  amphetamine-dextroamphetamine (ADDERALL XR) 20 MG 24 hr capsule, Take 1 capsule (20 mg total) by mouth daily., Disp: 30 capsule, Rfl: 0 .  fluticasone (FLONASE SENSIMIST) 27.5 MCG/SPRAY nasal spray, Place 2 sprays into the nose daily., Disp: 10 g, Rfl: 12 .  Multiple Vitamin (MULTIVITAMIN) tablet, Take 1 tablet by mouth daily., Disp: , Rfl:  .  phenylephrine (SUDAFED PE SINUS CONGESTION) 10 MG TABS  tablet, Take 1 tablet (10 mg total) by mouth every 4 (four) hours as needed., Disp: 42 tablet, Rfl: 0   Patient Care Team: Reine JustBurnette, Jiovani Mccammon M, [name] as PCP - General (Physician Assistant)    Objective:    Vitals: BP 108/74 (BP Location: Left Arm, Patient Position: Sitting, Cuff Size: Normal)   Pulse 94   Temp (!) 97 F (36.1 C) (Oral)   Resp 16   Ht 5' 5.5" (1.664 m)   Wt 113 lb 12.8 oz (51.6 kg)   BMI 18.65 kg/m    Vitals:   10/13/18 0915  BP: 108/74  Pulse: 94  Resp: 16  Temp: (!) 97 F (36.1 C)  TempSrc: Oral  Weight: 113 lb 12.8 oz (51.6 kg)  Height: 5' 5.5" (1.664 m)     Physical Exam Vitals signs reviewed.  Constitutional:      General: She is not in acute distress.    Appearance: Normal appearance. She is well-developed and normal weight. She is not diaphoretic.  HENT:     Head: Normocephalic and atraumatic.     Right Ear: Tympanic membrane, ear canal and external ear normal.     Left Ear: Tympanic membrane, ear canal and external ear normal.     Nose: Nose normal.     Mouth/Throat:     Mouth: Mucous membranes are moist.     Pharynx: Oropharynx is clear. No oropharyngeal exudate.  Eyes:     General: No scleral icterus.       Right eye: No discharge.        Left eye: No discharge.     Extraocular Movements: Extraocular movements intact.     Conjunctiva/sclera: Conjunctivae normal.     Pupils: Pupils are equal, round, and reactive to light.  Neck:     Musculoskeletal: Normal range of motion and neck supple.     Thyroid: No thyromegaly.     Vascular: No JVD.     Trachea: No tracheal deviation.  Cardiovascular:     Rate and Rhythm: Normal rate and regular rhythm.     Pulses: Normal pulses.     Heart sounds: Normal heart sounds. No murmur. No friction rub. No gallop.   Pulmonary:     Effort: Pulmonary effort is normal. No respiratory distress.     Breath sounds: Normal breath sounds. No wheezing or rales.  Chest:     Chest wall: No tenderness.   Abdominal:     General: Bowel sounds are normal. There is no  distension.     Palpations: Abdomen is soft. There is no mass.     Tenderness: There is no abdominal tenderness. There is no guarding or rebound.  Musculoskeletal: Normal range of motion.        General: No tenderness.     Right lower leg: No edema.     Left lower leg: No edema.  Lymphadenopathy:     Cervical: No cervical adenopathy.  Skin:    General: Skin is warm and dry.     Capillary Refill: Capillary refill takes less than 2 seconds.     Findings: No rash.  Neurological:     General: No focal deficit present.     Mental Status: She is alert and oriented to person, place, and time. Mental status is at baseline.     Cranial Nerves: No cranial nerve deficit.     Motor: No weakness.     Coordination: Coordination normal.     Gait: Gait normal.  Psychiatric:        Mood and Affect: Mood normal.        Behavior: Behavior normal.        Thought Content: Thought content normal.        Judgment: Judgment normal.      Depression Screen PHQ 2/9 Scores 10/13/2018 09/13/2017 09/12/2016  PHQ - 2 Score 1 1 2   PHQ- 9 Score 8 2 6        Assessment & Plan:     Routine Health Maintenance and Physical Exam  Exercise Activities and Dietary recommendations Goals   None     Immunization History  Administered Date(s) Administered  . Influenza,inj,Quad PF,6+ Mos 11/26/2014    Health Maintenance  Topic Date Due  . INFLUENZA VACCINE  09/27/2018  . PAP SMEAR-Modifier  09/25/2020  . TETANUS/TDAP  08/26/2021  . HIV Screening  Completed     Discussed health benefits of physical activity, and encouraged her to engage in regular exercise appropriate for her age and condition.    1. Annual physical exam Normal physical exam today. Will check labs as below and f/u pending lab results. If labs are stable and WNL she will not need to have these rechecked for one year at her next annual physical exam. She is to call the office  in the meantime if she has any acute issue, questions or concerns. - CBC with Differential/Platelet - Comprehensive metabolic panel - Hemoglobin A1c - Lipid panel - TSH  2. Breast cancer screening Breast exam today was normal. There is no family history of breast cancer. She does perform regular self breast exams. Mammogram was ordered as below. Information for [facility] was given to patient so she may schedule her mammogram at her convenience. - MM 3D SCREEN BREAST BILATERAL; Future  3. ADD (attention deficit disorder) without hyperactivity Stable on Adderall XR 20mg  daily.   4. Family history of pancreatic cancer Will check labs as below and f/u pending results. - Cancer antigen 19-9  --------------------------------------------------------------------    Mar Daring, [name]  [facility]

## 2018-10-13 ENCOUNTER — Ambulatory Visit (INDEPENDENT_AMBULATORY_CARE_PROVIDER_SITE_OTHER): Payer: BLUE CROSS/BLUE SHIELD | Admitting: Physician Assistant

## 2018-10-13 ENCOUNTER — Other Ambulatory Visit: Payer: Self-pay

## 2018-10-13 ENCOUNTER — Encounter: Payer: Self-pay | Admitting: Physician Assistant

## 2018-10-13 ENCOUNTER — Other Ambulatory Visit: Payer: Self-pay | Admitting: Physician Assistant

## 2018-10-13 VITALS — BP 108/74 | HR 94 | Temp 97.0°F | Resp 16 | Ht 65.5 in | Wt 113.8 lb

## 2018-10-13 DIAGNOSIS — Z1239 Encounter for other screening for malignant neoplasm of breast: Secondary | ICD-10-CM

## 2018-10-13 DIAGNOSIS — Z0000 Encounter for general adult medical examination without abnormal findings: Secondary | ICD-10-CM | POA: Diagnosis not present

## 2018-10-13 DIAGNOSIS — F988 Other specified behavioral and emotional disorders with onset usually occurring in childhood and adolescence: Secondary | ICD-10-CM

## 2018-10-13 DIAGNOSIS — Z800 Family history of malignant neoplasm of digestive organs: Secondary | ICD-10-CM | POA: Diagnosis not present

## 2018-10-13 NOTE — Patient Instructions (Signed)
Health Maintenance, Female Adopting a healthy lifestyle and getting preventive care are important in promoting health and wellness. Ask your health care provider about:  The right schedule for you to have regular tests and exams.  Things you can do on your own to prevent diseases and keep yourself healthy. What should I know about diet, weight, and exercise? Eat a healthy diet   Eat a diet that includes plenty of vegetables, fruits, low-fat dairy products, and lean protein.  Do not eat a lot of foods that are high in solid fats, added sugars, or sodium. Maintain a healthy weight Body mass index (BMI) is used to identify weight problems. It estimates body fat based on height and weight. Your health care provider can help determine your BMI and help you achieve or maintain a healthy weight. Get regular exercise Get regular exercise. This is one of the most important things you can do for your health. Most adults should:  Exercise for at least 150 minutes each week. The exercise should increase your heart rate and make you sweat (moderate-intensity exercise).  Do strengthening exercises at least twice a week. This is in addition to the moderate-intensity exercise.  Spend less time sitting. Even light physical activity can be beneficial. Watch cholesterol and blood lipids Have your blood tested for lipids and cholesterol at 40 years of age, then have this test every 5 years. Have your cholesterol levels checked more often if:  Your lipid or cholesterol levels are high.  You are older than 40 years of age.  You are at high risk for heart disease. What should I know about cancer screening? Depending on your health history and family history, you may need to have cancer screening at various ages. This may include screening for:  Breast cancer.  Cervical cancer.  Colorectal cancer.  Skin cancer.  Lung cancer. What should I know about heart disease, diabetes, and high blood  pressure? Blood pressure and heart disease  High blood pressure causes heart disease and increases the risk of stroke. This is more likely to develop in people who have high blood pressure readings, are of African descent, or are overweight.  Have your blood pressure checked: ? Every 3-5 years if you are 18-39 years of age. ? Every year if you are 40 years old or older. Diabetes Have regular diabetes screenings. This checks your fasting blood sugar level. Have the screening done:  Once every three years after age 40 if you are at a normal weight and have a low risk for diabetes.  More often and at a younger age if you are overweight or have a high risk for diabetes. What should I know about preventing infection? Hepatitis B If you have a higher risk for hepatitis B, you should be screened for this virus. Talk with your health care provider to find out if you are at risk for hepatitis B infection. Hepatitis C Testing is recommended for:  Everyone born from 1945 through 1965.  Anyone with known risk factors for hepatitis C. Sexually transmitted infections (STIs)  Get screened for STIs, including gonorrhea and chlamydia, if: ? You are sexually active and are younger than 40 years of age. ? You are older than 40 years of age and your health care provider tells you that you are at risk for this type of infection. ? Your sexual activity has changed since you were last screened, and you are at increased risk for chlamydia or gonorrhea. Ask your health care provider if   you are at risk.  Ask your health care provider about whether you are at high risk for HIV. Your health care provider may recommend a prescription medicine to help prevent HIV infection. If you choose to take medicine to prevent HIV, you should first get tested for HIV. You should then be tested every 3 months for as long as you are taking the medicine. Pregnancy  If you are about to stop having your period (premenopausal) and  you may become pregnant, seek counseling before you get pregnant.  Take 400 to 800 micrograms (mcg) of folic acid every day if you become pregnant.  Ask for birth control (contraception) if you want to prevent pregnancy. Osteoporosis and menopause Osteoporosis is a disease in which the bones lose minerals and strength with aging. This can result in bone fractures. If you are 65 years old or older, or if you are at risk for osteoporosis and fractures, ask your health care provider if you should:  Be screened for bone loss.  Take a calcium or vitamin D supplement to lower your risk of fractures.  Be given hormone replacement therapy (HRT) to treat symptoms of menopause. Follow these instructions at home: Lifestyle  Do not use any products that contain nicotine or tobacco, such as cigarettes, e-cigarettes, and chewing tobacco. If you need help quitting, ask your health care provider.  Do not use street drugs.  Do not share needles.  Ask your health care provider for help if you need support or information about quitting drugs. Alcohol use  Do not drink alcohol if: ? Your health care provider tells you not to drink. ? You are pregnant, may be pregnant, or are planning to become pregnant.  If you drink alcohol: ? Limit how much you use to 0-1 drink a day. ? Limit intake if you are breastfeeding.  Be aware of how much alcohol is in your drink. In the [place] one drink equals one 12 oz bottle of beer (355 mL), one 5 oz glass of wine (148 mL), or one 1 oz glass of hard liquor (44 mL). General instructions  Schedule regular health, dental, and eye exams.  Stay current with your vaccines.  Tell your health care provider if: ? You often feel depressed. ? You have ever been abused or do not feel safe at home. Summary  Adopting a healthy lifestyle and getting preventive care are important in promoting health and wellness.  Follow your health care provider's instructions about healthy  diet, exercising, and getting tested or screened for diseases.  Follow your health care provider's instructions on monitoring your cholesterol and blood pressure. This information is not intended to replace advice given to you by your health care provider. Make sure you discuss any questions you have with your health care provider. Document Released: 08/28/2010 Document Revised: 02/05/2018 Document Reviewed: 02/05/2018 Elsevier Patient Education  2020 Elsevier Inc.  

## 2018-10-14 ENCOUNTER — Telehealth: Payer: Self-pay

## 2018-10-14 LAB — CBC WITH DIFFERENTIAL/PLATELET
Basophils Absolute: 0.1 10*3/uL (ref 0.0–0.2)
Basos: 1 %
EOS (ABSOLUTE): 0.1 10*3/uL (ref 0.0–0.4)
Eos: 1 %
Hematocrit: 39.6 % (ref 34.0–46.6)
Hemoglobin: 13.3 g/dL (ref 11.1–15.9)
Immature Grans (Abs): 0 10*3/uL (ref 0.0–0.1)
Immature Granulocytes: 0 %
Lymphocytes Absolute: 1.8 10*3/uL (ref 0.7–3.1)
Lymphs: 33 %
MCH: 32.2 pg (ref 26.6–33.0)
MCHC: 33.6 g/dL (ref 31.5–35.7)
MCV: 96 fL (ref 79–97)
Monocytes Absolute: 0.5 10*3/uL (ref 0.1–0.9)
Monocytes: 9 %
Neutrophils Absolute: 3.1 10*3/uL (ref 1.4–7.0)
Neutrophils: 56 %
Platelets: 305 10*3/uL (ref 150–450)
RBC: 4.13 x10E6/uL (ref 3.77–5.28)
RDW: 12 % (ref 11.7–15.4)
WBC: 5.6 10*3/uL (ref 3.4–10.8)

## 2018-10-14 LAB — COMPREHENSIVE METABOLIC PANEL
ALT: 13 IU/L (ref 0–32)
AST: 20 IU/L (ref 0–40)
Albumin/Globulin Ratio: 2.3 — ABNORMAL HIGH (ref 1.2–2.2)
Albumin: 5 g/dL — ABNORMAL HIGH (ref 3.8–4.8)
Alkaline Phosphatase: 38 IU/L — ABNORMAL LOW (ref 39–117)
BUN/Creatinine Ratio: 13 (ref 9–23)
BUN: 9 mg/dL (ref 6–24)
Bilirubin Total: 0.6 mg/dL (ref 0.0–1.2)
CO2: 23 mmol/L (ref 20–29)
Calcium: 10.1 mg/dL (ref 8.7–10.2)
Chloride: 100 mmol/L (ref 96–106)
Creatinine, Ser: 0.69 mg/dL (ref 0.57–1.00)
GFR calc Af Amer: 126 mL/min/{1.73_m2} (ref >59)
GFR calc non Af Amer: 109 mL/min/{1.73_m2} (ref >59)
Globulin, Total: 2.2 g/dL (ref 1.5–4.5)
Glucose: 93 mg/dL (ref 65–99)
Potassium: 4.3 mmol/L (ref 3.5–5.2)
Sodium: 139 mmol/L (ref 134–144)
Total Protein: 7.2 g/dL (ref 6.0–8.5)

## 2018-10-14 LAB — HEMOGLOBIN A1C
Est. average glucose Bld gHb Est-mCnc: 100 mg/dL
Hgb A1c MFr Bld: 5.1 % (ref 4.8–5.6)

## 2018-10-14 LAB — LIPID PANEL
Chol/HDL Ratio: 3.5 ratio (ref 0.0–4.4)
Cholesterol, Total: 187 mg/dL (ref 100–199)
HDL: 54 mg/dL (ref >39)
LDL Calculated: 106 mg/dL — ABNORMAL HIGH (ref 0–99)
Triglycerides: 134 mg/dL (ref 0–149)
VLDL Cholesterol Cal: 27 mg/dL (ref 5–40)

## 2018-10-14 LAB — CANCER ANTIGEN 19-9: CA 19-9: 23 U/mL (ref 0–35)

## 2018-10-14 LAB — TSH: TSH: 1.65 u[IU]/mL (ref 0.450–4.500)

## 2018-10-14 NOTE — Telephone Encounter (Signed)
-----   Message from Mar Daring, [place] sent at 10/14/2018  1:56 PM EDT ----- All labs are within normal limits and stable.  Thanks! -JB

## 2018-10-14 NOTE — Telephone Encounter (Signed)
LMTCB or to view labs thru my chart.

## 2018-10-15 NOTE — Telephone Encounter (Signed)
Patient advised as directed below. 

## 2018-11-13 ENCOUNTER — Other Ambulatory Visit: Payer: Self-pay | Admitting: Physician Assistant

## 2018-11-13 DIAGNOSIS — F988 Other specified behavioral and emotional disorders with onset usually occurring in childhood and adolescence: Secondary | ICD-10-CM

## 2018-11-13 MED ORDER — AMPHETAMINE-DEXTROAMPHET ER 20 MG PO CP24: 20.0000 mg | ORAL_CAPSULE | Freq: Every day | ORAL | 0 refills | Status: DC

## 2018-11-13 NOTE — Telephone Encounter (Signed)
Pt needs refill Adderall XR 20 mg  [facility] [address]  [place]

## 2018-12-12 ENCOUNTER — Other Ambulatory Visit: Payer: Self-pay

## 2018-12-12 DIAGNOSIS — F988 Other specified behavioral and emotional disorders with onset usually occurring in childhood and adolescence: Secondary | ICD-10-CM

## 2018-12-12 MED ORDER — AMPHETAMINE-DEXTROAMPHET ER 20 MG PO CP24: 20.0000 mg | ORAL_CAPSULE | Freq: Every day | ORAL | 0 refills | Status: DC

## 2019-01-13 ENCOUNTER — Other Ambulatory Visit: Payer: Self-pay | Admitting: Physician Assistant

## 2019-01-13 DIAGNOSIS — F988 Other specified behavioral and emotional disorders with onset usually occurring in childhood and adolescence: Secondary | ICD-10-CM

## 2019-01-13 MED ORDER — AMPHETAMINE-DEXTROAMPHET ER 20 MG PO CP24: 20.0000 mg | ORAL_CAPSULE | Freq: Every day | ORAL | 0 refills | Status: DC

## 2019-01-13 NOTE — Telephone Encounter (Signed)
Medication Refill - Medication:  amphetamine-dextroamphetamine (ADDERALL XR) 20 MG 24 hr capsule  Has the patient contacted their pharmacy? Yes advised to call office.   Preferred Pharmacy (with phone number or street name):  [place], [address], [place], [place] [address]  Agent: Please be advised that RX refills may take up to 3 business days. We ask that you follow-up with your pharmacy.

## 2019-01-13 NOTE — Telephone Encounter (Signed)
Requested medication (s) are due for refill today: yes  Requested medication (s) are on the active medication list: yes  Last refill:  12/12/2018  Future visit scheduled: no  Notes to clinic: refill cannot be delegated    Requested Prescriptions  Pending Prescriptions Disp Refills   amphetamine-dextroamphetamine (ADDERALL XR) 20 MG 24 hr capsule 30 capsule 0    Sig: Take 1 capsule (20 mg total) by mouth daily.     Not Delegated - Psychiatry:  Stimulants/ADHD Failed - 01/13/2019  8:42 AM      Failed - This refill cannot be delegated      Failed - Urine Drug Screen completed in last 360 days.      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

## 2019-02-13 ENCOUNTER — Other Ambulatory Visit: Payer: Self-pay | Admitting: Physician Assistant

## 2019-02-13 DIAGNOSIS — F988 Other specified behavioral and emotional disorders with onset usually occurring in childhood and adolescence: Secondary | ICD-10-CM

## 2019-02-13 MED ORDER — AMPHETAMINE-DEXTROAMPHET ER 20 MG PO CP24: 20.0000 mg | ORAL_CAPSULE | Freq: Every day | ORAL | 0 refills | Status: DC

## 2019-02-13 NOTE — Telephone Encounter (Signed)
Medication refill: amphetamine-dextroamphetamine (ADDERALL XR) 20 MG 24 hr capsule [[id number]]   Pt would like to know if she can have this filled today and be able to pick this up today. She will not be able to pick this up this weekend because of pharmacy hours.    Pharmacy: [facility] [place]   Pt aware of turn around time.

## 2019-02-13 NOTE — Telephone Encounter (Signed)
Requested medication (s) are due for refill today:yes  Requested medication (s) are on the active medication list: yes  Last refill: 01/13/2019  Future visit scheduled: no  Notes to clinic: refill cannot be delegated Patient would like to pick up today as she will not be able to get from pharmacy this weekend   Requested Prescriptions  Pending Prescriptions Disp Refills   amphetamine-dextroamphetamine (ADDERALL XR) 20 MG 24 hr capsule 30 capsule 0    Sig: Take 1 capsule (20 mg total) by mouth daily.      Not Delegated - Psychiatry:  Stimulants/ADHD Failed - 02/13/2019  8:32 AM      Failed - This refill cannot be delegated      Failed - Urine Drug Screen completed in last 360 days.      Failed - Valid encounter within last 3 months    Recent Outpatient Visits           4 months ago Annual physical exam   [place], [place]   6 months ago Open wound of toe, initial encounter   [place], [place]   7 months ago Dysfunction of right eustachian tube   [place], [place]   10 months ago ADD (attention deficit disorder) without hyperactivity   [place], [place]   1 year ago Encounter for Papanicolaou smear for cervical cancer screening   [facility] Mar Daring, [place]

## 2019-02-13 NOTE — Telephone Encounter (Signed)
Patient is calling to see if the refill for Adderall XR 20MG  24hr can be sent to [place]. [facility] was sent [facility] [address] [place]. In error.

## 2019-02-16 MED ORDER — AMPHETAMINE-DEXTROAMPHET ER 20 MG PO CP24: 20.0000 mg | ORAL_CAPSULE | Freq: Every day | ORAL | 0 refills | Status: DC

## 2019-02-16 NOTE — Telephone Encounter (Signed)
Changed.

## 2019-02-16 NOTE — Addendum Note (Signed)
Addended by: Mar Daring on: 02/16/2019 10:37 AM   Modules accepted: Orders

## 2019-03-18 ENCOUNTER — Other Ambulatory Visit: Payer: Self-pay | Admitting: Physician Assistant

## 2019-03-18 DIAGNOSIS — F988 Other specified behavioral and emotional disorders with onset usually occurring in childhood and adolescence: Secondary | ICD-10-CM

## 2019-03-18 MED ORDER — AMPHETAMINE-DEXTROAMPHET ER 20 MG PO CP24: 20.0000 mg | ORAL_CAPSULE | Freq: Every day | ORAL | 0 refills | Status: DC

## 2019-03-18 NOTE — Telephone Encounter (Signed)
Medication Refill - Medication:  amphetamine-dextroamphetamine (ADDERALL XR) 20 MG 24 hr capsule  Has the patient contacted their pharmacy?  Yes advised to call office.  Preferred Pharmacy (with phone number or street name):  [facility] - [place], [place] - [address]  [address] [place] [address]  Phone:  [phone] Fax:  [phone]   Agent: Please be advised that RX refills may take up to 3 business days. We ask that you follow-up with your pharmacy.

## 2019-04-20 ENCOUNTER — Other Ambulatory Visit: Payer: Self-pay | Admitting: Physician Assistant

## 2019-04-20 DIAGNOSIS — F988 Other specified behavioral and emotional disorders with onset usually occurring in childhood and adolescence: Secondary | ICD-10-CM

## 2019-04-20 MED ORDER — AMPHETAMINE-DEXTROAMPHET ER 20 MG PO CP24: 20.0000 mg | ORAL_CAPSULE | Freq: Every day | ORAL | 0 refills | Status: DC

## 2019-04-20 NOTE — Telephone Encounter (Signed)
Pt is calling and need refill on generic adderall xr 20 mg. [facility]

## 2019-05-20 ENCOUNTER — Other Ambulatory Visit: Payer: Self-pay | Admitting: Physician Assistant

## 2019-05-20 DIAGNOSIS — F988 Other specified behavioral and emotional disorders with onset usually occurring in childhood and adolescence: Secondary | ICD-10-CM

## 2019-05-20 MED ORDER — AMPHETAMINE-DEXTROAMPHET ER 20 MG PO CP24: 20.0000 mg | ORAL_CAPSULE | Freq: Every day | ORAL | 0 refills | Status: DC

## 2019-05-20 NOTE — Telephone Encounter (Signed)
LOV  10/13/18 CPE  LRF  04/20/19  # [phone]

## 2019-05-20 NOTE — Telephone Encounter (Signed)
Copied from CRM [id number]. Topic: Quick Communication - Rx Refill/Question >> May 20, 2019  8:31 AM Jaquita Rector A wrote: Medication: amphetamine-dextroamphetamine (ADDERALL XR) 20 MG 24 hr capsule   Has the patient contacted their pharmacy? Yes.   (Agent: If no, request that the patient contact the pharmacy for the refill.) (Agent: If yes, when and what did the pharmacy advise?)  Preferred Pharmacy (with phone number or street name): [facility] - [place], [place] - [address]  Phone:  [phone] Fax:  [phone]     Agent: Please be advised that RX refills may take up to 3 business days. We ask that you follow-up with your pharmacy.

## 2019-05-20 NOTE — Telephone Encounter (Signed)
Requested medication (s) are due for refill today -yes  Requested medication (s) are on the active medication list -yes  Future visit scheduled -no  Last refill: 04/20/19  Notes to clinic: Patient request for non delegated Rx  Requested Prescriptions  Pending Prescriptions Disp Refills   amphetamine-dextroamphetamine (ADDERALL XR) 20 MG 24 hr capsule 30 capsule 0    Sig: Take 1 capsule (20 mg total) by mouth daily.      Not Delegated - Psychiatry:  Stimulants/ADHD Failed - 05/20/2019  8:38 AM      Failed - This refill cannot be delegated      Failed - Urine Drug Screen completed in last 360 days.      Failed - Valid encounter within last 3 months    Recent Outpatient Visits           7 months ago Annual physical exam   [facility] Joycelyn Man M, [place]   9 months ago Open wound of toe, initial encounter   [facility] Osvaldo Angst M, [place]   10 months ago Dysfunction of right eustachian tube   [facility] [place], Alessandra Bevels, [place]   1 year ago ADD (attention deficit disorder) without hyperactivity   [facility], [place], [place]   1 year ago Encounter for Papanicolaou smear for cervical cancer screening   [facility] Joycelyn Man M, [place]                  Requested Prescriptions  Pending Prescriptions Disp Refills   amphetamine-dextroamphetamine (ADDERALL XR) 20 MG 24 hr capsule 30 capsule 0    Sig: Take 1 capsule (20 mg total) by mouth daily.      Not Delegated - Psychiatry:  Stimulants/ADHD Failed - 05/20/2019  8:38 AM      Failed - This refill cannot be delegated      Failed - Urine Drug Screen completed in last 360 days.      Failed - Valid encounter within last 3 months    Recent Outpatient Visits           7 months ago Annual physical exam   [facility] Joycelyn Man M, [place]   9 months ago Open wound of toe, initial encounter   [facility] Osvaldo Angst M, [place]   10 months ago Dysfunction of right eustachian tube   [facility] [place], Alessandra Bevels, [place]   1 year ago ADD (attention deficit disorder) without hyperactivity   [facility], Alessandra Bevels, [place]   1 year ago Encounter for Papanicolaou smear for cervical cancer screening   [facility] [name], [place]

## 2019-06-19 ENCOUNTER — Other Ambulatory Visit: Payer: Self-pay | Admitting: Physician Assistant

## 2019-06-19 DIAGNOSIS — F988 Other specified behavioral and emotional disorders with onset usually occurring in childhood and adolescence: Secondary | ICD-10-CM

## 2019-06-19 MED ORDER — AMPHETAMINE-DEXTROAMPHET ER 20 MG PO CP24: 20.0000 mg | ORAL_CAPSULE | Freq: Every day | ORAL | 0 refills | Status: DC

## 2019-06-19 NOTE — Telephone Encounter (Signed)
Requested medication (s) are due for refill today: yes  Requested medication (s) are on the active medication list: yes  Last refill:  05/20/2019  Future visit scheduled: no  Notes to clinic: this refill cannot be delegated    Requested Prescriptions  Pending Prescriptions Disp Refills   amphetamine-dextroamphetamine (ADDERALL XR) 20 MG 24 hr capsule 30 capsule 0    Sig: Take 1 capsule (20 mg total) by mouth daily.      Not Delegated - Psychiatry:  Stimulants/ADHD Failed - 06/19/2019 10:16 AM      Failed - This refill cannot be delegated      Failed - Urine Drug Screen completed in last 360 days.      Failed - Valid encounter within last 3 months    Recent Outpatient Visits           8 months ago Annual physical exam   [facility] Joycelyn Man M, [place]   10 months ago Open wound of toe, initial encounter   [facility] Osvaldo Angst M, [place]   11 months ago Dysfunction of right eustachian tube   [facility] [place], Alessandra Bevels, [place]   1 year ago ADD (attention deficit disorder) without hyperactivity   [facility] [place], Alessandra Bevels, [place]   1 year ago Encounter for Papanicolaou smear for cervical cancer screening   [facility] [name], [place]

## 2019-06-19 NOTE — Telephone Encounter (Signed)
Medication Refill - Medication: amphetamine-dextroamphetamine (ADDERALL XR) 20 MG 24 hr capsule  Has the patient contacted their pharmacy? No. (Agent: If no, request that the patient contact the pharmacy for the refill.) (Agent: If yes, when and what did the pharmacy advise?)  Preferred Pharmacy (with phone number or street name):  [facility] - [place], [place] - [address] Phone:  [phone]  Fax:  [phone]       Agent: Please be advised that RX refills may take up to 3 business days. We ask that you follow-up with your pharmacy.

## 2019-06-29 ENCOUNTER — Other Ambulatory Visit: Payer: BLUE CROSS/BLUE SHIELD

## 2019-07-01 ENCOUNTER — Ambulatory Visit: Payer: Self-pay | Attending: Internal Medicine

## 2019-07-01 DIAGNOSIS — Z20822 Contact with and (suspected) exposure to covid-19: Secondary | ICD-10-CM

## 2019-07-03 LAB — NOVEL CORONAVIRUS, NAA: SARS-CoV-2, NAA: NOT DETECTED

## 2019-07-03 LAB — SARS-COV-2, NAA 2 DAY TAT

## 2019-07-22 ENCOUNTER — Other Ambulatory Visit: Payer: Self-pay | Admitting: Physician Assistant

## 2019-07-22 DIAGNOSIS — F988 Other specified behavioral and emotional disorders with onset usually occurring in childhood and adolescence: Secondary | ICD-10-CM

## 2019-07-22 MED ORDER — AMPHETAMINE-DEXTROAMPHET ER 20 MG PO CP24: 20.0000 mg | ORAL_CAPSULE | Freq: Every day | ORAL | 0 refills | Status: DC

## 2019-07-22 NOTE — Telephone Encounter (Signed)
Patient is requesting a prescription refill for Adderall [facility]

## 2019-07-22 NOTE — Telephone Encounter (Signed)
Requested medication (s) are due for refill today -yes  Requested medication (s) are on the active medication list -yes  Future visit scheduled -no  Last refill: 06/19/19  Notes to clinic: Request for non delegated Rx  Requested Prescriptions  Pending Prescriptions Disp Refills   amphetamine-dextroamphetamine (ADDERALL XR) 20 MG 24 hr capsule 30 capsule 0    Sig: Take 1 capsule (20 mg total) by mouth daily.      Not Delegated - Psychiatry:  Stimulants/ADHD Failed - 07/22/2019  8:26 AM      Failed - This refill cannot be delegated      Failed - Urine Drug Screen completed in last 360 days.      Failed - Valid encounter within last 3 months    Recent Outpatient Visits           9 months ago Annual physical exam   [facility] Joycelyn Man M, [place]   11 months ago Open wound of toe, initial encounter   [facility] [place], Lavella Hammock, [place]   1 year ago Dysfunction of right eustachian tube   [facility], Alessandra Bevels, [place]   1 year ago ADD (attention deficit disorder) without hyperactivity   [facility] [place], [place], [place]   1 year ago Encounter for Papanicolaou smear for cervical cancer screening   [facility] Joycelyn Man M, [place]                  Requested Prescriptions  Pending Prescriptions Disp Refills   amphetamine-dextroamphetamine (ADDERALL XR) 20 MG 24 hr capsule 30 capsule 0    Sig: Take 1 capsule (20 mg total) by mouth daily.      Not Delegated - Psychiatry:  Stimulants/ADHD Failed - 07/22/2019  8:26 AM      Failed - This refill cannot be delegated      Failed - Urine Drug Screen completed in last 360 days.      Failed - Valid encounter within last 3 months    Recent Outpatient Visits           9 months ago Annual physical exam   [facility] Joycelyn Man M, [place]   11 months ago Open wound of toe, initial encounter   [facility] [place], [place], [place]   1 year ago Dysfunction of right eustachian tube   [facility] [place], Alessandra Bevels, [place]   1 year ago ADD (attention deficit disorder) without hyperactivity   [facility] [place], Alessandra Bevels, [name]   1 year ago Encounter for Papanicolaou smear for cervical cancer screening   [facility] [name], [place]

## 2019-08-20 ENCOUNTER — Other Ambulatory Visit: Payer: Self-pay | Admitting: Physician Assistant

## 2019-08-20 DIAGNOSIS — F988 Other specified behavioral and emotional disorders with onset usually occurring in childhood and adolescence: Secondary | ICD-10-CM

## 2019-08-20 MED ORDER — AMPHETAMINE-DEXTROAMPHET ER 20 MG PO CP24: 20.0000 mg | ORAL_CAPSULE | Freq: Every day | ORAL | 0 refills | Status: DC

## 2019-08-20 NOTE — Telephone Encounter (Signed)
Copied from CRM [id number]. Topic: Quick Communication - Rx Refill/Question >> Aug 20, 2019  8:17 AM Jaquita Rector A wrote: Medication: amphetamine-dextroamphetamine (ADDERALL XR) 20 MG 24 hr capsule    Has the patient contacted their pharmacy? Yes.   (Agent: If no, request that the patient contact the pharmacy for the refill.) (Agent: If yes, when and what did the pharmacy advise?)  Preferred Pharmacy (with phone number or street name): [facility] - [place], [place] - [address]  Phone:  [phone] Fax:  [phone]     Agent: Please be advised that RX refills may take up to 3 business days. We ask that you follow-up with your pharmacy.

## 2019-08-27 ENCOUNTER — Telehealth: Payer: Self-pay

## 2019-08-27 DIAGNOSIS — Z1239 Encounter for other screening for malignant neoplasm of breast: Secondary | ICD-10-CM

## 2019-08-27 NOTE — Telephone Encounter (Signed)
Copied from CRM [id number]. Topic: General - Other >> Aug 27, 2019  8:29 AM Elliot Gault wrote: Patient states she has [employer] therefore requesting new annual mamo orders please place with [place] [facility]. Patient would like a follow up call when done

## 2019-08-27 NOTE — Telephone Encounter (Signed)
NA. Mailbox is full. Unable to leave message.

## 2019-08-27 NOTE — Telephone Encounter (Signed)
Is this okay to order?  Thanks,   -Takeo Harts  

## 2019-08-27 NOTE — Telephone Encounter (Signed)
Ordered. She can call [place] to schedule. 

## 2019-09-02 NOTE — Telephone Encounter (Signed)
Advised patient as below. Patient is wanting to be seen one day next week at a "late" appt. Would you be willing to use a same day appt? Please advise thanks!

## 2019-09-02 NOTE — Telephone Encounter (Signed)
Does patient need office visit for evaluation of new lump before we can order diagnostic mammo?

## 2019-09-02 NOTE — Telephone Encounter (Signed)
Yes she can go in the late slot on Thursday, friday

## 2019-09-02 NOTE — Telephone Encounter (Signed)
Has to be seen for lump as we have to document the location on the breast for the diagnostic order.

## 2019-09-02 NOTE — Telephone Encounter (Signed)
Pt called stating that she did find a lump and that she will need a referral for a bilateral diagnostic mammogram. Please advise.

## 2019-09-03 NOTE — Telephone Encounter (Signed)
LMTCB if patient calls back ok for [facility] nurse/adm to schedule patient for next week on a late appt. (same day) slot.

## 2019-09-03 NOTE — Telephone Encounter (Signed)
Routing to office to make aware that appt. has been scheduled.

## 2019-09-03 NOTE — Telephone Encounter (Signed)
Pt has been sch for 09/10/2019 at 520 pm

## 2019-09-10 ENCOUNTER — Encounter: Payer: Self-pay | Admitting: Physician Assistant

## 2019-09-10 ENCOUNTER — Other Ambulatory Visit: Payer: Self-pay

## 2019-09-10 ENCOUNTER — Ambulatory Visit: Payer: Self-pay | Admitting: Physician Assistant

## 2019-09-10 ENCOUNTER — Ambulatory Visit (INDEPENDENT_AMBULATORY_CARE_PROVIDER_SITE_OTHER): Payer: No Typology Code available for payment source | Admitting: Physician Assistant

## 2019-09-10 VITALS — BP 118/80 | HR 66 | Temp 97.3°F | Resp 16 | Wt 112.7 lb

## 2019-09-10 DIAGNOSIS — N6310 Unspecified lump in the right breast, unspecified quadrant: Secondary | ICD-10-CM | POA: Diagnosis not present

## 2019-09-10 NOTE — Patient Instructions (Signed)
Breast Cyst  A breast cyst is a sac in the breast that is filled with fluid. They are usually noncancerous (benign) and are common among women. Breast cysts are most often in the upper, outer portion of the breast. One or more cysts may develop. They form when fluid builds up inside the breast glands. There are several types of breast cysts. Some are too small to feel, but these can be seen with imaging tests such as an X-ray of the breast (mammogram) or ultrasound. Breast cysts do not increase your risk of breast cancer. They usually disappear after you no longer have a menstrual cycle (after menopause), unless you take artificial hormones (are on hormone therapy). What are the causes? This condition may be caused by:  Blockage of tubes (ducts) in the breast glands, which leads to fluid buildup. Duct blockage may result from: ? Fibrocystic breast changes. This is a common, benign condition that occurs when women go through hormonal changes during the menstrual cycle. This is a common cause of multiple breast cysts. ? Overgrowth of breast tissue or breast glands. ? Scar tissue in the breast from previous surgery.  Changes in certain female hormones (estrogen and progesterone). The exact cause of this condition is not known. What increases the risk? You may be more likely to develop breast cysts if you have not gone through menopause. What are the signs or symptoms? Symptoms of this condition include:  Feeling one or more smooth, round, soft lumps (like grapes) in the breast that are easily movable. The lump or lumps may get bigger and more painful before your menstrual period and get smaller after your menstrual period.  Breast discomfort or pain. How is this diagnosed? This condition may be diagnosed based on:  A physical exam. A cyst can be felt by your health care provider during this exam.  Imaging tests, such as mammogram or ultrasound. Fluid may be removed from the cyst with a  needle (fine-needle aspiration) and tested to make sure the cyst is not cancerous. How is this treated? Treatment may not be needed for this condition. Your health care provider may monitor the cyst to see if it goes away on its own. If the cyst is uncomfortable or gets bigger, or if you do not like how the cyst makes your breast look, you may need treatment. Treatment may include:  Hormone therapy.  Fine-needle aspiration to drain fluid from the cyst. There is a chance of the cyst coming back (recurring) after aspiration.  Surgery to remove the cyst. Follow these instructions at home: Self-exams   Do a breast self-exam every month, or as often as directed. A breast self-exam involves: ? Comparing your breasts in the mirror. ? Looking for visible changes in your skin or nipples. ? Feeling for lumps or changes.  Having many breast cysts may make it harder to feel for new lumps. Understand how your breasts normally look and feel, and write down any changes in your breasts. Tell your health care provider about any changes. Eating and drinking  Follow instructions from your health care provider about eating and drinking restrictions.  Drink enough fluid to keep your urine pale yellow.  Avoid caffeine.  Cut down on salt (sodium) in what you eat and drink, especially before your menstrual period. Too much sodium can cause fluid buildup, breast swelling, and discomfort. General instructions  See your health care provider regularly. ? Get a yearly physical exam. ? If you are 20-40 years old, get a   clinical breast exam every 1-3 years. After the age of 40 years, get this exam every year. ? Get mammograms as often as directed.  Take over-the-counter and prescription medicines only as told by your health care provider.  Wear a supportive bra, especially when exercising.  Keep all follow-up visits as told by your health care provider. This is important. Contact a health care provider  if:  You feel, or think you feel, a lump in your breast.  You notice that both breasts look or feel different than usual.  Your breast is still causing pain after your menstrual period is over.  You find new lumps or bumps that were not there before.  You feel lumps in your armpit. Get help right away if:  You have severe pain, tenderness, redness, or warmth in your breast.  You have fluid or blood leaking from your nipple.  Your breast lump becomes hard and painful.  You notice dimpling or wrinkling of the breast or nipple. Summary  A breast cyst is a sac in the breast that is filled with fluid.  Treatment may not be needed for this condition.  If the cyst is uncomfortable or gets bigger, or if you do not like how the cyst makes your breast look, you may need treatment. This information is not intended to replace advice given to you by your health care provider. Make sure you discuss any questions you have with your health care provider. Document Revised: 07/01/2018 Document Reviewed: 07/01/2018 Elsevier Patient Education  2020 Elsevier Inc.  

## 2019-09-10 NOTE — Progress Notes (Signed)
Established patient visit   Patient: Shirley Rollins   DOB: Jan 20, 1981   39 y.o. Female  MRN: [id number] Visit Date: 09/10/2019  Today's healthcare provider: [name], [name]   No chief complaint on file.  Subjective    HPI  Patient here today for evaluation of new lump that she noticed 3 weeks ago. Is  located on her right breast. She is requesting a diagnostic mammogram. No known family history of breast cancer.  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   Multiple Vitamin (MULTIVITAMIN) tablet Take 1 tablet by mouth daily.   fluticasone (FLONASE SENSIMIST) 27.5 MCG/SPRAY nasal spray Place 2 sprays into the nose daily.   phenylephrine (SUDAFED PE SINUS CONGESTION) 10 MG TABS tablet Take 1 tablet (10 mg total) by mouth every 4 (four) hours as needed.   No facility-administered medications prior to visit.    Review of Systems  Constitutional: Negative.   Respiratory: Negative.   Cardiovascular: Negative.   Psychiatric/Behavioral: The patient is nervous/anxious.     Last CBC Lab Results  Component Value Date   WBC 5.6 10/13/2018   HGB 13.3 10/13/2018   HCT 39.6 10/13/2018   MCV 96 10/13/2018   MCH 32.2 10/13/2018   RDW 12.0 10/13/2018   PLT 305 10/13/2018   Last metabolic panel Lab Results  Component Value Date   GLUCOSE 93 10/13/2018   NA 139 10/13/2018    K 4.3 10/13/2018   CL 100 10/13/2018   CO2 23 10/13/2018   BUN 9 10/13/2018   CREATININE 0.69 10/13/2018   GFRNONAA 109 10/13/2018   GFRAA 126 10/13/2018   CALCIUM 10.1 10/13/2018   PROT 7.2 10/13/2018   ALBUMIN 5.0 (H) 10/13/2018   LABGLOB 2.2 10/13/2018   AGRATIO 2.3 (H) 10/13/2018   BILITOT 0.6 10/13/2018   ALKPHOS 38 (L) 10/13/2018   AST 20 10/13/2018   ALT 13 10/13/2018      Objective    BP 118/80 (BP Location: Left Arm, Patient Position: Sitting, Cuff Size: Normal)    Pulse 66    Temp (!) 97.3 F (36.3 C) (Temporal)    Resp 16    Wt 112 lb 11.2 oz (51.1 kg)    LMP 09/09/2019 (LMP Unknown)    BMI 18.47 kg/m  BP Readings from Last 3 Encounters:  09/10/19 118/80  10/13/18 108/74  08/11/18 116/68   Wt Readings from Last 3 Encounters:  09/10/19 112 lb 11.2 oz (51.1 kg)  10/13/18 113 lb 12.8 oz (51.6 kg)  08/11/18 116 lb 12.8 oz (53 kg)      Physical Exam Constitutional:      General: She is not in acute distress.    Appearance: Normal appearance. She is normal weight. She is not ill-appearing.  HENT:     Head: Normocephalic and atraumatic.  Eyes:     General: No scleral icterus. Chest:    Lymphadenopathy:     Cervical: No cervical adenopathy.     Upper Body:     Right upper body: No supraclavicular, axillary or pectoral adenopathy.     Left upper body: No supraclavicular, axillary or pectoral adenopathy.  Neurological:     Mental Status: She is alert.       No results found for any visits on 09/10/19.  Assessment & Plan     1. Breast mass, right Small, mobile, tender, well circumscribed nodule felt in the 12 o'clock position approx 3-4 cm from nipple line on the right breast. Diagnostic mammogram and [place] ordered as below. Suspect cyst by the way the mass feels, but will get imaging as below to confirm as patient is anxious about it. - MM DIAG BREAST TOMO BILATERAL; Future - US BREAST LTD UNI RIGHT INC AXILLA; Future   No follow-ups on file.       Delmer Islam, [name], have reviewed all documentation for this visit. The documentation on 09/10/19 for the exam, diagnosis, procedures, and orders are all accurate and complete.   Reine Just  [facility] [phone] (phone) [phone] (fax)  [facility]

## 2019-09-18 ENCOUNTER — Ambulatory Visit
Admission: RE | Admit: 2019-09-18 | Discharge: 2019-09-18 | Disposition: A | Discharge status: Home / Self Care | Payer: No Typology Code available for payment source | Source: Ambulatory Visit | Attending: Physician Assistant | Admitting: Physician Assistant

## 2019-09-18 DIAGNOSIS — N6310 Unspecified lump in the right breast, unspecified quadrant: Secondary | ICD-10-CM | POA: Insufficient documentation

## 2019-09-21 ENCOUNTER — Other Ambulatory Visit: Payer: Self-pay | Admitting: Physician Assistant

## 2019-09-21 DIAGNOSIS — F988 Other specified behavioral and emotional disorders with onset usually occurring in childhood and adolescence: Secondary | ICD-10-CM

## 2019-09-21 MED ORDER — AMPHETAMINE-DEXTROAMPHET ER 20 MG PO CP24: 20.0000 mg | ORAL_CAPSULE | Freq: Every day | ORAL | 0 refills | Status: DC

## 2019-09-21 NOTE — Telephone Encounter (Signed)
Requested medication (s) are due for refill today: yes  Requested medication (s) are on the active medication list: yes  Last refill:  08/20/2019  Future visit scheduled: yes  Notes to clinic:  patient ran out over weekend   Requested Prescriptions  Pending Prescriptions Disp Refills   amphetamine-dextroamphetamine (ADDERALL XR) 20 MG 24 hr capsule 30 capsule 0    Sig: Take 1 capsule (20 mg total) by mouth daily.      Not Delegated - Psychiatry:  Stimulants/ADHD Failed - 09/21/2019  8:14 AM      Failed - This refill cannot be delegated      Failed - Urine Drug Screen completed in last 360 days.      Failed - Valid encounter within last 3 months    Recent Outpatient Visits           1 week ago Breast mass, right   [facility] Rosezetta Schlatter, Alessandra Bevels, [place]   11 months ago Annual physical exam   [facility] [place], Alessandra Bevels, [place]   1 year ago Open wound of toe, initial encounter   [facility] [place], Lavella Hammock, [place]   1 year ago Dysfunction of right eustachian tube   [facility] [place], Alessandra Bevels, [name]   1 year ago ADD (attention deficit disorder) without hyperactivity   [facility], Alessandra Bevels, [name]       Future Appointments             In 4 weeks Rosezetta Schlatter, Alessandra Bevels, [name] [employer], [facility]

## 2019-09-21 NOTE — Telephone Encounter (Signed)
Medication Refill - Medication: amphetamine-dextroamphetamine (ADDERALL XR) 20 MG 24 hr capsule Pt ran out over the weekend and asked if this can be sent in today/ please advise  Has the patient contacted their pharmacy? No. (Agent: If no, request that the patient contact the pharmacy for the refill.) (Agent: If yes, when and what did the pharmacy advise?)  Preferred Pharmacy (with phone number or street name): [facility] - [place], [place] - [address]  [address] [address]  Phone:  [phone] Fax:  [phone]   Agent: Please be advised that RX refills may take up to 3 business days. We ask that you follow-up with your pharmacy.

## 2019-10-19 ENCOUNTER — Encounter: Payer: Self-pay | Admitting: Physician Assistant

## 2019-10-19 ENCOUNTER — Other Ambulatory Visit: Payer: Self-pay

## 2019-10-19 ENCOUNTER — Ambulatory Visit (INDEPENDENT_AMBULATORY_CARE_PROVIDER_SITE_OTHER): Payer: No Typology Code available for payment source | Admitting: Physician Assistant

## 2019-10-19 VITALS — BP 112/74 | HR 64 | Temp 97.6°F | Ht 66.5 in | Wt 118.0 lb

## 2019-10-19 DIAGNOSIS — F988 Other specified behavioral and emotional disorders with onset usually occurring in childhood and adolescence: Secondary | ICD-10-CM

## 2019-10-19 DIAGNOSIS — Z0000 Encounter for general adult medical examination without abnormal findings: Secondary | ICD-10-CM

## 2019-10-19 DIAGNOSIS — Z1159 Encounter for screening for other viral diseases: Secondary | ICD-10-CM

## 2019-10-19 MED ORDER — AMPHETAMINE-DEXTROAMPHET ER 25 MG PO CP24: 25.0000 mg | ORAL_CAPSULE | ORAL | 0 refills | Status: DC

## 2019-10-19 NOTE — Progress Notes (Signed)
Complete physical exam   Patient: Shirley LarssonJessica S Hesketh   DOB: 01/30/1981   39 y.o. Female  MRN: [id number] Visit Date: 10/19/2019  Today's healthcare provider: Margaretann LovelessJennifer M Viola Placeres, [name]   Chief Complaint  Patient presents with  . Annual Exam   Subjective    Shirley LarssonJessica S Martes is a 39 y.o. female who presents today for a complete physical exam.  She reports consuming a general diet. Exercising some. She generally feels well. She reports sleeping well. She does have additional problems to discuss today.  HPI  Pt would like to consider going up on her Adderall.  Not working as well as it once did. Not lasting as long.  Past Medical History:  Diagnosis Date  . Anemia   . Migraines    Past Surgical History:  Procedure Laterality Date  . etopic pregnancy  1999  . TONSILLECTOMY  in 3986's   Social History   Socioeconomic History  . Marital status: Divorced    Spouse name: Not on file  . Number of children: 1  . Years of education: Not on file  . Highest education level: Not on file  Occupational History  . Not on file  Tobacco Use  . Smoking status: Former Smoker    Types: Cigarettes    Quit date: 09/01/2018    Years since quitting: 1.1  . Smokeless tobacco: Never Used  . Tobacco comment: 3/4 of a pack per day  Vaping Use  . Vaping Use: Never used  Substance and Sexual Activity  . Alcohol use: Yes    Comment: occassional  . Drug use: No  . Sexual activity: Not on file  Other Topics Concern  . Not on file  Social History Narrative  . Not on file   Social Determinants of Health   Financial Resource Strain:   . Difficulty of Paying Living Expenses: Not on file  Food Insecurity:   . Worried About Programme researcher, broadcasting/film/videounning Out of Food in the Last Year: Not on file  . Ran Out of Food in the Last Year: Not on file  Transportation Needs:   . Lack of Transportation (Medical): Not on file  . Lack of Transportation (Non-Medical): Not on file  Physical Activity:   . Days of Exercise per  Week: Not on file  . Minutes of Exercise per Session: Not on file  Stress:   . Feeling of Stress : Not on file  Social Connections:   . Frequency of Communication with Friends and Family: Not on file  . Frequency of Social Gatherings with Friends and Family: Not on file  . Attends Religious Services: Not on file  . Active Member of Clubs or Organizations: Not on file  . Attends BankerClub or Organization Meetings: Not on file  . Marital Status: Not on file  Intimate Partner Violence:   . Fear of Current or Ex-Partner: Not on file  . Emotionally Abused: Not on file  . Physically Abused: Not on file  . Sexually Abused: Not on file   Family Status  Relation Name Status  . Mother  Deceased at age 41  . Father  Deceased       cause of death: AIDS  . Sister 1/2 sister Alive       substance abuse; Bipolar Disorder  . MGM  Deceased       Liver cancer  . MGF  Deceased  . PGM  Deceased  . PGF  Deceased   Family History  Problem Relation  Age of Onset  . Diabetes Mother   . Pancreatic cancer Mother 32  . Cancer Mother        pancreatic  . Heart attack Maternal Grandfather   . Heart disease Maternal Grandfather    Allergies  Allergen Reactions  . Penicillin G Anaphylaxis  . Sulfa Antibiotics Other (See Comments)    Patient Care Team: Reine Just as PCP - General (Physician Assistant)   Medications: Outpatient Medications Prior to Visit  Medication Sig  . amphetamine-dextroamphetamine (ADDERALL XR) 20 MG 24 hr capsule Take 1 capsule (20 mg total) by mouth daily.  . Multiple Vitamin (MULTIVITAMIN) tablet Take 1 tablet by mouth daily.   No facility-administered medications prior to visit.    Review of Systems  Constitutional: Positive for activity change. Negative for appetite change, chills, diaphoresis, fatigue, fever and unexpected weight change.  HENT: Negative.   Eyes: Negative.   Respiratory: Negative.   Cardiovascular: Negative.   Gastrointestinal:  Negative.   Endocrine: Negative.   Genitourinary: Negative.   Musculoskeletal: Negative.   Allergic/Immunologic: Negative.   Neurological: Negative.   Hematological: Negative.   Psychiatric/Behavioral: Positive for decreased concentration. Negative for agitation, behavioral problems, confusion, dysphoric mood, hallucinations, self-injury, sleep disturbance and suicidal ideas. The patient is nervous/anxious. The patient is not hyperactive.     Last CBC Lab Results  Component Value Date   WBC 5.6 10/13/2018   HGB 13.3 10/13/2018   HCT 39.6 10/13/2018   MCV 96 10/13/2018   MCH 32.2 10/13/2018   RDW 12.0 10/13/2018   PLT 305 10/13/2018   Last metabolic panel Lab Results  Component Value Date   GLUCOSE 93 10/13/2018   NA 139 10/13/2018   K 4.3 10/13/2018   CL 100 10/13/2018   CO2 23 10/13/2018   BUN 9 10/13/2018   CREATININE 0.69 10/13/2018   GFRNONAA 109 10/13/2018   GFRAA 126 10/13/2018   CALCIUM 10.1 10/13/2018   PROT 7.2 10/13/2018   ALBUMIN 5.0 (H) 10/13/2018   LABGLOB 2.2 10/13/2018   AGRATIO 2.3 (H) 10/13/2018   BILITOT 0.6 10/13/2018   ALKPHOS 38 (L) 10/13/2018   AST 20 10/13/2018   ALT 13 10/13/2018   Last lipids Lab Results  Component Value Date   CHOL 187 10/13/2018   HDL 54 10/13/2018   LDLCALC 106 (H) 10/13/2018   TRIG 134 10/13/2018   CHOLHDL 3.5 10/13/2018   Last hemoglobin A1c Lab Results  Component Value Date   HGBA1C 5.1 10/13/2018   Last thyroid functions Lab Results  Component Value Date   TSH 1.650 10/13/2018   Last vitamin D No results found for: 25OHVITD2, 25OHVITD3, VD25OH Last vitamin B12 and Folate No results found for: VITAMINB12, FOLATE  Objective    BP 112/74 (BP Location: Left Arm, Patient Position: Sitting, Cuff Size: Large)   Pulse 64   Temp 97.6 F (36.4 C) (Oral)   Ht 5' 6.5" (1.689 m)   Wt 118 lb (53.5 kg)   BMI 18.76 kg/m    Physical Exam Vitals reviewed.  Constitutional:      General: She is not in acute  distress.    Appearance: Normal appearance. She is well-developed, well-groomed and normal weight. She is not ill-appearing or diaphoretic.  HENT:     Head: Normocephalic and atraumatic.     Right Ear: Tympanic membrane, ear canal and external ear normal.     Left Ear: Tympanic membrane, ear canal and external ear normal.     Nose: Nose normal.  Mouth/Throat:     Mouth: Mucous membranes are moist.     Pharynx: Oropharynx is clear. No oropharyngeal exudate.  Eyes:     General: No scleral icterus.       Right eye: No discharge.        Left eye: No discharge.     Extraocular Movements: Extraocular movements intact.     Conjunctiva/sclera: Conjunctivae normal.     Pupils: Pupils are equal, round, and reactive to light.  Neck:     Thyroid: No thyromegaly.     Vascular: No JVD.     Trachea: No tracheal deviation.  Cardiovascular:     Rate and Rhythm: Normal rate and regular rhythm.     Pulses: Normal pulses.     Heart sounds: Normal heart sounds. No murmur heard.  No friction rub. No gallop.   Pulmonary:     Effort: Pulmonary effort is normal. No respiratory distress.     Breath sounds: Normal breath sounds. No wheezing or rales.  Chest:     Chest wall: No tenderness.  Abdominal:     General: Abdomen is flat. Bowel sounds are normal. There is no distension.     Palpations: Abdomen is soft. There is no mass.     Tenderness: There is no abdominal tenderness. There is no guarding or rebound.  Musculoskeletal:        General: No tenderness. Normal range of motion.     Cervical back: Normal range of motion and neck supple.     Right lower leg: No edema.     Left lower leg: No edema.  Lymphadenopathy:     Cervical: No cervical adenopathy.  Skin:    General: Skin is warm and dry.     Capillary Refill: Capillary refill takes less than 2 seconds.     Findings: No rash.  Neurological:     General: No focal deficit present.     Mental Status: She is alert and oriented to person,  place, and time. Mental status is at baseline.  Psychiatric:        Mood and Affect: Mood normal.        Behavior: Behavior normal. Behavior is cooperative.        Thought Content: Thought content normal.        Judgment: Judgment normal.     Last depression screening scores PHQ 2/9 Scores 10/19/2019 10/13/2018 09/13/2017  PHQ - 2 Score 2 1 1   PHQ- 9 Score 7 8 2    Last fall risk screening Fall Risk  10/19/2019  Falls in the past year? 0  Number falls in past yr: 0  Injury with Fall? 0  Follow up Falls evaluation completed   Last Audit-C alcohol use screening Alcohol Use Disorder Test (AUDIT) 10/19/2019  1. How often do you have a drink containing alcohol? 3  2. How many drinks containing alcohol do you have on a typical day when you are drinking? 0  3. How often do you have six or more drinks on one occasion? 1  AUDIT-C Score 4  Alcohol Brief Interventions/Follow-up -   A score of 3 or more in women, and 4 or more in men indicates increased risk for alcohol abuse, EXCEPT if all of the points are from question 1   No results found for any visits on 10/19/19.  Assessment & Plan    Routine Health Maintenance and Physical Exam  Exercise Activities and Dietary recommendations Goals   None     Immunization  History  Administered Date(s) Administered  . Influenza,inj,Quad PF,6+ Mos 11/26/2014    Health Maintenance  Topic Date Due  . Hepatitis C Screening  Never done  . COVID-19 Vaccine (1) Never done  . INFLUENZA VACCINE  09/27/2019  . PAP SMEAR-Modifier  09/25/2020  . TETANUS/TDAP  08/26/2021  . HIV Screening  Completed    Discussed health benefits of physical activity, and encouraged her to engage in regular exercise appropriate for her age and condition.  1. Annual physical exam Normal physical exam today. Will check labs as below and f/u pending lab results. If labs are stable and WNL she will not need to have these rechecked for one year at her next annual physical  exam. She is to call the office in the meantime if she has any acute issue, questions or concerns. - CBC w/Diff/Platelet - Comprehensive Metabolic Panel (CMET) - Lipid Panel With LDL/HDL Ratio - TSH - HgB X2J - Hepatitis C Antibody  2. ADD (attention deficit disorder) without hyperactivity Not as effective. Changing to adderall XR 25mg  from 20mg . F/U in 4 weeks if dose needs to be adjusted.  - amphetamine-dextroamphetamine (ADDERALL XR) 25 MG 24 hr capsule; Take 1 capsule by mouth every morning.  Dispense: 90 capsule; Refill: 0  3. Encounter for hepatitis C screening test for low risk patient Will check labs as below and f/u pending results. - Hepatitis C Antibody   No follow-ups on file.         [facility] [phone] (phone) [phone] (fax)  [facility]

## 2019-10-19 NOTE — Patient Instructions (Signed)
Health Maintenance, Female Adopting a healthy lifestyle and getting preventive care are important in promoting health and wellness. Ask your health care provider about:  The right schedule for you to have regular tests and exams.  Things you can do on your own to prevent diseases and keep yourself healthy. What should I know about diet, weight, and exercise? Eat a healthy diet   Eat a diet that includes plenty of vegetables, fruits, low-fat dairy products, and lean protein.  Do not eat a lot of foods that are high in solid fats, added sugars, or sodium. Maintain a healthy weight Body mass index (BMI) is used to identify weight problems. It estimates body fat based on height and weight. Your health care provider can help determine your BMI and help you achieve or maintain a healthy weight. Get regular exercise Get regular exercise. This is one of the most important things you can do for your health. Most adults should:  Exercise for at least 150 minutes each week. The exercise should increase your heart rate and make you sweat (moderate-intensity exercise).  Do strengthening exercises at least twice a week. This is in addition to the moderate-intensity exercise.  Spend less time sitting. Even light physical activity can be beneficial. Watch cholesterol and blood lipids Have your blood tested for lipids and cholesterol at 41 years of age, then have this test every 5 years. Have your cholesterol levels checked more often if:  Your lipid or cholesterol levels are high.  You are older than 40 years of age.  You are at high risk for heart disease. What should I know about cancer screening? Depending on your health history and family history, you may need to have cancer screening at various ages. This may include screening for:  Breast cancer.  Cervical cancer.  Colorectal cancer.  Skin cancer.  Lung cancer. What should I know about heart disease, diabetes, and high blood  pressure? Blood pressure and heart disease  High blood pressure causes heart disease and increases the risk of stroke. This is more likely to develop in people who have high blood pressure readings, are of African descent, or are overweight.  Have your blood pressure checked: ? Every 3-5 years if you are 18-39 years of age. ? Every year if you are 40 years old or older. Diabetes Have regular diabetes screenings. This checks your fasting blood sugar level. Have the screening done:  Once every three years after age 40 if you are at a normal weight and have a low risk for diabetes.  More often and at a younger age if you are overweight or have a high risk for diabetes. What should I know about preventing infection? Hepatitis B If you have a higher risk for hepatitis B, you should be screened for this virus. Talk with your health care provider to find out if you are at risk for hepatitis B infection. Hepatitis C Testing is recommended for:  Everyone born from 1945 through 1965.  Anyone with known risk factors for hepatitis C. Sexually transmitted infections (STIs)  Get screened for STIs, including gonorrhea and chlamydia, if: ? You are sexually active and are younger than 41 years of age. ? You are older than 41 years of age and your health care provider tells you that you are at risk for this type of infection. ? Your sexual activity has changed since you were last screened, and you are at increased risk for chlamydia or gonorrhea. Ask your health care provider if   you are at risk.  Ask your health care provider about whether you are at high risk for HIV. Your health care provider may recommend a prescription medicine to help prevent HIV infection. If you choose to take medicine to prevent HIV, you should first get tested for HIV. You should then be tested every 3 months for as long as you are taking the medicine. Pregnancy  If you are about to stop having your period (premenopausal) and  you may become pregnant, seek counseling before you get pregnant.  Take 400 to 800 micrograms (mcg) of folic acid every day if you become pregnant.  Ask for birth control (contraception) if you want to prevent pregnancy. Osteoporosis and menopause Osteoporosis is a disease in which the bones lose minerals and strength with aging. This can result in bone fractures. If you are 65 years old or older, or if you are at risk for osteoporosis and fractures, ask your health care provider if you should:  Be screened for bone loss.  Take a calcium or vitamin D supplement to lower your risk of fractures.  Be given hormone replacement therapy (HRT) to treat symptoms of menopause. Follow these instructions at home: Lifestyle  Do not use any products that contain nicotine or tobacco, such as cigarettes, e-cigarettes, and chewing tobacco. If you need help quitting, ask your health care provider.  Do not use street drugs.  Do not share needles.  Ask your health care provider for help if you need support or information about quitting drugs. Alcohol use  Do not drink alcohol if: ? Your health care provider tells you not to drink. ? You are pregnant, may be pregnant, or are planning to become pregnant.  If you drink alcohol: ? Limit how much you use to 0-1 drink a day. ? Limit intake if you are breastfeeding.  Be aware of how much alcohol is in your drink. In the [place] one drink equals one 12 oz bottle of beer (355 mL), one 5 oz glass of wine (148 mL), or one 1 oz glass of hard liquor (44 mL). General instructions  Schedule regular health, dental, and eye exams.  Stay current with your vaccines.  Tell your health care provider if: ? You often feel depressed. ? You have ever been abused or do not feel safe at home. Summary  Adopting a healthy lifestyle and getting preventive care are important in promoting health and wellness.  Follow your health care provider's instructions about healthy  diet, exercising, and getting tested or screened for diseases.  Follow your health care provider's instructions on monitoring your cholesterol and blood pressure. This information is not intended to replace advice given to you by your health care provider. Make sure you discuss any questions you have with your health care provider. Document Revised: 02/05/2018 Document Reviewed: 02/05/2018 Elsevier Patient Education  2020 Elsevier Inc.  

## 2019-10-20 ENCOUNTER — Telehealth: Payer: Self-pay

## 2019-10-20 LAB — CBC WITH DIFFERENTIAL/PLATELET
Basophils Absolute: 0.1 10*3/uL (ref 0.0–0.2)
Basos: 1 %
EOS (ABSOLUTE): 0.1 10*3/uL (ref 0.0–0.4)
Eos: 2 %
Hematocrit: 41.9 % (ref 34.0–46.6)
Hemoglobin: 14.1 g/dL (ref 11.1–15.9)
Immature Grans (Abs): 0 10*3/uL (ref 0.0–0.1)
Immature Granulocytes: 0 %
Lymphocytes Absolute: 1.9 10*3/uL (ref 0.7–3.1)
Lymphs: 35 %
MCH: 32.3 pg (ref 26.6–33.0)
MCHC: 33.7 g/dL (ref 31.5–35.7)
MCV: 96 fL (ref 79–97)
Monocytes Absolute: 0.6 10*3/uL (ref 0.1–0.9)
Monocytes: 11 %
Neutrophils Absolute: 2.8 10*3/uL (ref 1.4–7.0)
Neutrophils: 51 %
Platelets: 286 10*3/uL (ref 150–450)
RBC: 4.36 x10E6/uL (ref 3.77–5.28)
RDW: 11.5 % — ABNORMAL LOW (ref 11.7–15.4)
WBC: 5.6 10*3/uL (ref 3.4–10.8)

## 2019-10-20 LAB — COMPREHENSIVE METABOLIC PANEL
ALT: 12 IU/L (ref 0–32)
AST: 19 IU/L (ref 0–40)
Albumin/Globulin Ratio: 1.9 (ref 1.2–2.2)
Albumin: 5 g/dL — ABNORMAL HIGH (ref 3.8–4.8)
Alkaline Phosphatase: 40 IU/L — ABNORMAL LOW (ref 48–121)
BUN/Creatinine Ratio: 15 (ref 9–23)
BUN: 10 mg/dL (ref 6–24)
Bilirubin Total: 0.4 mg/dL (ref 0.0–1.2)
CO2: 22 mmol/L (ref 20–29)
Calcium: 10.2 mg/dL (ref 8.7–10.2)
Chloride: 101 mmol/L (ref 96–106)
Creatinine, Ser: 0.68 mg/dL (ref 0.57–1.00)
GFR calc Af Amer: 126 mL/min/{1.73_m2} (ref >59)
GFR calc non Af Amer: 109 mL/min/{1.73_m2} (ref >59)
Globulin, Total: 2.6 g/dL (ref 1.5–4.5)
Glucose: 88 mg/dL (ref 65–99)
Potassium: 4.3 mmol/L (ref 3.5–5.2)
Sodium: 136 mmol/L (ref 134–144)
Total Protein: 7.6 g/dL (ref 6.0–8.5)

## 2019-10-20 LAB — LIPID PANEL WITH LDL/HDL RATIO
Cholesterol, Total: 196 mg/dL (ref 100–199)
HDL: 51 mg/dL (ref >39)
LDL Chol Calc (NIH): 116 mg/dL — ABNORMAL HIGH (ref 0–99)
LDL/HDL Ratio: 2.3 ratio (ref 0.0–3.2)
Triglycerides: 168 mg/dL — ABNORMAL HIGH (ref 0–149)
VLDL Cholesterol Cal: 29 mg/dL (ref 5–40)

## 2019-10-20 LAB — HEMOGLOBIN A1C
Est. average glucose Bld gHb Est-mCnc: 103 mg/dL
Hgb A1c MFr Bld: 5.2 % (ref 4.8–5.6)

## 2019-10-20 LAB — TSH: TSH: 2.21 u[IU]/mL (ref 0.450–4.500)

## 2019-10-20 LAB — HEPATITIS C ANTIBODY: Hep C Virus Ab: 0.2 s/co ratio (ref 0.0–0.9)

## 2019-10-20 NOTE — Telephone Encounter (Signed)
-----   Message from [name], [place] sent at 10/20/2019  9:44 AM EDT ----- Blood count is normal. Kidney and liver function are normal. Sodium, potassium, and calcium are normal. Cholesterol is ok. LDL is borderline elevated. Thyroid is normal. A1c/sugar is normal. Hep C screen is negative.

## 2019-10-20 NOTE — Telephone Encounter (Signed)
Patient advised as directed below. 

## 2019-10-21 ENCOUNTER — Encounter: Payer: Self-pay | Admitting: Physician Assistant

## 2020-01-18 ENCOUNTER — Other Ambulatory Visit: Payer: Self-pay | Admitting: Physician Assistant

## 2020-01-18 DIAGNOSIS — F988 Other specified behavioral and emotional disorders with onset usually occurring in childhood and adolescence: Secondary | ICD-10-CM

## 2020-01-18 NOTE — Telephone Encounter (Signed)
Copied from CRM [id number]. Topic: Quick Communication - Rx Refill/Question >> Jan 18, 2020  4:05 PM Mcneil, Ja-Kwan wrote: Medication: amphetamine-dextroamphetamine (ADDERALL XR) 25 MG 24 hr capsule  Has the patient contacted their pharmacy? no  Preferred Pharmacy (with phone number or street name): [facility] - [place], [place] - [address]  Phone: [phone]   Fax: [phone]  Agent: Please be advised that RX refills may take up to 3 business days. We ask that you follow-up with your pharmacy.

## 2020-01-18 NOTE — Telephone Encounter (Signed)
Please review for refill. Refill not delegated. Last refill: 10/19/19 #90 Last OV: 10/19/19

## 2020-01-19 ENCOUNTER — Other Ambulatory Visit: Payer: Self-pay | Admitting: Physician Assistant

## 2020-01-19 MED ORDER — AMPHETAMINE-DEXTROAMPHET ER 25 MG PO CP24: 25.0000 mg | ORAL_CAPSULE | ORAL | 0 refills | Status: DC

## 2020-04-18 ENCOUNTER — Encounter: Payer: Self-pay | Admitting: Physician Assistant

## 2020-04-18 ENCOUNTER — Ambulatory Visit (INDEPENDENT_AMBULATORY_CARE_PROVIDER_SITE_OTHER): Payer: No Typology Code available for payment source | Admitting: Physician Assistant

## 2020-04-18 ENCOUNTER — Other Ambulatory Visit: Payer: Self-pay

## 2020-04-18 ENCOUNTER — Other Ambulatory Visit: Payer: Self-pay | Admitting: Physician Assistant

## 2020-04-18 VITALS — BP 106/71 | HR 78 | Temp 98.4°F | Wt 117.0 lb

## 2020-04-18 DIAGNOSIS — S00451A Superficial foreign body of right ear, initial encounter: Secondary | ICD-10-CM

## 2020-04-18 DIAGNOSIS — F988 Other specified behavioral and emotional disorders with onset usually occurring in childhood and adolescence: Secondary | ICD-10-CM

## 2020-04-18 MED ORDER — AMPHETAMINE-DEXTROAMPHET ER 25 MG PO CP24: 25.0000 mg | ORAL_CAPSULE | ORAL | 0 refills | Status: DC

## 2020-04-18 NOTE — Progress Notes (Signed)
Established patient visit   Patient: Shirley Rollins   DOB: [birth year]   40 y.o. Female  MRN: [id number] Visit Date: 04/18/2020  Today's healthcare provider: [name], [name]   Chief Complaint  Patient presents with  . Ear Pain    Right ear.  Ear piercing infected.   . ADHD   Subjective    HPI HPI    Ear Pain     Additional comments: Right ear.  Ear piercing infected.        Last edited by Paschal Dopp, CMA on 04/18/2020  8:38 AM. (History)      Follow up for ADHD  The patient was last seen for this 6 months ago. Changes made at last visit include changing to Adderall XR 25mg  daily.  She reports excellent compliance with treatment. She feels that condition is Improved. She is not having side effects.   -----------------------------------------------------------------------------------------    Patient Active Problem List   Diagnosis Date Noted  . Family history of pancreatic cancer 09/13/2017  . 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 Current tobacco use 11/26/2014  . Abnormal cells of cervix 11/18/2013   Past Medical History:  Diagnosis Date  . Anemia   . Migraines    Social History   Tobacco Use  . Smoking status: Former Smoker    Types: Cigarettes    Quit date: 09/01/2018    Years since quitting: 1.6  . Smokeless tobacco: Never Used  . Tobacco comment: 3/4 of a pack per day  Vaping Use  . Vaping Use: Never used  Substance Use Topics  . Alcohol use: Yes    Comment: occassional  . Drug use: No   Allergies  Allergen Reactions  . Penicillin G Anaphylaxis  . Sulfa Antibiotics Other (See Comments)     Medications: Outpatient Medications Prior to Visit  Medication Sig  . Multiple Vitamin (MULTIVITAMIN) tablet Take 1 tablet by mouth daily.  . [DISCONTINUED] amphetamine-dextroamphetamine (ADDERALL XR) 25 MG 24 hr capsule Take 1 capsule by mouth every morning.   No facility-administered medications  prior to visit.    Review of Systems  Constitutional: Negative.   Respiratory: Negative.   Cardiovascular: Negative.   Gastrointestinal: Negative.   Neurological: Negative for dizziness, light-headedness and headaches.  Psychiatric/Behavioral: Positive for decreased concentration (Occasionally, but has improved. ) and sleep disturbance. Negative for dysphoric mood, self-injury and suicidal ideas. The patient is not nervous/anxious.         Objective    BP 106/71 (BP Location: Right Arm, Patient Position: Sitting, Cuff Size: Normal)   Pulse 78   Temp 98.4 F (36.9 C) (Oral)   Wt 117 lb (53.1 kg)   BMI 18.60 kg/m    Physical Exam Vitals reviewed.  Constitutional:      General: She is not in acute distress.    Appearance: Normal appearance. She is well-developed, normal weight and well-nourished. She is not ill-appearing or diaphoretic.  HENT:     Head:     Comments: Earring stuck in helix piercing of the right ear Cardiovascular:     Rate and Rhythm: Normal rate and regular rhythm.     Heart sounds: Normal heart sounds. No murmur heard. No friction rub. No gallop.   Pulmonary:     Effort: Pulmonary effort is normal. No respiratory distress.     Breath sounds: Normal breath sounds. No wheezing or rales.  Musculoskeletal:     Cervical back: Normal range of motion and  neck supple.  Neurological:     Mental Status: She is alert.      No results found for any visits on 04/18/20.  Assessment & Plan     1. 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 Diagnosis pulled for medication refill. Continue current medical treatment plan. - amphetamine-dextroamphetamine (ADDERALL XR) 25 MG 24 hr capsule; Take 1 capsule by mouth every morning.  Dispense: 90 capsule; Refill: 0  2. Foreign body of right ear lobe, initial encounter Earring stuck in helix piercing of right ear. Present since she was 15 and now causing pain and draining. Earring removed using  hemostat without complications. Advised to cleanse piercing with soap and water and rinse with neilmed wound wash. Call if purulent drainage, pain or redness does not improve or worsens.    Return in about 6 months (around 10/16/2020) for CPE.      Delmer Islam, [name], have reviewed all documentation for this visit. The documentation on 04/18/20 for the exam, diagnosis, procedures, and orders are all accurate and complete.   Reine Just  [facility] [phone] (phone) [phone] (fax)  [facility]

## 2020-07-08 ENCOUNTER — Other Ambulatory Visit: Payer: Self-pay

## 2020-07-08 ENCOUNTER — Encounter: Payer: Self-pay | Admitting: Physician Assistant

## 2020-07-08 ENCOUNTER — Telehealth: Payer: No Typology Code available for payment source | Admitting: Physician Assistant

## 2020-07-08 ENCOUNTER — Telehealth: Payer: Self-pay

## 2020-07-08 DIAGNOSIS — B09 Unspecified viral infection characterized by skin and mucous membrane lesions: Secondary | ICD-10-CM | POA: Diagnosis not present

## 2020-07-08 MED ORDER — TRIAMCINOLONE ACETONIDE 0.1 % EX CREA
1.0000 | TOPICAL_CREAM | Freq: Two times a day (BID) | CUTANEOUS | 0 refills | Status: DC
Start: 2020-07-08 — End: 2020-10-11
  Filled 2020-07-08: qty 30, 15d supply, fill #0

## 2020-07-08 NOTE — Telephone Encounter (Signed)
Copied from CRM [id number]. Topic: General - Other >> Jul 08, 2020  9:50 AM Shirley Rollins A wrote: Reason for CRM: Patient would like to know who will now be their PCP at the practice  Please contact to further advise when possible

## 2020-07-08 NOTE — Patient Instructions (Signed)
07/08/20, 10:30 AM Viral Exanthems Rashes - Conditions and Treatments  [email or web address] viral exanthem is an,physician or other h. 1/10 Viral Exanthems (Rashes) What are viral exanthems? A viral exanthem is an eruptive skin rash that is often related to a viral infection. Immunizations have decreased the number of cases of measles, mumps, rubella, and chickenpox, but all viral skin infections require clinical care by a physician or other healthcare professional. physician or other healthcare professional. The most common childhood viral exanthems include the following: Measles (rubeola) Rubella ([place] measles) (/visit/conditions-and-treatments/skin-disorders/viral-exanthemsrashes/rubella) Varicella (or chickenpox) Fifth disease Roseola Immunizations have decreased the number of cases of measles, mumps, rubella, and chickenpox, but all viral skin infections require clinical care by a physician or other healthcare professional. Each of the viral exanthems listed here have a distinct pattern, which can aid in the diagnosis. Measles (or Rubeola) What is measles (rubeola)? Rubeola, also called 10-day measles, red measles, or measles, is a very contagious viral illness that results in a distinct rash. It is spread from one child to another through direct contact with discharge from the nose and throat, or via airborne droplets from an infected child. Measles is a highly contagious disease that usually consists of a rash, fever, and cough. What causes measles? The measles virus, which causes the disease, is classified as a Morbillivirus. It is mostly seen in the winter and spring, but measles is preventable with proper immunization. The measles vaccine is usually given in combination with the mumps and rubella vaccine. It is called the MMR and is usually given when the child is 32 to 50  months old and then again between 37 and 55 years of age. 07/08/20, 10:30 AM Viral Exanthems Rashes - Conditions and Treatments  [email or web address] viral exanthem is an,physician or other h. 2/10 What are the symptoms of measles? After exposure to the disease, it can take between 8 to 12 days for a child to develop symptoms of rubeola. Children are contagious 1 to 2 days before the onset of symptoms and 3 to 5 days after the rash develops. This means that children can be contagious before they even know they have measles. During the early stages of measles (which lasts between 1 to 4 days), symptoms usually resemble those of an upper respiratory infection. Each child may experience symptoms differently, however. Common symptoms may include: Hacking cough Redness and irritation of the eyes Fever Small red spots with white centers on the inside of the cheek (these usually occur two days before the rash on the skin appears) A deep, red, flat rash that starts on the face and spreads down to the trunk, arms, and legs. The rash starts as small distinct lesions, which then combines to form one big rash. After 3 to 4 days, the rash will begin to clear, leaving a brownish discoloration and skin peeling The most serious complications from measles include the following: Ear infections Pneumonia Croup Inflammation of the brain The symptoms of rubeola may resemble other skin conditions or medical problems. Always consult a physician for a diagnosis. How is rubeola diagnosed? Measles is usually diagnosed based on a complete medical history and physical examination of the child. The lesions of rubeola are unique, and usually allow for a diagnosis simply on physical examination. In addition, a physician may order blood or urine tests to confirm the diagnosis. The most serious complications from  measles include the following: Ear infections Pneumonia Croup Inflammation of the brain 07/08/20, 10:30 AM Viral Exanthems Rashes -  Conditions and Treatments  [email or web address] viral exanthem is an,physician or other h. 3/10 The symptoms of rubeola may resemble other skin conditions or medical problems. Always consult a physician for a diagnosis. What is the treatment for rubeola? Specific treatment for measles will be determined based on: The child's age, overall health, and medical history Extent of the disease The child's tolerance for specific medications, procedures or therapies Expectations for the course of the disease Child or parent's opinion or preference Aspirin and the Risk of Reye Syndrome in Children Do not give aspirin to a child without first contacting the child's physician. Aspirin has been associated with Reye syndrome, a potentially serious or deadly disorder in children. Pediatricians and other health care providers recommend that aspirin (or any medication that contains aspirin) not be used to treat any viral illnesses in children. Because measles is caused by a virus, there is no cure for rubeola. The goal of treatment is to help prevent the disease, or decrease the severity of the symptoms. Increased fluid intake Acetaminophen for fever (DO NOT GIVE ASPIRIN) Prevention of Rubeola Since the widespread use of the rubeola (measles) vaccine, the incidence of measles has decreased by 99 percent. About 5 percent of measles are due to vaccine failure. If the child was exposed and has not been immunized, the physician can give the vaccine to the child within 72 hours to help prevent the disease. Other ways to prevent the spread of rubeola include: Children should not attend school or daycare for 4 days after the rash appears Confirm that the child's contacts  have been properly immunized Roseola What is roseola? 07/08/20, 10:30 AM Viral Exanthems Rashes - Conditions and Treatments  [email or web address] viral exanthem is an,physician or other h. 4/10 Roseola is a contagious viral illness that is marked by a high fever and a rash that develops as the fever decreases. What causes roseola? Roseola is likely caused by more than one virus, but the most common cause is the human herpesvirus 6 (HHV-6). Roseola occurs mostly in children under the age of 60, and occurs more often in the spring and fall. What are the symptoms of roseola? It can take between 5 and 15 days for a child to develop symptoms of roseola after being exposed to the disease. Children are most contagious during the period of high fever, before the rash occurs. The following are the most common symptoms of roseola. However, each child may experience symptoms differently. High fever that starts abruptly Fever (may last three to four days) Irritability Swelling of the eyelids Rash (as the fever decreases, a pink rash, with either flat or raised lesions, starts to appear on the trunk and then spreads to the face, arms, and legs.) What are the complications of roseola? The most serious complication of roseola is febrile seizures. As the child's temperature rises, there is a chance that the child will have a seizure. The symptoms of roseola may resemble other skin conditions or medical problems. Always consult a physician for the proper diagnosis. How is roseola diagnosed? Roseola is usually diagnosed based on a medical history and physical examination of your child. The rash of roseola that follows a high fever is unique, and often the diagnosis is made simply on physical examination. Aspirin and the Risk of Reye Syndrome in Children Do not give aspirin to a child without first  contacting the child's physician. Aspirin has been associated with Reye syndrome, a potentially serious or deadly disorder in children. Pediatricians  and other healthcare providers recommend that aspirin (or any medication that contains aspirin) not be used to treat any viral illnesses in children. What is the treatment for roseola? 07/08/20, 10:30 AM Viral Exanthems Rashes - Conditions and Treatments  [email or web address] viral exanthem is an,physician or other h. 5/10 The specific treatment for roseola will be determined based on: The child's age, overall health, and medical history Extent of the disease The child's tolerance for specific medications, procedures, or therapies Expectations for the course of the disease Child or parent's opinion or preference Since it is a viral infection, there is no cure for roseola. The goal of treatment for roseola is to help decrease the severity of the symptoms. Treatment may include increased fluid intake or acetaminophen for fever (DO NOT GIVE ASPIRIN). Fifth Disease What is fifth disease? Fifth disease is a moderately contagious viral illness that causes a rash on the skin, but no fever, as with other viral illnesses. It is spread from one child to another through direct contact with discharge from the nose and throat and can also be spread through contact with infected blood. What causes fifth disease? Fifth disease is caused by the human parvovirus. It is most prevalent in the winter and spring and is usually seen in children ages 68 to 2. Outbreaks of the disease frequently occur in school settings. What are the symptoms of fifth disease? It can take between 4 to 14 days for a child to develop symptoms of fifth disease after being exposed to the disease. Children are most contagious before the rash occurs, meaning they are often contagious  before they even know they have the disease. In addition, about 20 percent of people with fifth disease do not have symptoms but can still spread the disease. The following are the most common symptoms of fifth disease. However, each child may experience symptoms differently. There may be an early phase with the following symptoms, although this is not very common. If this happens, symptoms may include the following: Fever Headache Red eyes Sore throat 07/08/20, 10:30 AM Viral Exanthems Rashes - Conditions and Treatments  [email or web address] viral exanthem is an,physician or other h. 6/10 The rash is usually the primary symptom of fifth disease. The rash: Starts on the cheeks and is bright red. The rash looks like slapped cheeks. Spreads to the trunk, arms, and legs, and lasts two to four days. May continue to reappear if the child is exposed to sunlight, very hot or cold temperature, or trauma to the skin. This may continue for several days. Warning Pregnant women who have been exposed to fifth disease need to seek immediate medical attention. Fifth disease is usually a mild illness. However, parvovirus B19 infection can cause an acute severe anemia in persons with sickle-cell disease or immune deficiencies. Also, there is a small risk of fetal death if fifth disease is acquired during pregnancy. The symptoms of fifth disease may resemble other conditions or medical problems, so consult a physician for proper diagnosis. How is fifth disease diagnosed? Fifth disease is usually diagnosed based on a complete medical history and physical examination of the child. The rash and progression of fifth disease is unique, and usually allows for a diagnosis simply on physical examination. The physician may also order blood tests to aid in the diagnosis. Aspirin and the Risk of Reye Syndrome  in Children Do not give aspirin to a child without first contacting the child's physician. Aspirin has been associated with Reye syndrome, a  potentiallyserious or deadly disorder in children. Pediatricians and otherhealthcare providers recommend that aspirin (or any medication thatcontains aspirin) not be used to treat any viral illnesses in children. What is the treatment for fifth disease? The specific treatment for fifth disease will be determined based on: The child's age, overall health, and medical history Extent of the disease The child's tolerance for specific medications, procedures, or therapies Expectations for the course of the disease Child or parent's opinion or preference 07/08/20, 10:30 AM Viral Exanthems Rashes - Conditions and Treatments  [email or web address] viral exanthem is an,physician or other h. 7/10 Since it is a viral infection, there is no cure for fifth disease. The goal of treatment for fifth disease is to help decrease the severity of the symptoms. Treatment may include increased fluid intake or acetaminophen for fever (DO NOT GIVE ASPIRIN). Chickenpox What is chickenpox? Chickenpox is a highly infectious disease, usually associated with childhood. By adulthood, more than 95 percent of Americans have had chickenpox. The disease is caused by the varicella-zoster virus (VZV), a form of the herpes virus. Transmission occurs from personto-person by direct contact or through the air. Chickenpox most commonly occurs in children between the ages of 58 and 88. In the [place], in areas with a large number of children in daycare settings, chicken pox in children between the ages of 41 and 4 is common. What is the chickenpox vaccine? Since 1995, a chickenpox vaccine has been available for children 74 months of age and older. Adolescents and adults who have never had  chickenpox can also get the vaccine. The vaccine has proven very effective in preventing severe chickenpox. The [facility] [place] on [employer], the [employer], and the [employer] recommend that all children be vaccinated with the chickenpox vaccine between 22 and 25 months of age. A booster vaccination is recommended again between 73 and 69 years of age. Many schools now require vaccination prior to entry into preschool or public schools. What are the symptoms of chickenpox? Symptoms are usually mild among children, but may be life threatening to infants, adults, and people with impaired immune systems. The following are the most common symptoms of chickenpox. However, each child may experience symptoms differently. Symptoms may include: Fatigue and irritability one to two days before the rash begins Itchy rash on the trunk, face, under the armpits, on the upper arms and legs, and inside the mouth Fever Feeling ill 07/08/20, 10:30 AM Viral Exanthems Rashes - Conditions and Treatments  [email or web address] viral exanthem is an,physician or other h. 8/10 Decreased appetite Muscle and/or joint pain Cough or runny nose The symptoms of chickenpox may resemble other skin problems or medical conditions. Always consult a physician for a diagnosis. How is chickenpox spread? Once infected, chickenpox may take 10 to 21 days to develop. Chickenpox is contagious for one to two days before the appearance of the rash and until the blisters have dried and become scabs, which usually happens within 4 to 5 days of the onset of the rash. Children should stay home and away from other children until all of the blisters have scabbed over. Family members who have never had chickenpox have a 44 percent  chance of becoming infected when another family member in the household is infected. How is chickenpox diagnosed? Chickenpox is usually diagnosed based on a complete medical history and physical examination of the child. The rash of chickenpox is unique, and usually a  diagnosis can be made from a physical examination. What is the treatment for chickenpox? The specific treatment for chickenpox will be determined based on: The child's age, overall health, and medical history extent of the condition The child's tolerance for specific medications, procedures, or therapies Expectations for the course of the condition child or parent's opinion or preference Aspirin and the Risk of Reye Syndrome in Children Do not give aspirin to a child without first contacting the child's physician. Aspirin has been associated with Reye syndrome, a potentially serious or deadly disorder in children. Pediatricians and other healthcare providers recommend that aspirin (or any medication that contains aspirin) not be used to treat any viral illnesses in children. The treatment for chickenpox may include: Acetaminophen for fever (DO NOT GIVE ASPIRIN) Antibiotics for treating bacterial infections Calamine lotion (to relieve itching) 07/08/20, 10:30 AM Viral Exanthems Rashes - Conditions and Treatments  [email or web address] viral exanthem is an,physician or other h. 9/10 Antiviral drugs (for severe cases) Rest Increased fluid intake (to prevent dehydration) Cool baths with baking soda or Aveeno (to relieve itching) Children should not scratch the blisters, as this can lead to secondary bacterial infections. Keep the child's fingernails short to decrease the likelihood of scratching. What is immunity from chickenpox? Most individuals who have had chickenpox will be immune to the disease for the rest of their  lives. However, the virus remains dormant in nerve tissue and may reactivate, resulting in herpes zoster (shingles) later in life. Occasionally a secondary case of chickenpox does occur. Blood tests can confirm immunity to chickenpox in people who are unsure if they have had the disease. What complications are commonly associated with chickenpox? Complications can occur from chickenpox. Those most susceptible to severe cases of chickenpox are infants, adults, pregnant women (unborn babies may be infected if the mother has not had chickenpox prior to pregnancy), and people with impaired immune systems. Complications may include: Secondary bacterial infections Pneumonia Encephalitis (inflammation of the brain) Cerebellar ataxia (defective muscular coordination) Transverse myelitis (inflammation along the spinal cord) Reye syndrome (a serious condition which may affect all major systems or organs) Death

## 2020-07-08 NOTE — Progress Notes (Signed)
Shirley Rollins are scheduled for a virtual visit with your provider today.    Just as we do with appointments in the office, we must obtain your consent to participate.  Your consent will be active for this visit and any virtual visit you may have with one of our providers in the next 365 days.    If you have a MyChart account, I can also send a copy of this consent to you electronically.  All virtual visits are billed to your insurance company just like a traditional visit in the office.  As this is a virtual visit, video technology does not allow for your provider to perform a traditional examination.  This may limit your provider's ability to fully assess your condition.  If your provider identifies any concerns that need to be evaluated in person or the need to arrange testing such as labs, EKG, etc, we will make arrangements to do so.    Although advances in technology are sophisticated, we cannot ensure that it will always work on either your end or our end.  If the connection with a video visit is poor, we may have to switch to a telephone visit.  With either a video or telephone visit, we are not always able to ensure that we have a secure connection.   I need to obtain your verbal consent now.   Are you willing to proceed with your visit today?   Shirley Rollins has provided verbal consent on 07/08/2020 for a virtual visit (video or telephone).   Shirley Loveless, [name] 07/08/2020  10:10 AM    MyChart Video Visit    Virtual Visit via Video Note   This visit type was conducted due to national recommendations for restrictions regarding the COVID-19 Pandemic (e.g. social distancing) in an effort to limit this patient's exposure and mitigate transmission in our community. This patient is at least at moderate risk for complications without adequate follow up. This format is felt to be most appropriate for this patient at this time. Physical exam was limited by quality of the video and  audio technology used for the visit.   Patient location: Work, isolated in her office Provider location: Home office in [place], [place]  I discussed the limitations of evaluation and management by telemedicine and the availability of in person appointments. The patient expressed understanding and agreed to proceed.  Patient: Shirley Rollins   DOB: 1980-03-26   40 y.o. Female  MRN: [id number] Visit Date: 07/08/2020  Today's healthcare provider: [name], [name]   No chief complaint on file.  Subjective    Rash This is a new problem. The current episode started yesterday. The problem has been gradually worsening since onset. The affected locations include the neck, torso and abdomen. The rash is characterized by redness, swelling and itchiness. She was exposed to nothing. Associated symptoms include fatigue (felt it was from her menstrual cycle). Pertinent negatives include no anorexia, congestion, cough, diarrhea, facial edema, fever, joint pain, rhinorrhea, shortness of breath, sore throat or vomiting. Past treatments include nothing.     Patient Active Problem List   Diagnosis Date Noted  . Family history of pancreatic cancer 09/13/2017  . 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 Current tobacco use 11/26/2014  . Abnormal cells of cervix 11/18/2013   Past Medical History:  Diagnosis Date  . Anemia   . Migraines       Medications: Outpatient Medications Prior to Visit  Medication Sig  .  amphetamine-dextroamphetamine (ADDERALL XR) 25 MG 24 hr capsule TAKE 1 CAPSULE BY MOUTH EVERY MORNING  . Multiple Vitamin (MULTIVITAMIN) tablet Take 1 tablet by mouth daily.   No facility-administered medications prior to visit.    Review of Systems  Constitutional: Positive for fatigue (felt it was from her menstrual cycle). Negative for fever.  HENT: Negative for congestion, rhinorrhea and sore throat.   Respiratory: Negative for cough and shortness of  breath.   Cardiovascular: Negative for chest pain and leg swelling.  Gastrointestinal: Negative for abdominal pain, anorexia, diarrhea and vomiting.  Musculoskeletal: Negative for joint pain.  Skin: Positive for color change and rash.  Neurological: Negative for dizziness and headaches.    Last CBC Lab Results  Component Value Date   WBC 5.6 10/19/2019   HGB 14.1 10/19/2019   HCT 41.9 10/19/2019   MCV 96 10/19/2019   MCH 32.3 10/19/2019   RDW 11.5 (L) 10/19/2019   PLT 286 10/19/2019   Last metabolic panel Lab Results  Component Value Date   GLUCOSE 88 10/19/2019   NA 136 10/19/2019   K 4.3 10/19/2019   CL 101 10/19/2019   CO2 22 10/19/2019   BUN 10 10/19/2019   CREATININE 0.68 10/19/2019   GFRNONAA 109 10/19/2019   GFRAA 126 10/19/2019   CALCIUM 10.2 10/19/2019   PROT 7.6 10/19/2019   ALBUMIN 5.0 (H) 10/19/2019   LABGLOB 2.6 10/19/2019   AGRATIO 1.9 10/19/2019   BILITOT 0.4 10/19/2019   ALKPHOS 40 (L) 10/19/2019   AST 19 10/19/2019   ALT 12 10/19/2019      Objective    There were no vitals taken for this visit. BP Readings from Last 3 Encounters:  04/18/20 106/71  10/19/19 112/74  09/10/19 118/80   Wt Readings from Last 3 Encounters:  04/18/20 117 lb (53.1 kg)  10/19/19 118 lb (53.5 kg)  09/10/19 112 lb 11.2 oz (51.1 kg)      Physical Exam Vitals reviewed.  Constitutional:      General: She is not in acute distress.    Appearance: Normal appearance. She is well-developed. She is not ill-appearing.  HENT:     Head: Normocephalic and atraumatic.  Pulmonary:     Effort: Pulmonary effort is normal. No respiratory distress.  Musculoskeletal:     Cervical back: Normal range of motion and neck supple.  Skin:    Findings: Rash (erythematous fine papular rash diffuse over stomach; red splotches with heat and swelling noted on left side of neck) present.  Neurological:     Mental Status: She is alert.  Psychiatric:        Mood and Affect: Mood normal.         Behavior: Behavior normal.        Thought Content: Thought content normal.        Judgment: Judgment normal.       Assessment & Plan     1. Viral exanthem - Suspect viral exanthem as source of rash, despite patient not having other symptoms at this time - Discussed conservative management, moisturizer and cool compresses - Triamcinolone cream sent in - Can also use Benadryl topical cream  - Monitor for other symptoms  No follow-ups on file.     I discussed the assessment and treatment plan with the patient. The patient was provided an opportunity to ask questions and all were answered. The patient agreed with the plan and demonstrated an understanding of the instructions.   The patient was advised to call back  or seek an in-person evaluation if the symptoms worsen or if the condition fails to improve as anticipated.  I provided 15 minutes of face-to-face time during this encounter via MyChart Video enabled encounter.   Reine Just [facility] [phone] (phone) [phone] (fax)  [facility]

## 2020-07-08 NOTE — Telephone Encounter (Signed)
LMTCB 07/08/2020    Thanks,   -Vernona Rieger

## 2020-07-19 ENCOUNTER — Other Ambulatory Visit: Payer: Self-pay

## 2020-07-19 ENCOUNTER — Other Ambulatory Visit: Payer: Self-pay | Admitting: Family Medicine

## 2020-07-19 DIAGNOSIS — F988 Other specified behavioral and emotional disorders with onset usually occurring in childhood and adolescence: Secondary | ICD-10-CM

## 2020-07-19 NOTE — Telephone Encounter (Signed)
Requested medication (s) are due for refill today: yes  Requested medication (s) are on the active medication list:  yes   Last refill:  04/18/20  Future visit scheduled: previously scheduled with Marcelino Duster Flinchum 10/20/20  Notes to clinic: not delegated   Requested Prescriptions  Pending Prescriptions Disp Refills   amphetamine-dextroamphetamine (ADDERALL XR) 25 MG 24 hr capsule 90 capsule 0    Sig: TAKE 1 CAPSULE BY MOUTH EVERY MORNING      Not Delegated - Psychiatry:  Stimulants/ADHD Failed - 07/19/2020 11:49 AM      Failed - This refill cannot be delegated      Failed - Urine Drug Screen completed in last 360 days      Failed - Valid encounter within last 3 months    Recent Outpatient Visits           3 months ago ADD (attention deficit disorder) without hyperactivity   [facility] [place], Alessandra Bevels, [place]   9 months ago Annual physical exam   [facility] [place], [place]   10 months ago Breast mass, right   [facility] [place], Alessandra Bevels, [place]   1 year ago Annual physical exam   [facility] [place], Alessandra Bevels, [place]   1 year ago Open wound of toe, initial encounter   [facility] [place], Lavella Hammock, [place]       Future Appointments             In 3 months Flinchum, Eula Fried, FNP [facility], [facility]

## 2020-07-20 ENCOUNTER — Other Ambulatory Visit: Payer: Self-pay

## 2020-07-20 MED FILL — Amphetamine-Dextroamphetamine Cap ER 24HR 25 MG: ORAL | 90 days supply | Qty: 90 | Fill #0 | Status: AC

## 2020-07-20 NOTE — Telephone Encounter (Signed)
Pt stated today is her last pill, please advise when the prescription will be refilled. Please call back when Rx has been sent in.

## 2020-10-08 ENCOUNTER — Encounter: Payer: Self-pay | Admitting: Nurse Practitioner

## 2020-10-08 DIAGNOSIS — E7800 Pure hypercholesterolemia, unspecified: Secondary | ICD-10-CM | POA: Insufficient documentation

## 2020-10-11 ENCOUNTER — Ambulatory Visit (INDEPENDENT_AMBULATORY_CARE_PROVIDER_SITE_OTHER): Payer: No Typology Code available for payment source | Admitting: Nurse Practitioner

## 2020-10-11 ENCOUNTER — Other Ambulatory Visit: Payer: Self-pay

## 2020-10-11 ENCOUNTER — Other Ambulatory Visit (HOSPITAL_COMMUNITY): Payer: Self-pay

## 2020-10-11 ENCOUNTER — Encounter: Payer: Self-pay | Admitting: Nurse Practitioner

## 2020-10-11 VITALS — BP 94/65 | HR 85 | Temp 98.5°F | Ht 65.0 in | Wt 118.8 lb

## 2020-10-11 DIAGNOSIS — F988 Other specified behavioral and emotional disorders with onset usually occurring in childhood and adolescence: Secondary | ICD-10-CM

## 2020-10-11 DIAGNOSIS — Z800 Family history of malignant neoplasm of digestive organs: Secondary | ICD-10-CM | POA: Diagnosis not present

## 2020-10-11 DIAGNOSIS — Z87891 Personal history of nicotine dependence: Secondary | ICD-10-CM | POA: Diagnosis not present

## 2020-10-11 DIAGNOSIS — Z1231 Encounter for screening mammogram for malignant neoplasm of breast: Secondary | ICD-10-CM

## 2020-10-11 DIAGNOSIS — Z7689 Persons encountering health services in other specified circumstances: Secondary | ICD-10-CM

## 2020-10-11 DIAGNOSIS — Z8742 Personal history of other diseases of the female genital tract: Secondary | ICD-10-CM

## 2020-10-11 MED ORDER — AMPHETAMINE-DEXTROAMPHET ER 25 MG PO CP24
ORAL_CAPSULE | Freq: Every morning | ORAL | 0 refills | Status: DC
  Filled 2020-10-21: qty 30, 30d supply, fill #0

## 2020-10-11 NOTE — Patient Instructions (Signed)
[facility]  Address: [address], , [place] [address]  Phone: [phone]  Mammogram A mammogram is an X-ray of the breasts. This is done to check for changes that are not normal. This test can look for changes that may be caused by breast cancer or other problems. Mammograms are regularly done on women beginning at age 42. A man may have a mammogram if he has a lump or swelling in his breast. Tell a doctor: About any allergies you have. If you have breast implants. If you have had breast disease, biopsy, or surgery. If you have a family history of breast cancer. If you are breastfeeding. Whether you are pregnant or may be pregnant. What are the risks? Generally, this is a safe procedure. But problems may occur, including: Being exposed to radiation. Radiation levels are very low with this test. The need for more tests. The results were not read properly. Trouble finding breast cancer in women with dense breasts. What happens before the test? Have this test done about 1-2 weeks after your menstrual period. This is often when your breasts are the least tender. If you are visiting a new doctor or clinic, have any past mammogram images sent to your new doctor's office. Wash your breasts and under your arms on the day of the test. Do not use deodorants, perfumes, lotions, or powders on the day of the test. Take off any jewelry from your neck. Wear clothes that you can change into and out of easily. What happens during the test?  You will take off your clothes from the waist up. You will put on a gown. You will stand in front of the X-ray machine. Each breast will be placed between two plastic or glass plates. The plates will press down on your breast for a few seconds. Try to relax. This does not cause any harm to your breasts. It may not feel comfortable, but it will be very brief. X-rays will be taken from different angles of each  breast. The procedure may vary among doctors and hospitals. What can I expect after the test? The mammogram will be read by a specialist (radiologist). You may need to do parts of the test again. This depends on the quality of the images. You may go back to your normal activities. It is up to you to get the results of your test. Ask how to get your results when they are ready. Summary A mammogram is an X-ray of the breasts. It looks for changes that may be caused by breast cancer or other problems. A man may have this test if he has a lump or swelling in his breast. Before the test, tell your doctor about any breast problems that you have had in the past. Have this test done about 1-2 weeks after your menstrual period. Ask when your test results will be ready. Make sure you get your test results. This information is not intended to replace advice given to you by your health care provider. Make sure you discuss any questions you have with your health care provider. Document Revised: 12/14/2019 Document Reviewed: 12/14/2019 Elsevier Patient Education  2022 Elsevier Inc.   

## 2020-10-11 NOTE — Assessment & Plan Note (Signed)
Chronic, ongoing.  Discussed at length office guidelines -- she has 8 pills left today.  Plan on UDS next visit and controlled substance contract, discussed with her. Will send 30 day supply today since she is low and next visit is physical on August 25th.  She agrees to every 3 month visits, can be virtual if needed.  Return on August 25th as scheduled.

## 2020-10-11 NOTE — Assessment & Plan Note (Signed)
In mother, passed at age 42.  Will plan on monitoring labs yearly and as needed if symptoms. 

## 2020-10-11 NOTE — Assessment & Plan Note (Signed)
Noted several years ago, recent paps have been normal.  Will plan on repeat pap at physical upcoming.

## 2020-10-11 NOTE — Progress Notes (Signed)
New Patient Office Visit  Subjective:  Patient ID: Shirley Rollins, female    DOB: Mar 02, 1980  Age: 40 y.o. MRN: [id number]  CC:  Chief Complaint  Patient presents with   Establish Care    Patient states she is also needing an order for a Mammogram.    Medication Refill    Patient states she will need a refill on her prescription.     HPI Shirley Rollins presents for new patient visit to establish care.  Introduced to Publishing rights manager role and practice setting.  All questions answered.  Discussed provider/patient relationship and expectations.  Was previously at [facility], previously [facility] followed.   History of smoking -- off and on for 15 years, social smoker.  She quit 2 years ago.  Family pancreatic cancer history with her mother, she had whipple with complications -- she was 68.    ADD She continues on Adderall XR 25 MG daily.  She been on this regimen for 8 years and overall works well.  Last fill on PDMP review 07/20/20. Status: stable Satisfied with current therapy: yes Medication compliance:  good compliance Controlled substance contract: yes Previous psychiatry evaluation: no Previous medications: yes adderall XR   Taking meds on weekends/vacations: yes Work/school performance:  good Difficulty sustaining attention/completing tasks:  occasional Distracted by extraneous stimuli: no Does not listen when spoken to: no  Fidgets with hands or feet: no Unable to stay in seat: no Blurts out/interrupts others: no ADHD Medication Side Effects: no    Decreased appetite: no    Headache: no    Sleeping disturbance pattern: no    Irritability: no    Rebound effects (worse than baseline) off medication: no    Anxiousness: no    Dizziness: no    Tics: no   Past Medical History:  Diagnosis Date   Anemia    Migraines     Past Surgical History:  Procedure Laterality Date   etopic pregnancy  1999   TONSILLECTOMY  in 14's    Family History   Problem Relation Age of Onset   Diabetes Mother    Pancreatic cancer Mother 72   Cancer Mother        pancreatic   Heart attack Maternal Grandfather    Heart disease Maternal Grandfather     Social History   Socioeconomic History   Marital status: Divorced    Spouse name: Not on file   Number of children: 1   Years of education: Not on file   Highest education level: Not on file  Occupational History   Occupation: dietician  Tobacco Use   Smoking status: Former    Types: Cigarettes    Quit date: 09/01/2018    Years since quitting: 2.1   Smokeless tobacco: Never   Tobacco comments:    3/4 of a pack per day  Vaping Use   Vaping Use: Never used  Substance and Sexual Activity   Alcohol use: Yes    Comment: occassional   Drug use: No   Sexual activity: Not Currently  Other Topics Concern   Not on file  Social History Narrative   Not on file   Social Determinants of Health   Financial Resource Strain: Low Risk    Difficulty of Paying Living Expenses: Not very hard  Food Insecurity: No Food Insecurity   Worried About Running Out of Food in the Last Year: Never true   Ran Out of Food in the Last Year: Never  true  Transportation Needs: No Transportation Needs   Lack of Transportation (Medical): No   Lack of Transportation (Non-Medical): No  Physical Activity: Sufficiently Active   Days of Exercise per Week: 7 days   Minutes of Exercise per Session: 30 min  Stress: No Stress Concern Present   Feeling of Stress : Only a little  Social Connections: Moderately Isolated   Frequency of Communication with Friends and Family: More than three times a week   Frequency of Social Gatherings with Friends and Family: More than three times a week   Attends Religious Services: More than 4 times per year   Active Member of [employer] or Organizations: No   Attends Engineer, structural: Never   Marital Status: Divorced  Catering manager Violence: Not At Risk   Fear of Current  or Ex-Partner: No   Emotionally Abused: No   Physically Abused: No   Sexually Abused: No    ROS Review of Systems  Constitutional:  Negative for activity change, appetite change, diaphoresis, fatigue and fever.  Respiratory:  Negative for cough, chest tightness and shortness of breath.   Cardiovascular:  Negative for chest pain, palpitations and leg swelling.  Gastrointestinal: Negative.   Neurological: Negative.   Psychiatric/Behavioral: Negative.     Objective:   Today's Vitals: BP 94/65   Pulse 85   Temp 98.5 F (36.9 C) (Oral)   Ht 5\' 5"  (1.651 m)   Wt 118 lb 12.8 oz (53.9 kg)   SpO2 100%   BMI 19.77 kg/m   Physical Exam Vitals and nursing note reviewed.  Constitutional:      General: She is awake. She is not in acute distress.    Appearance: She is well-developed and well-groomed. She is not ill-appearing or toxic-appearing.  HENT:     Head: Normocephalic.     Right Ear: Hearing normal.     Left Ear: Hearing normal.  Eyes:     General: Lids are normal.        Right eye: No discharge.        Left eye: No discharge.     Conjunctiva/sclera: Conjunctivae normal.     Pupils: Pupils are equal, round, and reactive to light.  Neck:     Thyroid: No thyromegaly.     Vascular: No carotid bruit.  Cardiovascular:     Rate and Rhythm: Normal rate and regular rhythm.     Heart sounds: Normal heart sounds. No murmur heard.   No gallop.  Pulmonary:     Effort: Pulmonary effort is normal. No accessory muscle usage or respiratory distress.     Breath sounds: Normal breath sounds.  Abdominal:     General: Bowel sounds are normal.     Palpations: Abdomen is soft. There is no hepatomegaly or splenomegaly.  Musculoskeletal:     Cervical back: Normal range of motion and neck supple.     Right lower leg: No edema.     Left lower leg: No edema.  Lymphadenopathy:     Cervical: No cervical adenopathy.  Skin:    General: Skin is warm and dry.  Neurological:     Mental Status:  She is alert and oriented to person, place, and time.  Psychiatric:        Attention and Perception: Attention normal.        Mood and Affect: Mood normal.        Speech: Speech normal.        Behavior: Behavior normal. Behavior is cooperative.  Thought Content: Thought content normal.    Assessment & Plan:   Problem List Items Addressed This Visit       Other   History of abnormal cervical Pap smear    Noted several years ago, recent paps have been normal.  Will plan on repeat pap at physical upcoming.      History of smoking    Recommend continued cessation, quit about 8 years ago.      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  Discussed at length office guidelines -- she has 8 pills left today.  Plan on UDS next visit and controlled substance contract, discussed with her. Will send 30 day supply today since she is low and next visit is physical on August 25th.  She agrees to every 3 month visits, can be virtual if needed.  Return on August 25th as scheduled.      Relevant Medications   amphetamine-dextroamphetamine (ADDERALL XR) 25 MG 24 hr capsule (Start on 10/19/2020)   Family history of pancreatic cancer    In mother, passed at age 73.  Will plan on monitoring labs yearly and as needed if symptoms.      Other Visit Diagnoses     Encounter to establish care    -  Primary   Encounter for screening mammogram for malignant neoplasm of breast       Mammogram ordered   Relevant Orders   MM 3D SCREEN BREAST BILATERAL       Outpatient Encounter Medications as of 10/11/2020  Medication Sig   Multiple Vitamin (MULTIVITAMIN) tablet Take 1 tablet by mouth daily.   [DISCONTINUED] amphetamine-dextroamphetamine (ADDERALL XR) 25 MG 24 hr capsule TAKE 1 CAPSULE BY MOUTH EVERY MORNING   [DISCONTINUED] triamcinolone cream (KENALOG) 0.1 % Apply 1 application topically 2 (two) times daily.   [START ON 10/19/2020] amphetamine-dextroamphetamine  (ADDERALL XR) 25 MG 24 hr capsule TAKE 1 CAPSULE BY MOUTH EVERY MORNING   No facility-administered encounter medications on file as of 10/11/2020.    Follow-up: Return for as scheduled on August 25th.   Marjie Skiff, NP

## 2020-10-11 NOTE — Assessment & Plan Note (Signed)
Recommend continued cessation, quit about 8 years ago.

## 2020-10-20 ENCOUNTER — Other Ambulatory Visit: Payer: Self-pay

## 2020-10-20 ENCOUNTER — Encounter: Payer: No Typology Code available for payment source | Admitting: Adult Health

## 2020-10-20 ENCOUNTER — Ambulatory Visit (INDEPENDENT_AMBULATORY_CARE_PROVIDER_SITE_OTHER): Payer: No Typology Code available for payment source | Admitting: Nurse Practitioner

## 2020-10-20 ENCOUNTER — Other Ambulatory Visit (HOSPITAL_COMMUNITY): Payer: Self-pay

## 2020-10-20 ENCOUNTER — Encounter: Payer: Self-pay | Admitting: Nurse Practitioner

## 2020-10-20 VITALS — BP 123/81 | HR 54 | Temp 96.5°F

## 2020-10-20 DIAGNOSIS — Z800 Family history of malignant neoplasm of digestive organs: Secondary | ICD-10-CM

## 2020-10-20 DIAGNOSIS — E7800 Pure hypercholesterolemia, unspecified: Secondary | ICD-10-CM | POA: Diagnosis not present

## 2020-10-20 DIAGNOSIS — Z833 Family history of diabetes mellitus: Secondary | ICD-10-CM

## 2020-10-20 DIAGNOSIS — Z0000 Encounter for general adult medical examination without abnormal findings: Secondary | ICD-10-CM | POA: Diagnosis not present

## 2020-10-20 DIAGNOSIS — F988 Other specified behavioral and emotional disorders with onset usually occurring in childhood and adolescence: Secondary | ICD-10-CM | POA: Diagnosis not present

## 2020-10-20 DIAGNOSIS — Z79899 Other long term (current) drug therapy: Secondary | ICD-10-CM

## 2020-10-20 MED ORDER — AMPHETAMINE-DEXTROAMPHET ER 25 MG PO CP24
25.0000 mg | ORAL_CAPSULE | ORAL | 0 refills | Status: DC
Start: 2020-11-18 — End: 2020-12-28
  Filled 2020-11-21: qty 30, 30d supply, fill #0

## 2020-10-20 MED ORDER — AMPHETAMINE-DEXTROAMPHET ER 25 MG PO CP24
25.0000 mg | ORAL_CAPSULE | ORAL | 0 refills | Status: DC
Start: 2020-12-19 — End: 2021-02-01

## 2020-10-20 NOTE — Patient Instructions (Signed)

## 2020-10-20 NOTE — Assessment & Plan Note (Signed)
Chronic, ongoing.  UDS and controlled substance contract obtained today.  Will send 30 day supply x 3 scripts.  She agrees to every 3 month visits, can be virtual if needed.  Return in 3 months.

## 2020-10-20 NOTE — Assessment & Plan Note (Signed)
Noted on past labs, check lipid panel today and continue diet focus at home.

## 2020-10-20 NOTE — Progress Notes (Signed)
BP 123/81   Pulse (!) 54   Temp (!) 96.5 F (35.8 C) (Oral)   LMP 10/19/2020 (Exact Date)   SpO2 100%    Subjective:    Patient ID: Shirley LarssonJessica S Madrid, female    DOB: 10/05/1980, 40 y.o.   MRN: [id number]  HPI: Shirley Rollins is a 40 y.o. female presenting on 10/20/2020 for comprehensive medical examination. Current medical complaints include:none  She currently lives with: self and son Menopausal Symptoms: no  ADD She continues on Adderall XR 25 MG daily.  She been on this regimen for 8 years and overall works well.  Last fill on PDMP review 07/20/20. Status: stable Satisfied with current therapy: yes Medication compliance:  good compliance Controlled substance contract: yes Previous psychiatry evaluation: no Previous medications: yes adderall XR   Taking meds on weekends/vacations: yes Work/school performance:  good Difficulty sustaining attention/completing tasks:  occasional Distracted by extraneous stimuli: no Does not listen when spoken to: no  Fidgets with hands or feet: no Unable to stay in seat: no Blurts out/interrupts others: no ADHD Medication Side Effects: no    Decreased appetite: no    Headache: no    Sleeping disturbance pattern: no    Irritability: no    Rebound effects (worse than baseline) off medication: no    Anxiousness: no    Dizziness: no    Tics: no   Depression Screen done today and results listed below:  Depression screen [facility] 2/9 10/20/2020 04/18/2020 10/19/2019 10/13/2018 09/13/2017  Decreased Interest 0 0 1 0 0  Down, Depressed, Hopeless 0 1 1 1 1   PHQ - 2 Score 0 1 2 1 1   Altered sleeping - 3 0 0 0  Tired, decreased energy - 3 2 2  0  Change in appetite - 0 0 0 0  Feeling bad or failure about yourself  - 1 0 1 1  Trouble concentrating - 1 3 2  0  Moving slowly or fidgety/restless - 1 0 2 0  Suicidal thoughts - 0 0 0 0  PHQ-9 Score - 10 7 8 2   Difficult doing work/chores - Not difficult at all Very difficult Not difficult at all Not  difficult at all    The patient does not have a history of falls. I did not complete a risk assessment for falls. A plan of care for falls was not documented.   Past Medical History:  Past Medical History:  Diagnosis Date   Anemia    Migraines     Surgical History:  Past Surgical History:  Procedure Laterality Date   etopic pregnancy  1999   TONSILLECTOMY  in 86's    Medications:  Current Outpatient Medications on File Prior to Visit  Medication Sig   amphetamine-dextroamphetamine (ADDERALL XR) 25 MG 24 hr capsule TAKE 1 CAPSULE BY MOUTH EVERY MORNING   Multiple Vitamin (MULTIVITAMIN) tablet Take 1 tablet by mouth daily.   No current facility-administered medications on file prior to visit.    Allergies:  Allergies  Allergen Reactions   Penicillin G Anaphylaxis   Sulfa Antibiotics Other (See Comments)    Social History:  Social History   Socioeconomic History   Marital status: Divorced    Spouse name: Not on file   Number of children: 1   Years of education: Not on file   Highest education level: Not on file  Occupational History   Occupation: dietician  Tobacco Use   Smoking status: Former    Types: Cigarettes  Quit date: 09/01/2018    Years since quitting: 2.1   Smokeless tobacco: Never   Tobacco comments:    3/4 of a pack per day  Vaping Use   Vaping Use: Never used  Substance and Sexual Activity   Alcohol use: Yes    Comment: occassional   Drug use: No   Sexual activity: Not Currently  Other Topics Concern   Not on file  Social History Narrative   Not on file   Social Determinants of Health   Financial Resource Strain: Low Risk    Difficulty of Paying Living Expenses: Not very hard  Food Insecurity: No Food Insecurity   Worried About Running Out of Food in the Last Year: Never true   Ran Out of Food in the Last Year: Never true  Transportation Needs: No Transportation Needs   Lack of Transportation (Medical): No   Lack of Transportation  (Non-Medical): No  Physical Activity: Sufficiently Active   Days of Exercise per Week: 7 days   Minutes of Exercise per Session: 30 min  Stress: No Stress Concern Present   Feeling of Stress : Only a little  Social Connections: Moderately Isolated   Frequency of Communication with Friends and Family: More than three times a week   Frequency of Social Gatherings with Friends and Family: More than three times a week   Attends Religious Services: More than 4 times per year   Active Member of [employer] or Organizations: No   Attends Engineer, structural: Never   Marital Status: Divorced  Catering manager Violence: Not At Risk   Fear of Current or Ex-Partner: No   Emotionally Abused: No   Physically Abused: No   Sexually Abused: No   Social History   Tobacco Use  Smoking Status Former   Types: Cigarettes   Quit date: 09/01/2018   Years since quitting: 2.1  Smokeless Tobacco Never  Tobacco Comments   3/4 of a pack per day   Social History   Substance and Sexual Activity  Alcohol Use Yes   Comment: occassional    Family History:  Family History  Problem Relation Age of Onset   Diabetes Mother    Pancreatic cancer Mother 73   Cancer Mother        pancreatic   Heart attack Maternal Grandfather    Heart disease Maternal Grandfather     Past medical history, surgical history, medications, allergies, family history and social history reviewed with patient today and changes made to appropriate areas of the chart.   ROS All other ROS negative except what is listed above and in the HPI.      Objective:    BP 123/81   Pulse (!) 54   Temp (!) 96.5 F (35.8 C) (Oral)   LMP 10/19/2020 (Exact Date)   SpO2 100%   Wt Readings from Last 3 Encounters:  10/11/20 118 lb 12.8 oz (53.9 kg)  04/18/20 117 lb (53.1 kg)  10/19/19 118 lb (53.5 kg)    Physical Exam Vitals and nursing note reviewed. Exam conducted with a chaperone present.  Constitutional:      General: She is  awake. She is not in acute distress.    Appearance: She is well-developed. She is not ill-appearing.  HENT:     Head: Normocephalic and atraumatic.     Right Ear: Hearing, tympanic membrane, ear canal and external ear normal. No drainage.     Left Ear: Hearing, tympanic membrane, ear canal and external ear normal.  No drainage.     Nose: Nose normal.     Right Sinus: No maxillary sinus tenderness or frontal sinus tenderness.     Left Sinus: No maxillary sinus tenderness or frontal sinus tenderness.     Mouth/Throat:     Mouth: Mucous membranes are moist.     Pharynx: Oropharynx is clear. Uvula midline. No pharyngeal swelling, oropharyngeal exudate or posterior oropharyngeal erythema.  Eyes:     General: Lids are normal.        Right eye: No discharge.        Left eye: No discharge.     Extraocular Movements: Extraocular movements intact.     Conjunctiva/sclera: Conjunctivae normal.     Pupils: Pupils are equal, round, and reactive to light.     Visual Fields: Right eye visual fields normal and left eye visual fields normal.  Neck:     Thyroid: No thyromegaly.     Vascular: No carotid bruit.     Trachea: Trachea normal.  Cardiovascular:     Rate and Rhythm: Normal rate and regular rhythm.     Heart sounds: Normal heart sounds. No murmur heard.   No gallop.  Pulmonary:     Effort: Pulmonary effort is normal. No accessory muscle usage or respiratory distress.     Breath sounds: Normal breath sounds.  Chest:  Breasts:    Right: Normal.     Left: Normal.  Abdominal:     General: Bowel sounds are normal.     Palpations: Abdomen is soft. There is no hepatomegaly or splenomegaly.     Tenderness: There is no abdominal tenderness.  Musculoskeletal:        General: Normal range of motion.     Cervical back: Normal range of motion and neck supple.     Right lower leg: No edema.     Left lower leg: No edema.  Lymphadenopathy:     Head:     Right side of head: No submental,  submandibular, tonsillar, preauricular or posterior auricular adenopathy.     Left side of head: No submental, submandibular, tonsillar, preauricular or posterior auricular adenopathy.     Cervical: No cervical adenopathy.     Upper Body:     Right upper body: No supraclavicular, axillary or pectoral adenopathy.     Left upper body: No supraclavicular, axillary or pectoral adenopathy.  Skin:    General: Skin is warm and dry.     Capillary Refill: Capillary refill takes less than 2 seconds.     Findings: No rash.  Neurological:     Mental Status: She is alert and oriented to person, place, and time.     Cranial Nerves: Cranial nerves are intact.     Gait: Gait is intact.     Deep Tendon Reflexes: Reflexes are normal and symmetric.     Reflex Scores:      Brachioradialis reflexes are 2+ on the right side and 2+ on the left side.      Patellar reflexes are 2+ on the right side and 2+ on the left side. Psychiatric:        Attention and Perception: Attention normal.        Mood and Affect: Mood normal.        Speech: Speech normal.        Behavior: Behavior normal. Behavior is cooperative.        Thought Content: Thought content normal.        Judgment: Judgment normal.  Results for orders placed or performed in visit on 10/19/19  CBC w/Diff/Platelet  Result Value Ref Range   WBC 5.6 3.4 - 10.8 x10E3/uL   RBC 4.36 3.77 - 5.28 x10E6/uL   Hemoglobin 14.1 11.1 - 15.9 g/dL   Hematocrit 16.1 09.6 - 46.6 %   MCV 96 79 - 97 fL   MCH 32.3 26.6 - 33.0 pg   MCHC 33.7 31.5 - 35.7 g/dL   RDW 04.5 (L) 40.9 - 81.1 %   Platelets 286 150 - 450 x10E3/uL   Neutrophils 51 Not Estab. %   Lymphs 35 Not Estab. %   Monocytes 11 Not Estab. %   Eos 2 Not Estab. %   Basos 1 Not Estab. %   Neutrophils Absolute 2.8 1.4 - 7.0 x10E3/uL   Lymphocytes Absolute 1.9 0.7 - 3.1 x10E3/uL   Monocytes Absolute 0.6 0.1 - 0.9 x10E3/uL   EOS (ABSOLUTE) 0.1 0.0 - 0.4 x10E3/uL   Basophils Absolute 0.1 0.0 - 0.2  x10E3/uL   Immature Granulocytes 0 Not Estab. %   Immature Grans (Abs) 0.0 0.0 - 0.1 x10E3/uL  Comprehensive Metabolic Panel (CMET)  Result Value Ref Range   Glucose 88 65 - 99 mg/dL   BUN 10 6 - 24 mg/dL   Creatinine, Ser 9.14 0.57 - 1.00 mg/dL   GFR calc non Af Amer 109 >59 mL/min/1.73   GFR calc Af Amer 126 >59 mL/min/1.73   BUN/Creatinine Ratio 15 9 - 23   Sodium 136 134 - 144 mmol/L   Potassium 4.3 3.5 - 5.2 mmol/L   Chloride 101 96 - 106 mmol/L   CO2 22 20 - 29 mmol/L   Calcium 10.2 8.7 - 10.2 mg/dL   Total Protein 7.6 6.0 - 8.5 g/dL   Albumin 5.0 (H) 3.8 - 4.8 g/dL   Globulin, Total 2.6 1.5 - 4.5 g/dL   Albumin/Globulin Ratio 1.9 1.2 - 2.2   Bilirubin Total 0.4 0.0 - 1.2 mg/dL   Alkaline Phosphatase 40 (L) 48 - 121 IU/L   AST 19 0 - 40 IU/L   ALT 12 0 - 32 IU/L  Lipid Panel With LDL/HDL Ratio  Result Value Ref Range   Cholesterol, Total 196 100 - 199 mg/dL   Triglycerides 782 (H) 0 - 149 mg/dL   HDL 51 >95 mg/dL   VLDL Cholesterol Cal 29 5 - 40 mg/dL   LDL Chol Calc (NIH) 621 (H) 0 - 99 mg/dL   LDL/HDL Ratio 2.3 0.0 - 3.2 ratio  TSH  Result Value Ref Range   TSH 2.210 0.450 - 4.500 uIU/mL  HgB A1c  Result Value Ref Range   Hgb A1c MFr Bld 5.2 4.8 - 5.6 %   Est. average glucose Bld gHb Est-mCnc 103 mg/dL  Hepatitis C Antibody  Result Value Ref Range   Hep C Virus Ab 0.2 0.0 - 0.9 s/co ratio      Assessment & Plan:   Problem List Items Addressed This Visit   None    Follow up plan: No follow-ups on file.   LABORATORY TESTING:  - Pap smear: not today, is on cycle  IMMUNIZATIONS:   - Tdap: Tetanus vaccination status reviewed: last tetanus booster within 10 years. - Influenza: Up to date - Pneumovax: Not applicable - Prevnar: Not applicable - HPV: Not applicable - Zostavax vaccine: Not applicable  SCREENING: -Mammogram: Up to date  - Colonoscopy: Not applicable  - Bone Density: Not applicable  -Hearing Test: Not applicable  -Spirometry: Not  applicable  PATIENT COUNSELING:   Advised to take 1 mg of folate supplement per day if capable of pregnancy.   Sexuality: Discussed sexually transmitted diseases, partner selection, use of condoms, avoidance of unintended pregnancy  and contraceptive alternatives.   Advised to avoid cigarette smoking.  I discussed with the patient that most people either abstain from alcohol or drink within safe limits (<=14/week and <=4 drinks/occasion for males, <=7/weeks and <= 3 drinks/occasion for females) and that the risk for alcohol disorders and other health effects rises proportionally with the number of drinks per week and how often a drinker exceeds daily limits.  Discussed cessation/primary prevention of drug use and availability of treatment for abuse.   Diet: Encouraged to adjust caloric intake to maintain  or achieve ideal body weight, to reduce intake of dietary saturated fat and total fat, to limit sodium intake by avoiding high sodium foods and not adding table salt, and to maintain adequate dietary potassium and calcium preferably from fresh fruits, vegetables, and low-fat dairy products.    Stressed the importance of regular exercise  Injury prevention: Discussed safety belts, safety helmets, smoke detector, smoking near bedding or upholstery.   Dental health: Discussed importance of regular tooth brushing, flossing, and dental visits.    NEXT PREVENTATIVE PHYSICAL DUE IN 1 YEAR. No follow-ups on file.

## 2020-10-20 NOTE — Assessment & Plan Note (Signed)
In mother, passed at age 42.  Will plan on monitoring labs yearly and as needed if symptoms. 

## 2020-10-21 ENCOUNTER — Other Ambulatory Visit (HOSPITAL_COMMUNITY): Payer: Self-pay

## 2020-10-21 LAB — TSH: TSH: 2.6 u[IU]/mL (ref 0.450–4.500)

## 2020-10-21 LAB — CBC WITH DIFFERENTIAL/PLATELET
Basophils Absolute: 0.1 10*3/uL (ref 0.0–0.2)
Basos: 1 %
EOS (ABSOLUTE): 0.1 10*3/uL (ref 0.0–0.4)
Eos: 1 %
Hematocrit: 42.2 % (ref 34.0–46.6)
Hemoglobin: 14.1 g/dL (ref 11.1–15.9)
Immature Grans (Abs): 0 10*3/uL (ref 0.0–0.1)
Immature Granulocytes: 0 %
Lymphocytes Absolute: 2 10*3/uL (ref 0.7–3.1)
Lymphs: 30 %
MCH: 31.7 pg (ref 26.6–33.0)
MCHC: 33.4 g/dL (ref 31.5–35.7)
MCV: 95 fL (ref 79–97)
Monocytes Absolute: 0.5 10*3/uL (ref 0.1–0.9)
Monocytes: 8 %
Neutrophils Absolute: 3.9 10*3/uL (ref 1.4–7.0)
Neutrophils: 60 %
Platelets: 321 10*3/uL (ref 150–450)
RBC: 4.45 x10E6/uL (ref 3.77–5.28)
RDW: 11.4 % — ABNORMAL LOW (ref 11.7–15.4)
WBC: 6.5 10*3/uL (ref 3.4–10.8)

## 2020-10-21 LAB — COMPREHENSIVE METABOLIC PANEL
ALT: 17 IU/L (ref 0–32)
AST: 22 IU/L (ref 0–40)
Albumin/Globulin Ratio: 1.9 (ref 1.2–2.2)
Albumin: 5.2 g/dL — ABNORMAL HIGH (ref 3.8–4.8)
Alkaline Phosphatase: 42 IU/L — ABNORMAL LOW (ref 44–121)
BUN/Creatinine Ratio: 10 (ref 9–23)
BUN: 7 mg/dL (ref 6–24)
Bilirubin Total: 0.6 mg/dL (ref 0.0–1.2)
CO2: 22 mmol/L (ref 20–29)
Calcium: 10 mg/dL (ref 8.7–10.2)
Chloride: 99 mmol/L (ref 96–106)
Creatinine, Ser: 0.67 mg/dL (ref 0.57–1.00)
Globulin, Total: 2.8 g/dL (ref 1.5–4.5)
Glucose: 95 mg/dL (ref 65–99)
Potassium: 4.1 mmol/L (ref 3.5–5.2)
Sodium: 137 mmol/L (ref 134–144)
Total Protein: 8 g/dL (ref 6.0–8.5)
eGFR: 112 mL/min/{1.73_m2} (ref >59)

## 2020-10-21 LAB — HEMOGLOBIN A1C
Est. average glucose Bld gHb Est-mCnc: 103 mg/dL
Hgb A1c MFr Bld: 5.2 % (ref 4.8–5.6)

## 2020-10-21 LAB — LIPID PANEL W/O CHOL/HDL RATIO
Cholesterol, Total: 177 mg/dL (ref 100–199)
HDL: 54 mg/dL (ref >39)
LDL Chol Calc (NIH): 98 mg/dL (ref 0–99)
Triglycerides: 144 mg/dL (ref 0–149)
VLDL Cholesterol Cal: 25 mg/dL (ref 5–40)

## 2020-10-21 LAB — AMYLASE: Amylase: 58 U/L (ref 31–110)

## 2020-10-21 LAB — LIPASE: Lipase: 36 U/L (ref 14–72)

## 2020-10-21 NOTE — Progress Notes (Signed)
Contacted via MyChart   Good morning Shirley Rollins, your labs have returned and overall look fantastic.  Your cholesterol labs are even much improved this check.  Overall no concerns on these.  If you have any questions, please let me know:) Keep being amazing!!  Thank you for allowing me to participate in your care.  I appreciate you. Kindest regards, Gwendolen Hewlett

## 2020-10-23 LAB — DRUG SCREEN 764883 11+OXYCO+ALC+CRT-BUND
BENZODIAZ UR QL: NEGATIVE ng/mL
Barbiturate: NEGATIVE ng/mL
Cannabinoid Quant, Ur: NEGATIVE ng/mL
Cocaine (Metabolite): NEGATIVE ng/mL
Creatinine: 13.4 mg/dL — ABNORMAL LOW (ref 20.0–300.0)
Ethanol: NEGATIVE %
Meperidine: NEGATIVE ng/mL
Methadone Screen, Urine: NEGATIVE ng/mL
OPIATE SCREEN URINE: NEGATIVE ng/mL
Oxycodone/Oxymorphone, Urine: NEGATIVE ng/mL
Phencyclidine: NEGATIVE ng/mL
Propoxyphene: NEGATIVE ng/mL
Tramadol: NEGATIVE ng/mL
pH, Urine: 6.1 (ref 4.5–8.9)

## 2020-10-23 LAB — SPECIFIC GRAVITY: Specific Gravity: 1.0032

## 2020-10-23 LAB — DRUG PROFILE 799016
Amphetamine GC/MS Conf: >3000 ng/mL
Amphetamine: POSITIVE — AB
Amphetamines: POSITIVE — AB
Methamphetamine: NEGATIVE

## 2020-11-01 ENCOUNTER — Ambulatory Visit
Admission: RE | Admit: 2020-11-01 | Discharge: 2020-11-01 | Disposition: A | Discharge status: Home / Self Care | Payer: No Typology Code available for payment source | Source: Ambulatory Visit | Attending: Nurse Practitioner | Admitting: Nurse Practitioner

## 2020-11-01 ENCOUNTER — Other Ambulatory Visit: Payer: Self-pay

## 2020-11-01 DIAGNOSIS — Z1231 Encounter for screening mammogram for malignant neoplasm of breast: Secondary | ICD-10-CM | POA: Insufficient documentation

## 2020-11-02 NOTE — Progress Notes (Signed)
Contacted via MyChart   Normal mammogram, repeat in one year.:)

## 2020-11-21 ENCOUNTER — Other Ambulatory Visit (HOSPITAL_COMMUNITY): Payer: Self-pay

## 2020-12-26 ENCOUNTER — Ambulatory Visit: Payer: Self-pay | Admitting: *Deleted

## 2020-12-26 NOTE — Telephone Encounter (Signed)
Reason for Disposition  Depression interferes with sleep  Answer Assessment - Initial Assessment Questions 1. CONCERN: "What happened that made you call today?" "What is your main question or concern?"     I've been feeling anxious for months and I just can't seem to shake it.   Lack of motivation, no energy.   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  I'm not suicidal. 2. RISK OF HARM - SUICIDAL ATTEMPT or THOUGHTS "Have you ever tried to hurt yourself?" If yes, "When was that?"  "Do you ever have thoughts of hurting yourself?"      I'm not suicidal.   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  My mother died from cancer and that anniversary is coming up. 3. ONSET: "When did the sadness or depression begin?"     Several months 4. EVENTS AND STRESSORS: "Has there been any recent changes, new stressors, pressures, or upsetting events in your life?" (e.g., recent loss of loved one, etc)     I'm finally able to open up and talk with my friend and I know I need help. 5. FUNCTIONAL IMPAIRMENT: "How have things been going at home and at school (or work)? (same, better, or worse). "Are your sad feelings keeping you from doing any of your normal daily activities?" (such as school, work, friendships, teams, clubs)     My house is a Product [employer].   I usually keep my house clean.  I'm not motivated to do anything.   I'm tired all the time.   I'm not sleeping well.    6. RECURRENT SYMPTOMS: "Have you ever been this sad or depressed before?" If so, ask: "When was the last time?" and "What happened that time?"     Yes 4 yrs ago after losing my Mother to cancer.  I don't want to be around people. 7. THERAPIST: "Do you have a counselor or therapist? Name?"     I was seeing someone but it's been a while.   I do want to establish with someone though. 8. PARENT QUESTION - TEEN'S APPEARANCE: "How does your teen look?" "What are they doing right now?"     N/A   Note to Triager: It's better to speak to the older child or teen  directly for these calls.  Protocols used: Depression-P-AH

## 2020-12-26 NOTE — Telephone Encounter (Signed)
Pt called in c/o feeling lack of motivation and tired all the time.   Denies being suicidal.   She's been feeling anxious for several months now.  Not sleeping well either.   See triage notes.  I called into [facility] and spoke with Jon Gills because there were no appts with Aura Dials until the end of Nov.  Jon Gills is able to schedule her sooner so I connected the pt to [facility] via warm transfer.   I instructed the pt to call us back if her symptoms became worse between now and her appt.   She was agreeable and thanked me for my help.   I disconnected from the line at this point.  I sent my notes to [facility].

## 2020-12-28 ENCOUNTER — Telehealth (INDEPENDENT_AMBULATORY_CARE_PROVIDER_SITE_OTHER): Payer: No Typology Code available for payment source | Admitting: Nurse Practitioner

## 2020-12-28 ENCOUNTER — Other Ambulatory Visit (HOSPITAL_COMMUNITY): Payer: Self-pay

## 2020-12-28 ENCOUNTER — Encounter: Payer: Self-pay | Admitting: Nurse Practitioner

## 2020-12-28 DIAGNOSIS — F32A Depression, unspecified: Secondary | ICD-10-CM

## 2020-12-28 DIAGNOSIS — F988 Other specified behavioral and emotional disorders with onset usually occurring in childhood and adolescence: Secondary | ICD-10-CM | POA: Diagnosis not present

## 2020-12-28 DIAGNOSIS — F419 Anxiety disorder, unspecified: Secondary | ICD-10-CM | POA: Insufficient documentation

## 2020-12-28 MED ORDER — AMPHETAMINE-DEXTROAMPHET ER 25 MG PO CP24
25.0000 mg | ORAL_CAPSULE | ORAL | 0 refills | Status: DC
  Filled 2020-12-28: qty 30, 30d supply, fill #0

## 2020-12-28 MED ORDER — VENLAFAXINE HCL ER 37.5 MG PO CP24
37.5000 mg | ORAL_CAPSULE | Freq: Every day | ORAL | 5 refills | Status: DC
  Filled 2020-12-28: qty 45, 45d supply, fill #0
  Filled 2021-02-10: qty 45, 45d supply, fill #1
  Filled 2021-03-27: qty 45, 45d supply, fill #2

## 2020-12-28 NOTE — Assessment & Plan Note (Addendum)
Chronic, ongoing.  UDS and controlled substance contract up to date.  Will send 30 day supply today.  She agrees to every 3 month visits, can be virtual if needed.  Her goal in long run is to transition to Effexor only if possible and come off Adderall, discussed this at length with her and recommend no cold [place] stopping but can work on gradual reduction over time with addition of Effexor.

## 2020-12-28 NOTE — Assessment & Plan Note (Signed)
Present for years with recent onset of worsening, ?related to perimenopause with hormone shifts.  At this time denies SI/HI.  Educated on both SSRI and SNRI treatment regimens, which offer benefit for both anxiety and depression.  Her wish is to trial Effexor as she has read about this online for ADD.  Will start Effexor 37.5 MG daily, script sent, and adjust as needed based on tolerance and mood.  Her goal in long run is to transition to Effexor only if possible and come off Adderall, discussed this at length with her and recommend no cold [place] stopping but can work on gradual reduction over time with addition of Effexor.  Return in 5 weeks for follow-up.

## 2020-12-28 NOTE — Patient Instructions (Signed)

## 2020-12-28 NOTE — Progress Notes (Signed)
There were no vitals taken for this visit.   Subjective:    Patient ID: Shirley Rollins, female    DOB: 08/16/1980, 40 y.o.   MRN: [id number]  HPI: Shirley Rollins is a 40 y.o. female  Chief Complaint  Patient presents with   Depression   This visit was completed via video visit through [place] due to the restrictions of the COVID-19 pandemic. All issues as above were discussed and addressed. Physical exam was done as above through visual confirmation on video through MyChart. If it was felt that the patient should be evaluated in the office, they were directed there. The patient verbally consented to this visit. Location of the patient: home Location of the provider: work Those involved with this call:  Provider: Marnee Guarneri, DNP CMA: Irena Reichmann, [place] Desk/Registration: [employer]  Time spent on call:  21 minutes with patient face to face via video conference. More than 50% of this time was spent in counseling and coordination of care. 15 minutes total spent in review of patient's record and preparation of their chart.  I verified patient identity using two factors (patient name and date of birth). Patient consents verbally to being seen via telemedicine visit today.    ADD She continues on Adderall XR 25 MG daily.  She been on this regimen for 8 years and overall works well.  Last fill on PDMP review 11/21/20. Status: stable Satisfied with current therapy: yes Medication compliance:  good compliance Controlled substance contract: yes Previous psychiatry evaluation: no Previous medications: yes adderall XR   Taking meds on weekends/vacations: yes Work/school performance:  good Difficulty sustaining attention/completing tasks:  occasional Distracted by extraneous stimuli: no Does not listen when spoken to: no  Fidgets with hands or feet: no Unable to stay in seat: no Blurts out/interrupts others: no ADHD Medication Side Effects: no    Decreased appetite:  no    Headache: no    Sleeping disturbance pattern: no    Irritability: no    Rebound effects (worse than baseline) off medication: no    Anxiousness: no    Dizziness: no    Tics: no   ANXIETY/STRESS Having ongoing stress and anxiety for awhile now, has always struggled with this but has been doing daily affirmations.  Does not feel suicidal.  Does not feel like walking dog or doing the things she enjoys.  Is not sure how old her mother or grandmother were when they went through menopause.    She would like to try and SNRI, as her goal would be to take only one medication in future -- taking Effexor instead of Adderall for mood and ADD.  She had read about this. Duration:uncontrolled Anxious mood: yes  Excessive worrying: yes Irritability: yes  Sweating: no Nausea: no Palpitations:no Hyperventilation: no Panic attacks:  years ago Agoraphobia: no  Obscessions/compulsions: no Depressed mood: yes Depression screen [facility] 2/9 12/28/2020 10/20/2020 04/18/2020 10/19/2019 10/13/2018  Decreased Interest 3 0 0 1 0  Down, Depressed, Hopeless 3 0 1 1 1   PHQ - 2 Score 6 0 1 2 1   Altered sleeping 2 - 3 0 0  Tired, decreased energy 3 - 3 2 2   Change in appetite 2 - 0 0 0  Feeling bad or failure about yourself  3 - 1 0 1  Trouble concentrating 2 - 1 3 2   Moving slowly or fidgety/restless 0 - 1 0 2  Suicidal thoughts 0 - 0 0 0  PHQ-9 Score 18 -  10 7 8   Difficult doing work/chores Extremely dIfficult - Not difficult at all Very difficult Not difficult at all   Anhedonia: yes Weight changes: no Insomnia: yes hard to stay asleep  Hypersomnia: no Fatigue/loss of energy: yes Feelings of worthlessness: yes Feelings of guilt: yes Impaired concentration/indecisiveness: yes Suicidal ideations: no  Crying spells: yes Recent Stressors/Life Changes: yes   Relationship problems: yes   Family stress: no     Financial stress: yes    Job stress: yes    Recent death/loss: no  GAD 7 : Generalized  Anxiety Score 12/28/2020  Nervous, Anxious, on Edge 3  Control/stop worrying 3  Worry too much - different things 3  Trouble relaxing 2  Restless 0  Easily annoyed or irritable 2  Afraid - awful might happen 3  Total GAD 7 Score 16  Anxiety Difficulty Extremely difficult   Relevant past medical, surgical, family and social history reviewed and updated as indicated. Interim medical history since our last visit reviewed. Allergies and medications reviewed and updated.  Review of Systems  Constitutional:  Negative for activity change, appetite change, diaphoresis, fatigue and fever.  Respiratory:  Negative for cough, chest tightness and shortness of breath.   Cardiovascular:  Negative for chest pain, palpitations and leg swelling.  Gastrointestinal: Negative.   Neurological: Negative.   Psychiatric/Behavioral:  Positive for decreased concentration and sleep disturbance. Negative for self-injury and suicidal ideas. The patient is nervous/anxious.    Per HPI unless specifically indicated above     Objective:    There were no vitals taken for this visit.  Wt Readings from Last 3 Encounters:  10/11/20 118 lb 12.8 oz (53.9 kg)  04/18/20 117 lb (53.1 kg)  10/19/19 118 lb (53.5 kg)    Physical Exam Vitals and nursing note reviewed.  Constitutional:      General: She is awake. She is not in acute distress.    Appearance: She is well-developed. She is not ill-appearing.  HENT:     Head: Normocephalic.     Right Ear: Hearing normal.     Left Ear: Hearing normal.  Eyes:     General: Lids are normal.        Right eye: No discharge.        Left eye: No discharge.     Conjunctiva/sclera: Conjunctivae normal.  Pulmonary:     Effort: Pulmonary effort is normal. No accessory muscle usage or respiratory distress.  Musculoskeletal:     Cervical back: Normal range of motion.  Neurological:     Mental Status: She is alert and oriented to person, place, and time.  Psychiatric:         Attention and Perception: Attention normal.        Mood and Affect: Mood normal. Affect is tearful.        Behavior: Behavior normal. Behavior is cooperative.        Thought Content: Thought content normal.        Judgment: Judgment normal.   Results for orders placed or performed in visit on 10/20/20  Comprehensive metabolic panel  Result Value Ref Range   Glucose 95 65 - 99 mg/dL   BUN 7 6 - 24 mg/dL   Creatinine, Ser 0.67 0.57 - 1.00 mg/dL   eGFR 112 >59 mL/min/1.73   BUN/Creatinine Ratio 10 9 - 23   Sodium 137 134 - 144 mmol/L   Potassium 4.1 3.5 - 5.2 mmol/L   Chloride 99 96 - 106 mmol/L   CO2  22 20 - 29 mmol/L   Calcium 10.0 8.7 - 10.2 mg/dL   Total Protein 8.0 6.0 - 8.5 g/dL   Albumin 5.2 (H) 3.8 - 4.8 g/dL   Globulin, Total 2.8 1.5 - 4.5 g/dL   Albumin/Globulin Ratio 1.9 1.2 - 2.2   Bilirubin Total 0.6 0.0 - 1.2 mg/dL   Alkaline Phosphatase 42 (L) 44 - 121 IU/L   AST 22 0 - 40 IU/L   ALT 17 0 - 32 IU/L  CBC with Differential/Platelet  Result Value Ref Range   WBC 6.5 3.4 - 10.8 x10E3/uL   RBC 4.45 3.77 - 5.28 x10E6/uL   Hemoglobin 14.1 11.1 - 15.9 g/dL   Hematocrit 42.2 34.0 - 46.6 %   MCV 95 79 - 97 fL   MCH 31.7 26.6 - 33.0 pg   MCHC 33.4 31.5 - 35.7 g/dL   RDW 11.4 (L) 11.7 - 15.4 %   Platelets 321 150 - 450 x10E3/uL   Neutrophils 60 Not Estab. %   Lymphs 30 Not Estab. %   Monocytes 8 Not Estab. %   Eos 1 Not Estab. %   Basos 1 Not Estab. %   Neutrophils Absolute 3.9 1.4 - 7.0 x10E3/uL   Lymphocytes Absolute 2.0 0.7 - 3.1 x10E3/uL   Monocytes Absolute 0.5 0.1 - 0.9 x10E3/uL   EOS (ABSOLUTE) 0.1 0.0 - 0.4 x10E3/uL   Basophils Absolute 0.1 0.0 - 0.2 x10E3/uL   Immature Granulocytes 0 Not Estab. %   Immature Grans (Abs) 0.0 0.0 - 0.1 x10E3/uL  Lipid Panel w/o Chol/HDL Ratio  Result Value Ref Range   Cholesterol, Total 177 100 - 199 mg/dL   Triglycerides 144 0 - 149 mg/dL   HDL 54 >39 mg/dL   VLDL Cholesterol Cal 25 5 - 40 mg/dL   LDL Chol Calc (NIH) 98  0 - 99 mg/dL  TSH  Result Value Ref Range   TSH 2.600 0.450 - 4.500 uIU/mL  HgB A1c  Result Value Ref Range   Hgb A1c MFr Bld 5.2 4.8 - 5.6 %   Est. average glucose Bld gHb Est-mCnc 103 mg/dL  Lipase  Result Value Ref Range   Lipase 36 14 - 72 U/L  Amylase  Result Value Ref Range   Amylase 58 31 - 110 U/L  [id number] 11+Oxyco+Alc+Crt-Bund  Result Value Ref Range   Ethanol Negative Cutoff=0.020 %   Amphetamines, Urine See Final Results Cutoff=1000 ng/mL   Barbiturate Negative Cutoff=200 ng/mL   BENZODIAZ UR QL Negative Cutoff=200 ng/mL   Cannabinoid Quant, Ur Negative Cutoff=50 ng/mL   Cocaine (Metabolite) Negative Cutoff=300 ng/mL   OPIATE SCREEN URINE Negative Cutoff=300 ng/mL   Oxycodone/Oxymorphone, Urine Negative Cutoff=300 ng/mL   Phencyclidine Negative Cutoff=25 ng/mL   Methadone Screen, Urine Negative Cutoff=300 ng/mL   Propoxyphene Negative Cutoff=300 ng/mL   Meperidine Negative Cutoff=200 ng/mL   Tramadol Negative Cutoff=200 ng/mL   Creatinine 13.4 (L) 20.0 - 300.0 mg/dL   pH, Urine 6.1 4.5 - 8.9  Drug Profile [phone]  Result Value Ref Range   Amphetamines Positive (A) Cutoff=1000   Amphetamine Positive (A)    Amphetamine GC/MS Conf >3000 Cutoff=500 ng/mL   Methamphetamine Negative Cutoff=500  Specific Gravity  Result Value Ref Range   Specific Gravity 1.0032       Assessment & Plan:   Problem List Items Addressed This Visit       Other   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  UDS and controlled substance contract up  to date.  Will send 30 day supply today.  She agrees to every 3 month visits, can be virtual if needed.  Her goal in long run is to transition to Effexor only if possible and come off Adderall, discussed this at length with her and recommend no cold [place] stopping but can work on gradual reduction over time with addition of Effexor.      Anxiety and depression    Present for years with recent onset of  worsening, ?related to perimenopause with hormone shifts.  At this time denies SI/HI.  Educated on both SSRI and SNRI treatment regimens, which offer benefit for both anxiety and depression.  Her wish is to trial Effexor as she has read about this online for ADD.  Will start Effexor 37.5 MG daily, script sent, and adjust as needed based on tolerance and mood.  Her goal in long run is to transition to Effexor only if possible and come off Adderall, discussed this at length with her and recommend no cold [place] stopping but can work on gradual reduction over time with addition of Effexor.  Return in 5 weeks for follow-up.      Relevant Medications   venlafaxine XR (EFFEXOR-XR) 37.5 MG 24 hr capsule    I discussed the assessment and treatment plan with the patient. The patient was provided an opportunity to ask questions and all were answered. The patient agreed with the plan and demonstrated an understanding of the instructions.   The patient was advised to call back or seek an in-person evaluation if the symptoms worsen or if the condition fails to improve as anticipated.   I provided 21+ minutes of time during this encounter.   Follow up plan: Return in about 5 weeks (around 02/01/2021) for ANXIETY  AND ADD.

## 2020-12-29 ENCOUNTER — Other Ambulatory Visit (HOSPITAL_COMMUNITY): Payer: Self-pay

## 2020-12-29 NOTE — Progress Notes (Signed)
Appointment has been made

## 2021-01-23 ENCOUNTER — Telehealth: Payer: No Typology Code available for payment source | Admitting: Nurse Practitioner

## 2021-01-31 ENCOUNTER — Other Ambulatory Visit (HOSPITAL_COMMUNITY): Payer: Self-pay

## 2021-01-31 ENCOUNTER — Other Ambulatory Visit: Payer: Self-pay | Admitting: Nurse Practitioner

## 2021-01-31 NOTE — Telephone Encounter (Signed)
Requested medication (s) are due for refill today - yes  Requested medication (s) are on the active medication list -yes  Future visit scheduled -yes  Last refill: 12/28/20 330  Notes to clinic: Request RF: non delegated Rx  Requested Prescriptions  Pending Prescriptions Disp Refills   amphetamine-dextroamphetamine (ADDERALL XR) 25 MG 24 hr capsule 30 capsule 0    Sig: Take 1 capsule by mouth every morning.     Not Delegated - Psychiatry:  Stimulants/ADHD Failed - 01/31/2021  1:31 PM      Failed - This refill cannot be delegated      Passed - Urine Drug Screen completed in last 360 days      Passed - Valid encounter within last 3 months    Recent Outpatient Visits           1 month ago Anxiety and depression   [facility] [place], Jolene T, NP   3 months ago ADD (attention deficit disorder) without hyperactivity   [facility] [place], Corrie Dandy T, NP   3 months ago Encounter to establish care   [facility] [place], Corrie Dandy T, NP   9 months ago ADD (attention deficit disorder) without hyperactivity   [facility] [place], Alessandra Bevels, [name]   1 year ago Annual physical exam   [facility], Alessandra Bevels, [place]       Future Appointments             Tomorrow Marjie Skiff, NP [facility]               Requested Prescriptions  Pending Prescriptions Disp Refills   amphetamine-dextroamphetamine (ADDERALL XR) 25 MG 24 hr capsule 30 capsule 0    Sig: Take 1 capsule by mouth every morning.     Not Delegated - Psychiatry:  Stimulants/ADHD Failed - 01/31/2021  1:31 PM      Failed - This refill cannot be delegated      Passed - Urine Drug Screen completed in last 360 days      Passed - Valid encounter within last 3 months    Recent Outpatient Visits           1 month ago Anxiety and depression   [facility] [place], Jolene T, NP   3 months ago ADD (attention  deficit disorder) without hyperactivity   [facility] [place], Dorie Rank, NP   3 months ago Encounter to establish care   [facility] [place] [place], Corrie Dandy T, NP   9 months ago ADD (attention deficit disorder) without hyperactivity   [facility] [place], Alessandra Bevels, [name]   1 year ago Annual physical exam   [facility], Alessandra Bevels, [place]       Future Appointments             Tomorrow [place], Dorie Rank, NP [facility]

## 2021-02-01 ENCOUNTER — Other Ambulatory Visit (HOSPITAL_COMMUNITY): Payer: Self-pay

## 2021-02-01 ENCOUNTER — Encounter: Payer: Self-pay | Admitting: Nurse Practitioner

## 2021-02-01 ENCOUNTER — Telehealth (INDEPENDENT_AMBULATORY_CARE_PROVIDER_SITE_OTHER): Payer: No Typology Code available for payment source | Admitting: Nurse Practitioner

## 2021-02-01 DIAGNOSIS — F32A Depression, unspecified: Secondary | ICD-10-CM

## 2021-02-01 DIAGNOSIS — F988 Other specified behavioral and emotional disorders with onset usually occurring in childhood and adolescence: Secondary | ICD-10-CM | POA: Diagnosis not present

## 2021-02-01 DIAGNOSIS — F419 Anxiety disorder, unspecified: Secondary | ICD-10-CM | POA: Diagnosis not present

## 2021-02-01 MED ORDER — AMPHETAMINE-DEXTROAMPHET ER 25 MG PO CP24
25.0000 mg | ORAL_CAPSULE | ORAL | 0 refills | Status: DC
  Filled 2021-02-01: qty 30, 30d supply, fill #0

## 2021-02-01 MED ORDER — AMPHETAMINE-DEXTROAMPHET ER 25 MG PO CP24
25.0000 mg | ORAL_CAPSULE | ORAL | 0 refills | Status: DC
  Filled 2021-04-06: qty 30, 30d supply, fill #0

## 2021-02-01 MED ORDER — AMPHETAMINE-DEXTROAMPHET ER 25 MG PO CP24
ORAL_CAPSULE | Freq: Every morning | ORAL | 0 refills | Status: DC
  Filled 2021-03-06: qty 30, 30d supply, fill #0

## 2021-02-01 NOTE — Progress Notes (Signed)
There were no vitals taken for this visit.   Subjective:    Patient ID: Shirley Rollins, female    DOB: 11-26-1980, 40 y.o.   MRN: [id number]  HPI: Shirley Rollins is a 40 y.o. female  Chief Complaint  Patient presents with   ADD   Medication Reaction    Patient states she has seen some improvement, but still has some days.    This visit was completed via video visit through MyChart due to the restrictions of the COVID-19 pandemic. All issues as above were discussed and addressed. Physical exam was done as above through visual confirmation on video through MyChart. If it was felt that the patient should be evaluated in the office, they were directed there. The patient verbally consented to this visit. Location of the patient: home Location of the provider: work Those involved with this call:  Provider: Marnee Guarneri, DNP CMA: Irena Reichmann, [place] Desk/Registration: [employer]  Time spent on call:  21 minutes with patient face to face via video conference. More than 50% of this time was spent in counseling and coordination of care. 15 minutes total spent in review of patient's record and preparation of their chart.  I verified patient identity using two factors (patient name and date of birth). Patient consents verbally to being seen via telemedicine visit today.     ANXIETY/STRESS Started on Effexor 37.5 MG last visit due to stressors in life causing increased anxiety -- the first day she had waves of nausea with this + wobbly legs -- this has all gone away now.  Her goal would be to take only one medication in future -- taking Effexor instead of Adderall for mood and ADD.  She has read about this. Duration: stable Anxious mood: improving Excessive worrying: improving Irritability: improving Sweating: no Nausea: no Palpitations:no Hyperventilation: no Panic attacks: none Agoraphobia: no  Obscessions/compulsions: no Depressed mood: improved Depression screen [facility]  2/9 02/01/2021 12/28/2020 10/20/2020 04/18/2020 10/19/2019  Decreased Interest 1 3 0 0 1  Down, Depressed, Hopeless 2 3 0 1 1  PHQ - 2 Score 3 6 0 1 2  Altered sleeping 2 2 - 3 0  Tired, decreased energy 2 3 - 3 2  Change in appetite 0 2 - 0 0  Feeling bad or failure about yourself  1 3 - 1 0  Trouble concentrating 1 2 - 1 3  Moving slowly or fidgety/restless 0 0 - 1 0  Suicidal thoughts 0 0 - 0 0  PHQ-9 Score 9 18 - 10 7  Difficult doing work/chores Somewhat difficult Extremely dIfficult - Not difficult at all Very difficult   Anhedonia: improved Weight changes: no Insomnia: yes hard to stay asleep  Hypersomnia: no Fatigue/loss of energy: none Feelings of worthlessness: none Feelings of guilt: none Impaired concentration/indecisiveness: none Suicidal ideations: no  Crying spells: yes Recent Stressors/Life Changes: yes   Relationship problems: yes   Family stress: no     Financial stress: none   Job stress: yes    Recent death/loss: no  GAD 7 : Generalized Anxiety Score 02/01/2021 12/28/2020  Nervous, Anxious, on Edge 2 3  Control/stop worrying 1 3  Worry too much - different things 2 3  Trouble relaxing 1 2  Restless 1 0  Easily annoyed or irritable 1 2  Afraid - awful might happen 0 3  Total GAD 7 Score 8 16  Anxiety Difficulty Somewhat difficult Extremely difficult    ADD She continues on Adderall  XR 25 MG daily.  She been on this regimen for 8 years and overall works well.  Last fill on PDMP review 12/29/20. Status: stable Satisfied with current therapy: yes Medication compliance:  good compliance Controlled substance contract: yes Previous psychiatry evaluation: no Previous medications: yes adderall XR   Taking meds on weekends/vacations: yes Work/school performance:  good Difficulty sustaining attention/completing tasks:  occasional Distracted by extraneous stimuli: no Does not listen when spoken to: no  Fidgets with hands or feet: no Unable to stay in seat:  no Blurts out/interrupts others: no ADHD Medication Side Effects: no    Decreased appetite: no    Headache: no    Sleeping disturbance pattern: no    Irritability: no    Rebound effects (worse than baseline) off medication: no    Anxiousness: no    Dizziness: no    Tics: no  Relevant past medical, surgical, family and social history reviewed and updated as indicated. Interim medical history since our last visit reviewed. Allergies and medications reviewed and updated.  Review of Systems  Constitutional:  Negative for activity change, appetite change, diaphoresis, fatigue and fever.  Respiratory:  Negative for cough, chest tightness and shortness of breath.   Cardiovascular:  Negative for chest pain, palpitations and leg swelling.  Gastrointestinal: Negative.   Neurological: Negative.   Psychiatric/Behavioral:  Positive for sleep disturbance. Negative for decreased concentration, self-injury and suicidal ideas. The patient is not nervous/anxious.    Per HPI unless specifically indicated above     Objective:    There were no vitals taken for this visit.  Wt Readings from Last 3 Encounters:  10/11/20 118 lb 12.8 oz (53.9 kg)  04/18/20 117 lb (53.1 kg)  10/19/19 118 lb (53.5 kg)    Physical Exam Vitals and nursing note reviewed.  Constitutional:      General: She is awake. She is not in acute distress.    Appearance: She is well-developed. She is not ill-appearing.  HENT:     Head: Normocephalic.     Right Ear: Hearing normal.     Left Ear: Hearing normal.  Eyes:     General: Lids are normal.        Right eye: No discharge.        Left eye: No discharge.     Conjunctiva/sclera: Conjunctivae normal.  Pulmonary:     Effort: Pulmonary effort is normal. No accessory muscle usage or respiratory distress.  Musculoskeletal:     Cervical back: Normal range of motion.  Neurological:     Mental Status: She is alert and oriented to person, place, and time.  Psychiatric:         Attention and Perception: Attention normal.        Mood and Affect: Mood normal.        Behavior: Behavior normal. Behavior is cooperative.        Thought Content: Thought content normal.        Judgment: Judgment normal.   Results for orders placed or performed in visit on 10/20/20  Comprehensive metabolic panel  Result Value Ref Range   Glucose 95 65 - 99 mg/dL   BUN 7 6 - 24 mg/dL   Creatinine, Ser 0.67 0.57 - 1.00 mg/dL   eGFR 112 >59 mL/min/1.73   BUN/Creatinine Ratio 10 9 - 23   Sodium 137 134 - 144 mmol/L   Potassium 4.1 3.5 - 5.2 mmol/L   Chloride 99 96 - 106 mmol/L   CO2 22 20 -  29 mmol/L   Calcium 10.0 8.7 - 10.2 mg/dL   Total Protein 8.0 6.0 - 8.5 g/dL   Albumin 5.2 (H) 3.8 - 4.8 g/dL   Globulin, Total 2.8 1.5 - 4.5 g/dL   Albumin/Globulin Ratio 1.9 1.2 - 2.2   Bilirubin Total 0.6 0.0 - 1.2 mg/dL   Alkaline Phosphatase 42 (L) 44 - 121 IU/L   AST 22 0 - 40 IU/L   ALT 17 0 - 32 IU/L  CBC with Differential/Platelet  Result Value Ref Range   WBC 6.5 3.4 - 10.8 x10E3/uL   RBC 4.45 3.77 - 5.28 x10E6/uL   Hemoglobin 14.1 11.1 - 15.9 g/dL   Hematocrit 42.2 34.0 - 46.6 %   MCV 95 79 - 97 fL   MCH 31.7 26.6 - 33.0 pg   MCHC 33.4 31.5 - 35.7 g/dL   RDW 11.4 (L) 11.7 - 15.4 %   Platelets 321 150 - 450 x10E3/uL   Neutrophils 60 Not Estab. %   Lymphs 30 Not Estab. %   Monocytes 8 Not Estab. %   Eos 1 Not Estab. %   Basos 1 Not Estab. %   Neutrophils Absolute 3.9 1.4 - 7.0 x10E3/uL   Lymphocytes Absolute 2.0 0.7 - 3.1 x10E3/uL   Monocytes Absolute 0.5 0.1 - 0.9 x10E3/uL   EOS (ABSOLUTE) 0.1 0.0 - 0.4 x10E3/uL   Basophils Absolute 0.1 0.0 - 0.2 x10E3/uL   Immature Granulocytes 0 Not Estab. %   Immature Grans (Abs) 0.0 0.0 - 0.1 x10E3/uL  Lipid Panel w/o Chol/HDL Ratio  Result Value Ref Range   Cholesterol, Total 177 100 - 199 mg/dL   Triglycerides 144 0 - 149 mg/dL   HDL 54 >39 mg/dL   VLDL Cholesterol Cal 25 5 - 40 mg/dL   LDL Chol Calc (NIH) 98 0 - 99 mg/dL  TSH   Result Value Ref Range   TSH 2.600 0.450 - 4.500 uIU/mL  HgB A1c  Result Value Ref Range   Hgb A1c MFr Bld 5.2 4.8 - 5.6 %   Est. average glucose Bld gHb Est-mCnc 103 mg/dL  Lipase  Result Value Ref Range   Lipase 36 14 - 72 U/L  Amylase  Result Value Ref Range   Amylase 58 31 - 110 U/L  [id number] 11+Oxyco+Alc+Crt-Bund  Result Value Ref Range   Ethanol Negative Cutoff=0.020 %   Amphetamines, Urine See Final Results Cutoff=1000 ng/mL   Barbiturate Negative Cutoff=200 ng/mL   BENZODIAZ UR QL Negative Cutoff=200 ng/mL   Cannabinoid Quant, Ur Negative Cutoff=50 ng/mL   Cocaine (Metabolite) Negative Cutoff=300 ng/mL   OPIATE SCREEN URINE Negative Cutoff=300 ng/mL   Oxycodone/Oxymorphone, Urine Negative Cutoff=300 ng/mL   Phencyclidine Negative Cutoff=25 ng/mL   Methadone Screen, Urine Negative Cutoff=300 ng/mL   Propoxyphene Negative Cutoff=300 ng/mL   Meperidine Negative Cutoff=200 ng/mL   Tramadol Negative Cutoff=200 ng/mL   Creatinine 13.4 (L) 20.0 - 300.0 mg/dL   pH, Urine 6.1 4.5 - 8.9  Drug Profile [phone]  Result Value Ref Range   Amphetamines Positive (A) Cutoff=1000   Amphetamine Positive (A)    Amphetamine GC/MS Conf >3000 Cutoff=500 ng/mL   Methamphetamine Negative Cutoff=500  Specific Gravity  Result Value Ref Range   Specific Gravity 1.0032       Assessment & Plan:   Problem List Items Addressed This Visit       Other   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  UDS and controlled substance contract up to  date.  Will send 30 day supply + 2 other 30 day supplies predated for future.  She agrees to every 3 month visits, can be virtual if needed.  Her goal in long run is to transition to Effexor only if possible and come off Adderall, discussed this at length with her and recommend no cold [place] stopping but can work on gradual reduction over time with addition of Effexor.      Relevant Medications    amphetamine-dextroamphetamine (ADDERALL XR) 25 MG 24 hr capsule (Start on 03/03/2021)   Anxiety and depression    Improving at this time with both PHQ9 and GAD showing reduction by 1/2.  At this time denies SI/HI.  Educated on both SSRI and SNRI treatment regimens, which offer benefit for both anxiety and depression.  Continue Effexor at this time and may increase to 50 MG in future if patient wishes to trial a slight increase.  Her goal in long run is to transition to Effexor only if possible and come off Adderall, discussed this at length with her and recommend no cold [place] stopping but can work on gradual reduction over time with addition of Effexor.  Return in 3 months.       I discussed the assessment and treatment plan with the patient. The patient was provided an opportunity to ask questions and all were answered. The patient agreed with the plan and demonstrated an understanding of the instructions.   The patient was advised to call back or seek an in-person evaluation if the symptoms worsen or if the condition fails to improve as anticipated.   I provided 21+ minutes of time during this encounter.   Follow up plan: Return in about 3 months (around 05/02/2021) for ADHD and ANXIETY.

## 2021-02-01 NOTE — Assessment & Plan Note (Signed)
Improving at this time with both PHQ9 and GAD showing reduction by 1/2.  At this time denies SI/HI.  Educated on both SSRI and SNRI treatment regimens, which offer benefit for both anxiety and depression.  Continue Effexor at this time and may increase to 50 MG in future if patient wishes to trial a slight increase.  Her goal in long run is to transition to Effexor only if possible and come off Adderall, discussed this at length with her and recommend no cold [place] stopping but can work on gradual reduction over time with addition of Effexor.  Return in 3 months.

## 2021-02-01 NOTE — Patient Instructions (Signed)
Managing Anxiety, Adult ?After being diagnosed with anxiety, you may be relieved to know why you have felt or behaved a certain way. You may also feel overwhelmed about the treatment ahead and what it will mean for your life. With care and support, you can manage this condition. ?How to manage lifestyle changes ?Managing stress and anxiety ?Stress is your body's reaction to life changes and events, both good and bad. Most stress will last just a few hours, but stress can be ongoing and can lead to more than just stress. Although stress can play a major role in anxiety, it is not the same as anxiety. Stress is usually caused by something external, such as a deadline, test, or competition. Stress normally passes after the triggering event has ended.  ?Anxiety is caused by something internal, such as imagining a terrible outcome or worrying that something will go wrong that will devastate you. Anxiety often does not go away even after the triggering event is over, and it can become long-term (chronic) worry. It is important to understand the differences between stress and anxiety and to manage your stress effectively so that it does not lead to an anxious response. ?Talk with your health care provider or a counselor to learn more about reducing anxiety and stress. He or she may suggest tension reduction techniques, such as: ?Music therapy. Spend time creating or listening to music that you enjoy and that inspires you. ?Mindfulness-based meditation. Practice being aware of your normal breaths while not trying to control your breathing. It can be done while sitting or walking. ?Centering prayer. This involves focusing on a word, phrase, or sacred image that means something to you and brings you peace. ?Deep breathing. To do this, expand your stomach and inhale slowly through your nose. Hold your breath for 3-5 seconds. Then exhale slowly, letting your stomach muscles relax. ?Self-talk. Learn to notice and identify  thought patterns that lead to anxiety reactions and change those patterns to thoughts that feel peaceful. ?Muscle relaxation. Taking time to tense muscles and then relax them. ?Choose a tension reduction technique that fits your lifestyle and personality. These techniques take time and practice. Set aside 5-15 minutes a day to do them. Therapists can offer counseling and training in these techniques. The training to help with anxiety may be covered by some insurance plans. ?Other things you can do to manage stress and anxiety include: ?Keeping a stress diary. This can help you learn what triggers your reaction and then learn ways to manage your response. ?Thinking about how you react to certain situations. You may not be able to control everything, but you can control your response. ?Making time for activities that help you relax and not feeling guilty about spending your time in this way. ?Doing visual imagery. This involves imagining or creating mental pictures to help you relax. ?Practicing yoga. Through yoga poses, you can lower tension and promote relaxation. ? ?Medicines ?Medicines can help ease symptoms. Medicines for anxiety include: ?Antidepressant medicines. These are usually prescribed for long-term daily control. ?Anti-anxiety medicines. These may be added in severe cases, especially when panic attacks occur. ?Medicines will be prescribed by a health care provider. When used together, medicines, psychotherapy, and tension reduction techniques may be the most effective treatment. ?Relationships ?Relationships can play a big part in helping you recover. Try to spend more time connecting with trusted friends and family members. ?Consider going to couples counseling if you have a partner, taking family education classes, or going to family   therapy. ?Therapy can help you and others better understand your condition. ?How to recognize changes in your anxiety ?Everyone responds differently to treatment for  anxiety. Recovery from anxiety happens when symptoms decrease and stop interfering with your daily activities at home or work. This may mean that you will start to: ?Have better concentration and focus. Worry will interfere less in your daily thinking. ?Sleep better. ?Be less irritable. ?Have more energy. ?Have improved memory. ?It is also important to recognize when your condition is getting worse. Contact your health care provider if your symptoms interfere with home or work and you feel like your condition is not improving. ?Follow these instructions at home: ?Activity ?Exercise. Adults should do the following: ?Exercise for at least 150 minutes each week. The exercise should increase your heart rate and make you sweat (moderate-intensity exercise). ?Strengthening exercises at least twice a week. ?Get the right amount and quality of sleep. Most adults need 7-9 hours of sleep each night. ?Lifestyle ? ?Eat a healthy diet that includes plenty of vegetables, fruits, whole grains, low-fat dairy products, and lean protein. ?Do not eat a lot of foods that are high in fats, added sugars, or salt (sodium). ?Make choices that simplify your life. ?Do not use any products that contain nicotine or tobacco. These products include cigarettes, chewing tobacco, and vaping devices, such as e-cigarettes. If you need help quitting, ask your health care provider. ?Avoid caffeine, alcohol, and certain over-the-counter cold medicines. These may make you feel worse. Ask your pharmacist which medicines to avoid. ?General instructions ?Take over-the-counter and prescription medicines only as told by your health care provider. ?Keep all follow-up visits. This is important. ?Where to find support ?You can get help and support from these sources: ?Self-help groups. ?Online and community organizations. ?A trusted spiritual leader. ?Couples counseling. ?Family education classes. ?Family therapy. ?Where to find more information ?You may find  that joining a support group helps you deal with your anxiety. The following sources can help you locate counselors or support groups near you: ?Mental Health America: www.mentalhealthamerica.net ?Anxiety and Depression Association of America (ADAA): www.adaa.org ?National Alliance on Mental Illness (NAMI): www.nami.org ?Contact a health care provider if: ?You have a hard time staying focused or finishing daily tasks. ?You spend many hours a day feeling worried about everyday life. ?You become exhausted by worry. ?You start to have headaches or frequently feel tense. ?You develop chronic nausea or diarrhea. ?Get help right away if: ?You have a racing heart and shortness of breath. ?You have thoughts of hurting yourself or others. ?If you ever feel like you may hurt yourself or others, or have thoughts about taking your own life, get help right away. Go to your nearest emergency department or: ?Call your local emergency services (911 in the [place] ?Call a suicide crisis helpline, such as the National Suicide Prevention Lifeline at 1-800-273-8255 or 988 in the [place] This is open 24 hours a day in the [place] ?Text the Crisis Text Line at 741741 (in the [place] ?Summary ?Taking steps to learn and use tension reduction techniques can help calm you and help prevent triggering an anxiety reaction. ?When used together, medicines, psychotherapy, and tension reduction techniques may be the most effective treatment. ?Family, friends, and partners can play a big part in supporting you. ?This information is not intended to replace advice given to you by your health care provider. Make sure you discuss any questions you have with your health care provider. ?Document Revised: 09/07/2020 Document Reviewed: 06/05/2020 ?Elsevier Patient   Education ? 2022 [employer] ? ?

## 2021-02-01 NOTE — Assessment & Plan Note (Signed)
Chronic, ongoing.  UDS and controlled substance contract up to date.  Will send 30 day supply + 2 other 30 day supplies predated for future.  She agrees to every 3 month visits, can be virtual if needed.  Her goal in long run is to transition to Effexor only if possible and come off Adderall, discussed this at length with her and recommend no cold [place] stopping but can work on gradual reduction over time with addition of Effexor.

## 2021-02-10 ENCOUNTER — Other Ambulatory Visit (HOSPITAL_COMMUNITY): Payer: Self-pay

## 2021-03-03 ENCOUNTER — Telehealth: Payer: No Typology Code available for payment source | Admitting: Physician Assistant

## 2021-03-03 ENCOUNTER — Other Ambulatory Visit: Payer: Self-pay

## 2021-03-03 DIAGNOSIS — H109 Unspecified conjunctivitis: Secondary | ICD-10-CM | POA: Diagnosis not present

## 2021-03-03 MED ORDER — POLYMYXIN B-TRIMETHOPRIM 10000-0.1 UNIT/ML-% OP SOLN
1.0000 [drp] | OPHTHALMIC | 0 refills | Status: DC
  Filled 2021-03-03: qty 10, 25d supply, fill #0

## 2021-03-03 NOTE — Progress Notes (Signed)

## 2021-03-06 ENCOUNTER — Other Ambulatory Visit (HOSPITAL_COMMUNITY): Payer: Self-pay

## 2021-03-27 ENCOUNTER — Other Ambulatory Visit (HOSPITAL_COMMUNITY): Payer: Self-pay

## 2021-04-06 ENCOUNTER — Other Ambulatory Visit (HOSPITAL_COMMUNITY): Payer: Self-pay

## 2021-04-30 NOTE — Patient Instructions (Signed)
Living With Attention Deficit Hyperactivity Disorder If you have been diagnosed with attention deficit hyperactivity disorder (ADHD), you may be relieved that you now know why you have felt or behaved a certain way. Still, you may feel overwhelmed about the treatment ahead. You may also wonder how to get the support you need and how to deal with the condition day-to-day. With treatment and support, you can live with ADHD and manage your symptoms. How to manage lifestyle changes Managing stress Stress is your body's reaction to life changes and events, both good and bad. To cope with the stress of an ADHD diagnosis, it may help to: Learn more about ADHD. Exercise regularly. Even a short daily walk can lower stress levels. Participate in training or education programs (including social skills training classes) that teach you to deal with symptoms.  Medicines Your health care provider may suggest certain medicines if he or she feels that they will help to improve your condition. Stimulant medicines are usually prescribed to treat ADHD, and therapy may also be prescribed. It is important to: Avoid using alcohol and other substances that may prevent your medicines from working properly (may interact). Talk with your pharmacist or health care provider about all the medicines that you take, their possible side effects, and what medicines are safe to take together. Make it your goal to take part in all treatment decisions (shared decision-making). Ask about possible side effects of medicines that your health care provider recommends, and tell him or her how you feel about having those side effects. It is best if shared decision-making with your health care provider is part of your total treatment plan. Relationships To strengthen your relationships with family members while treating your condition, consider taking part in family therapy. You might also attend self-help groups alone or with a loved one. Be  honest about how your symptoms affect your relationships. Make an effort to communicate respectfully instead of fighting, and find ways to show others that you care. Psychotherapy may be useful in helping you cope with how ADHD affects your relationships. How to recognize changes in your condition The following signs may mean that your treatment is working well and your condition is improving: Consistently being on time for appointments. Being more organized at home and work. Other people noticing improvements in your behavior. Achieving goals that you set for yourself. Thinking more clearly. The following signs may mean that your treatment is not working very well: Feeling impatience or more confusion. Missing, forgetting, or being late for appointments. An increasing sense of disorganization and messiness. More difficulty in reaching goals that you set for yourself. Loved ones becoming angry or frustrated with you. Follow these instructions at home: Take over-the-counter and prescription medicines only as told by your health care provider. Check with your health care provider before taking any new medicines. Create structure and an organized atmosphere at home. For example: Make a list of tasks, then rank them from most important to least important. Work on one task at a time until your listed tasks are done. Make a daily schedule and follow it consistently every day. Use an appointment calendar, and check it 2 or 3 times a day to keep on track. Keep it with you when you leave the house. Create spaces where you keep certain things, and always put things back in their places after you use them. Keep all follow-up visits as told by your health care provider. This is important. Where to find support Talking to others    Keep emotion out of important discussions and speak in a calm, logical way. Listen closely and patiently to your loved ones. Try to understand their point of view, and try to  avoid getting defensive. Take responsibility for the consequences of your actions. Ask that others do not take your behaviors personally. Aim to solve problems as they come up, and express your feelings instead of bottling them up. Talk openly about what you need from your loved ones and how they can support you. Consider going to family therapy sessions or having your family meet with a specialist who deals with ADHD-related behavior problems. Finances Not all insurance plans cover mental health care, so it is important to check with your insurance carrier. If paying for co-pays or counseling services is a problem, search for a local or county mental health care center. Public mental health care services may be offered there at a low cost or no cost when you are not able to see a private health care provider. If you are taking medicine for ADHD, you may be able to get the generic form, which may be less expensive than brand-name medicine. Some makers of prescription medicines also offer help to patients who cannot afford the medicines that they need. Questions to ask your health care provider: What are the risks and benefits of taking medicines? Would I benefit from therapy? How often should I follow up with a health care provider? Contact a health care provider if: You have side effects from your medicines, such as: Repeated muscle twitches, coughing, or speech outbursts. Sleep problems. Loss of appetite. Depression. New or worsening behavior problems. Dizziness. Unusually fast heartbeat. Stomach pains. Headaches. Get help right away if: You have a severe reaction to a medicine. Your behavior suddenly gets worse. Summary With treatment and support, you can live with ADHD and manage your symptoms. The medicines that are most often prescribed for ADHD are stimulants. Consider taking part in family therapy or self-help groups with family members or friends. When you talk with friends  and family about your ADHD, be patient and communicate openly. Take over-the-counter and prescription medicines only as told by your health care provider. Check with your health care provider before taking any new medicines. This information is not intended to replace advice given to you by your health care provider. Make sure you discuss any questions you have with your health care provider. Document Revised: 07/29/2019 Document Reviewed: 07/29/2019 Elsevier Patient Education  2022 Elsevier Inc.  

## 2021-05-03 ENCOUNTER — Other Ambulatory Visit (HOSPITAL_COMMUNITY): Payer: Self-pay

## 2021-05-03 ENCOUNTER — Encounter: Payer: Self-pay | Admitting: Nurse Practitioner

## 2021-05-03 ENCOUNTER — Telehealth (INDEPENDENT_AMBULATORY_CARE_PROVIDER_SITE_OTHER): Payer: No Typology Code available for payment source | Admitting: Nurse Practitioner

## 2021-05-03 VITALS — BP 110/86 | HR 80 | Wt 124.6 lb

## 2021-05-03 DIAGNOSIS — F419 Anxiety disorder, unspecified: Secondary | ICD-10-CM

## 2021-05-03 DIAGNOSIS — F988 Other specified behavioral and emotional disorders with onset usually occurring in childhood and adolescence: Secondary | ICD-10-CM

## 2021-05-03 DIAGNOSIS — F32A Depression, unspecified: Secondary | ICD-10-CM

## 2021-05-03 MED ORDER — AMPHETAMINE-DEXTROAMPHET ER 25 MG PO CP24
25.0000 mg | ORAL_CAPSULE | ORAL | 0 refills | Status: DC
  Filled 2021-06-03: qty 30, 30d supply, fill #0

## 2021-05-03 MED ORDER — AMPHETAMINE-DEXTROAMPHET ER 25 MG PO CP24
ORAL_CAPSULE | Freq: Every morning | ORAL | 0 refills | Status: DC
  Filled 2021-05-03: qty 30, 30d supply, fill #0

## 2021-05-03 MED ORDER — AMPHETAMINE-DEXTROAMPHET ER 25 MG PO CP24
25.0000 mg | ORAL_CAPSULE | ORAL | 0 refills | Status: DC
  Filled 2021-07-06: qty 30, 30d supply, fill #0

## 2021-05-03 MED ORDER — VENLAFAXINE HCL ER 75 MG PO CP24
75.0000 mg | ORAL_CAPSULE | Freq: Every day | ORAL | 4 refills | Status: DC
  Filled 2021-05-03 (×2): qty 90, 90d supply, fill #0
  Filled 2021-08-03: qty 90, 90d supply, fill #1

## 2021-05-03 NOTE — Progress Notes (Signed)
BP 110/86    Pulse 80    Wt 124 lb 9.6 oz (56.5 kg)    BMI 20.73 kg/m    Subjective:    Patient ID: Shirley Rollins, female    DOB: 1980/07/15, 41 y.o.   MRN: [id number]  HPI: Shirley Rollins is a 41 y.o. female  Chief Complaint  Patient presents with   ADHD   Anxiety   This visit was completed via video visit through [place] due to the restrictions of the COVID-19 pandemic. All issues as above were discussed and addressed. Physical exam was done as above through visual confirmation on video through MyChart. If it was felt that the patient should be evaluated in the office, they were directed there. The patient verbally consented to this visit. Location of the patient: home Location of the provider: work Those involved with this call:  Provider: Marnee Guarneri, DNP CMA: Irena Reichmann, [place] Desk/Registration: [employer]  Time spent on call:  21 minutes with patient face to face via video conference. More than 50% of this time was spent in counseling and coordination of care. 15 minutes total spent in review of patient's record and preparation of their chart.  I verified patient identity using two factors (patient name and date of birth). Patient consents verbally to being seen via telemedicine visit today.     ANXIETY/STRESS Continues on Effexor 37.5 MG XR daily, she feels overall this is working well, but has some increased stressors recently.  Her goal would be to take only one medication in future -- taking Effexor instead of Adderall for mood and ADD.  She has read about this. Duration: exacerbated Anxious mood: increased due to stressors Excessive worrying: increased Irritability: none Sweating: no Nausea: no Palpitations:no Hyperventilation: no Panic attacks: none Agoraphobia: no  Obscessions/compulsions: no Depressed mood: improved Depression screen [facility] 2/9 05/03/2021 02/01/2021 12/28/2020 10/20/2020 04/18/2020  Decreased Interest 1 1 3  0 0  Down, Depressed,  Hopeless 2 2 3  0 1  PHQ - 2 Score 3 3 6  0 1  Altered sleeping 0 2 2 - 3  Tired, decreased energy 3 2 3  - 3  Change in appetite 0 0 2 - 0  Feeling bad or failure about yourself  1 1 3  - 1  Trouble concentrating 1 1 2  - 1  Moving slowly or fidgety/restless 0 0 0 - 1  Suicidal thoughts 0 0 0 - 0  PHQ-9 Score 8 9 18  - 10  Difficult doing work/chores Somewhat difficult Somewhat difficult Extremely dIfficult - Not difficult at all  Anhedonia: improved Weight changes: no Insomnia: yes hard to stay asleep  Hypersomnia: no Fatigue/loss of energy: none Feelings of worthlessness: none Feelings of guilt: none Impaired concentration/indecisiveness: none Suicidal ideations: no  Crying spells: yes Recent Stressors/Life Changes: yes   Relationship problems: yes   Family stress: no     Financial stress: none   Job stress: yes    Recent death/loss: no  GAD 7 : Generalized Anxiety Score 05/03/2021 02/01/2021 12/28/2020  Nervous, Anxious, on Edge 2 2 3   Control/stop worrying 2 1 3   Worry too much - different things 1 2 3   Trouble relaxing 1 1 2   Restless 0 1 0  Easily annoyed or irritable 2 1 2   Afraid - awful might happen 1 0 3  Total GAD 7 Score 9 8 16   Anxiety Difficulty Somewhat difficult Somewhat difficult Extremely difficult    ADD She continues on Adderall XR 25 MG daily.  She been on this regimen for 8 years and overall works well.  Last fill on PDMP review 04/06/21. Status: stable Satisfied with current therapy: yes Medication compliance:  good compliance Controlled substance contract: yes Previous psychiatry evaluation: no Previous medications: yes adderall XR   Taking meds on weekends/vacations: yes Work/school performance:  good Difficulty sustaining attention/completing tasks:  occasional Distracted by extraneous stimuli: no Does not listen when spoken to: no  Fidgets with hands or feet: no Unable to stay in seat: no Blurts out/interrupts others: no ADHD Medication Side  Effects: no    Decreased appetite: no    Headache: no    Sleeping disturbance pattern: no    Irritability: no    Rebound effects (worse than baseline) off medication: no    Anxiousness: no    Dizziness: no    Tics: no  Relevant past medical, surgical, family and social history reviewed and updated as indicated. Interim medical history since our last visit reviewed. Allergies and medications reviewed and updated.  Review of Systems  Constitutional:  Negative for activity change, appetite change, diaphoresis, fatigue and fever.  Respiratory:  Negative for cough, chest tightness and shortness of breath.   Cardiovascular:  Negative for chest pain, palpitations and leg swelling.  Gastrointestinal: Negative.   Neurological: Negative.   Psychiatric/Behavioral:  Positive for sleep disturbance. Negative for decreased concentration, self-injury and suicidal ideas. The patient is not nervous/anxious.    Per HPI unless specifically indicated above     Objective:    BP 110/86    Pulse 80    Wt 124 lb 9.6 oz (56.5 kg)    BMI 20.73 kg/m   Wt Readings from Last 3 Encounters:  05/03/21 124 lb 9.6 oz (56.5 kg)  10/11/20 118 lb 12.8 oz (53.9 kg)  04/18/20 117 lb (53.1 kg)    Physical Exam Vitals and nursing note reviewed.  Constitutional:      General: She is awake. She is not in acute distress.    Appearance: She is well-developed. She is not ill-appearing.  HENT:     Head: Normocephalic.     Right Ear: Hearing normal.     Left Ear: Hearing normal.  Eyes:     General: Lids are normal.        Right eye: No discharge.        Left eye: No discharge.     Conjunctiva/sclera: Conjunctivae normal.  Pulmonary:     Effort: Pulmonary effort is normal. No accessory muscle usage or respiratory distress.  Musculoskeletal:     Cervical back: Normal range of motion.  Neurological:     Mental Status: She is alert and oriented to person, place, and time.  Psychiatric:        Attention and  Perception: Attention normal.        Mood and Affect: Mood normal.        Behavior: Behavior normal. Behavior is cooperative.        Thought Content: Thought content normal.        Judgment: Judgment normal.   Results for orders placed or performed in visit on 10/20/20  Comprehensive metabolic panel  Result Value Ref Range   Glucose 95 65 - 99 mg/dL   BUN 7 6 - 24 mg/dL   Creatinine, Ser 0.67 0.57 - 1.00 mg/dL   eGFR 112 >59 mL/min/1.73   BUN/Creatinine Ratio 10 9 - 23   Sodium 137 134 - 144 mmol/L   Potassium 4.1 3.5 - 5.2 mmol/L  Chloride 99 96 - 106 mmol/L   CO2 22 20 - 29 mmol/L   Calcium 10.0 8.7 - 10.2 mg/dL   Total Protein 8.0 6.0 - 8.5 g/dL   Albumin 5.2 (H) 3.8 - 4.8 g/dL   Globulin, Total 2.8 1.5 - 4.5 g/dL   Albumin/Globulin Ratio 1.9 1.2 - 2.2   Bilirubin Total 0.6 0.0 - 1.2 mg/dL   Alkaline Phosphatase 42 (L) 44 - 121 IU/L   AST 22 0 - 40 IU/L   ALT 17 0 - 32 IU/L  CBC with Differential/Platelet  Result Value Ref Range   WBC 6.5 3.4 - 10.8 x10E3/uL   RBC 4.45 3.77 - 5.28 x10E6/uL   Hemoglobin 14.1 11.1 - 15.9 g/dL   Hematocrit 42.2 34.0 - 46.6 %   MCV 95 79 - 97 fL   MCH 31.7 26.6 - 33.0 pg   MCHC 33.4 31.5 - 35.7 g/dL   RDW 11.4 (L) 11.7 - 15.4 %   Platelets 321 150 - 450 x10E3/uL   Neutrophils 60 Not Estab. %   Lymphs 30 Not Estab. %   Monocytes 8 Not Estab. %   Eos 1 Not Estab. %   Basos 1 Not Estab. %   Neutrophils Absolute 3.9 1.4 - 7.0 x10E3/uL   Lymphocytes Absolute 2.0 0.7 - 3.1 x10E3/uL   Monocytes Absolute 0.5 0.1 - 0.9 x10E3/uL   EOS (ABSOLUTE) 0.1 0.0 - 0.4 x10E3/uL   Basophils Absolute 0.1 0.0 - 0.2 x10E3/uL   Immature Granulocytes 0 Not Estab. %   Immature Grans (Abs) 0.0 0.0 - 0.1 x10E3/uL  Lipid Panel w/o Chol/HDL Ratio  Result Value Ref Range   Cholesterol, Total 177 100 - 199 mg/dL   Triglycerides 144 0 - 149 mg/dL   HDL 54 >39 mg/dL   VLDL Cholesterol Cal 25 5 - 40 mg/dL   LDL Chol Calc (NIH) 98 0 - 99 mg/dL  TSH  Result Value  Ref Range   TSH 2.600 0.450 - 4.500 uIU/mL  HgB A1c  Result Value Ref Range   Hgb A1c MFr Bld 5.2 4.8 - 5.6 %   Est. average glucose Bld gHb Est-mCnc 103 mg/dL  Lipase  Result Value Ref Range   Lipase 36 14 - 72 U/L  Amylase  Result Value Ref Range   Amylase 58 31 - 110 U/L  [id number] 11+Oxyco+Alc+Crt-Bund  Result Value Ref Range   Ethanol Negative Cutoff=0.020 %   Amphetamines, Urine See Final Results Cutoff=1000 ng/mL   Barbiturate Negative Cutoff=200 ng/mL   BENZODIAZ UR QL Negative Cutoff=200 ng/mL   Cannabinoid Quant, Ur Negative Cutoff=50 ng/mL   Cocaine (Metabolite) Negative Cutoff=300 ng/mL   OPIATE SCREEN URINE Negative Cutoff=300 ng/mL   Oxycodone/Oxymorphone, Urine Negative Cutoff=300 ng/mL   Phencyclidine Negative Cutoff=25 ng/mL   Methadone Screen, Urine Negative Cutoff=300 ng/mL   Propoxyphene Negative Cutoff=300 ng/mL   Meperidine Negative Cutoff=200 ng/mL   Tramadol Negative Cutoff=200 ng/mL   Creatinine 13.4 (L) 20.0 - 300.0 mg/dL   pH, Urine 6.1 4.5 - 8.9  Drug Profile [phone]  Result Value Ref Range   Amphetamines Positive (A) Cutoff=1000   Amphetamine Positive (A)    Amphetamine GC/MS Conf >3000 Cutoff=500 ng/mL   Methamphetamine Negative Cutoff=500  Specific Gravity  Result Value Ref Range   Specific Gravity 1.0032       Assessment & Plan:   Problem List Items Addressed This Visit       Other   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  UDS and controlled substance contract up to date.  Will send 30 day supply + 2 other 30 day supplies predated for future.  She agrees to every 3 month visits, can be virtual if needed.  Her goal in long run is to transition to Effexor only if possible and come off Adderall, discussed this at length with her and recommend no cold [place] stopping but can work on gradual reduction over time with addition of Effexor.      Relevant Medications   amphetamine-dextroamphetamine (ADDERALL XR)  25 MG 24 hr capsule   Anxiety and depression - Primary    Exacerbated due to recent anxieties. At this time denies SI/HI.  Educated on both SSRI and SNRI treatment regimens, which offer benefit for both anxiety and depression.  Increase Effexor XR to 75 MG daily at this time and monitor will reduce back down if needed to.  Her goal in long run is to transition to Effexor only if possible and come off Adderall, discussed this at length with her and recommend no cold [place] stopping but can work on gradual reduction over time with addition of Effexor.  Return in 3 months.      Relevant Medications   venlafaxine XR (EFFEXOR-XR) 75 MG 24 hr capsule    I discussed the assessment and treatment plan with the patient. The patient was provided an opportunity to ask questions and all were answered. The patient agreed with the plan and demonstrated an understanding of the instructions.   The patient was advised to call back or seek an in-person evaluation if the symptoms worsen or if the condition fails to improve as anticipated.   I provided 21+ minutes of time during this encounter.   Follow up plan: Return in about 3 months (around 08/03/2021) for MOOD and ADHD.

## 2021-05-03 NOTE — Assessment & Plan Note (Signed)
Chronic, ongoing.  UDS and controlled substance contract up to date.  Will send 30 day supply + 2 other 30 day supplies predated for future.  She agrees to every 3 month visits, can be virtual if needed.  Her goal in long run is to transition to Effexor only if possible and come off Adderall, discussed this at length with her and recommend no cold turkey stopping but can work on gradual reduction over time with addition of Effexor. 

## 2021-05-03 NOTE — Assessment & Plan Note (Signed)
Exacerbated due to recent anxieties. At this time denies SI/HI.  Educated on both SSRI and SNRI treatment regimens, which offer benefit for both anxiety and depression.  Increase Effexor XR to 75 MG daily at this time and monitor will reduce back down if needed to.  Her goal in long run is to transition to Effexor only if possible and come off Adderall, discussed this at length with her and recommend no cold [place] stopping but can work on gradual reduction over time with addition of Effexor.  Return in 3 months. ?

## 2021-05-04 ENCOUNTER — Other Ambulatory Visit (HOSPITAL_COMMUNITY): Payer: Self-pay

## 2021-05-04 NOTE — Progress Notes (Signed)
Pt scheduled  

## 2021-06-02 ENCOUNTER — Other Ambulatory Visit (HOSPITAL_COMMUNITY): Payer: Self-pay

## 2021-06-03 ENCOUNTER — Other Ambulatory Visit (HOSPITAL_COMMUNITY): Payer: Self-pay

## 2021-07-06 ENCOUNTER — Other Ambulatory Visit (HOSPITAL_COMMUNITY): Payer: Self-pay

## 2021-08-03 ENCOUNTER — Other Ambulatory Visit (HOSPITAL_COMMUNITY): Payer: Self-pay

## 2021-08-03 ENCOUNTER — Other Ambulatory Visit: Payer: Self-pay | Admitting: Nurse Practitioner

## 2021-08-03 MED ORDER — AMPHETAMINE-DEXTROAMPHET ER 25 MG PO CP24
25.0000 mg | ORAL_CAPSULE | ORAL | 0 refills | Status: DC
  Filled 2021-08-05: qty 30, 30d supply, fill #0

## 2021-08-03 NOTE — Telephone Encounter (Signed)
Requested medication (s) are due for refill today - yes  Requested medication (s) are on the active medication list -yes  Future visit scheduled -yes  Last refill: 07/03/21 #30  Notes to clinic: non delegated Rx  Requested Prescriptions  Pending Prescriptions Disp Refills   amphetamine-dextroamphetamine (ADDERALL XR) 25 MG 24 hr capsule 30 capsule 0    Sig: Take 1 capsule by mouth every morning.     Not Delegated - Psychiatry:  Stimulants/ADHD Failed - 08/03/2021  9:56 AM      Failed - This refill cannot be delegated      Passed - Urine Drug Screen completed in last 360 days      Passed - Last BP in normal range    BP Readings from Last 1 Encounters:  05/03/21 110/86         Passed - Last Heart Rate in normal range    Pulse Readings from Last 1 Encounters:  05/03/21 80         Passed - Valid encounter within last 6 months    Recent Outpatient Visits           3 months ago Anxiety and depression   [facility] Hoffman, [place] T, NP   6 months ago ADD (attention deficit disorder) without hyperactivity   [facility] [place], Dorie Rank, NP   7 months ago Anxiety and depression   [facility] [place], [place] T, NP   9 months ago ADD (attention deficit disorder) without hyperactivity   [facility] Cannady, Dorie Rank, NP   9 months ago Encounter to establish care   [facility], Corrie Dandy T, NP       Future Appointments             In 4 days Marjie Skiff, NP [facility]               Requested Prescriptions  Pending Prescriptions Disp Refills   amphetamine-dextroamphetamine (ADDERALL XR) 25 MG 24 hr capsule 30 capsule 0    Sig: Take 1 capsule by mouth every morning.     Not Delegated - Psychiatry:  Stimulants/ADHD Failed - 08/03/2021  9:56 AM      Failed - This refill cannot be delegated      Passed - Urine Drug Screen completed in last 360 days      Passed - Last BP in  normal range    BP Readings from Last 1 Encounters:  05/03/21 110/86         Passed - Last Heart Rate in normal range    Pulse Readings from Last 1 Encounters:  05/03/21 80         Passed - Valid encounter within last 6 months    Recent Outpatient Visits           3 months ago Anxiety and depression   [facility] Wann, [place] T, NP   6 months ago ADD (attention deficit disorder) without hyperactivity   [facility] [place], Dorie Rank, NP   7 months ago Anxiety and depression   [facility] Cannady, Jolene T, NP   9 months ago ADD (attention deficit disorder) without hyperactivity   [facility] Cannady, Dorie Rank, NP   9 months ago Encounter to establish care   [facility] Marjie Skiff, NP       Future Appointments  In 4 days Cannady, Dorie Rank, NP [facility]

## 2021-08-04 ENCOUNTER — Other Ambulatory Visit (HOSPITAL_COMMUNITY): Payer: Self-pay

## 2021-08-05 ENCOUNTER — Other Ambulatory Visit (HOSPITAL_COMMUNITY): Payer: Self-pay

## 2021-08-05 DIAGNOSIS — Z79899 Other long term (current) drug therapy: Secondary | ICD-10-CM | POA: Insufficient documentation

## 2021-08-05 NOTE — Patient Instructions (Incomplete)
Living With Attention Deficit Hyperactivity Disorder If you have been diagnosed with attention deficit hyperactivity disorder (ADHD), you may be relieved that you now know why you have felt or behaved a certain way. Still, you may feel overwhelmed about the treatment ahead. You may also wonder how to get the support you need and how to deal with the condition day-to-day. With treatment and support, you can live with ADHD and manage your symptoms. How to manage lifestyle changes Managing stress Stress is your body's reaction to life changes and events, both good and bad. To cope with the stress of an ADHD diagnosis, it may help to: Learn more about ADHD. Exercise regularly. Even a short daily walk can lower stress levels. Participate in training or education programs (including social skills training classes) that teach you to deal with symptoms.  Medicines Your health care provider may suggest certain medicines if he or she feels that they will help to improve your condition. Stimulant medicines are usually prescribed to treat ADHD, and therapy may also be prescribed. It is important to: Avoid using alcohol and other substances that may prevent your medicines from working properly (may interact). Talk with your pharmacist or health care provider about all the medicines that you take, their possible side effects, and what medicines are safe to take together. Make it your goal to take part in all treatment decisions (shared decision-making). Ask about possible side effects of medicines that your health care provider recommends, and tell him or her how you feel about having those side effects. It is best if shared decision-making with your health care provider is part of your total treatment plan. Relationships To strengthen your relationships with family members while treating your condition, consider taking part in family therapy. You might also attend self-help groups alone or with a loved one. Be  honest about how your symptoms affect your relationships. Make an effort to communicate respectfully instead of fighting, and find ways to show others that you care. Psychotherapy may be useful in helping you cope with how ADHD affects your relationships. How to recognize changes in your condition The following signs may mean that your treatment is working well and your condition is improving: Consistently being on time for appointments. Being more organized at home and work. Other people noticing improvements in your behavior. Achieving goals that you set for yourself. Thinking more clearly. The following signs may mean that your treatment is not working very well: Feeling impatience or more confusion. Missing, forgetting, or being late for appointments. An increasing sense of disorganization and messiness. More difficulty in reaching goals that you set for yourself. Loved ones becoming angry or frustrated with you. Follow these instructions at home: Take over-the-counter and prescription medicines only as told by your health care provider. Check with your health care provider before taking any new medicines. Create structure and an organized atmosphere at home. For example: Make a list of tasks, then rank them from most important to least important. Work on one task at a time until your listed tasks are done. Make a daily schedule and follow it consistently every day. Use an appointment calendar, and check it 2 or 3 times a day to keep on track. Keep it with you when you leave the house. Create spaces where you keep certain things, and always put things back in their places after you use them. Keep all follow-up visits as told by your health care provider. This is important. Where to find support Talking to others    Keep emotion out of important discussions and speak in a calm, logical way. Listen closely and patiently to your loved ones. Try to understand their point of view, and try to  avoid getting defensive. Take responsibility for the consequences of your actions. Ask that others do not take your behaviors personally. Aim to solve problems as they come up, and express your feelings instead of bottling them up. Talk openly about what you need from your loved ones and how they can support you. Consider going to family therapy sessions or having your family meet with a specialist who deals with ADHD-related behavior problems. Finances Not all insurance plans cover mental health care, so it is important to check with your insurance carrier. If paying for co-pays or counseling services is a problem, search for a local or county mental health care center. Public mental health care services may be offered there at a low cost or no cost when you are not able to see a private health care provider. If you are taking medicine for ADHD, you may be able to get the generic form, which may be less expensive than brand-name medicine. Some makers of prescription medicines also offer help to patients who cannot afford the medicines that they need. Questions to ask your health care provider: What are the risks and benefits of taking medicines? Would I benefit from therapy? How often should I follow up with a health care provider? Contact a health care provider if: You have side effects from your medicines, such as: Repeated muscle twitches, coughing, or speech outbursts. Sleep problems. Loss of appetite. Depression. New or worsening behavior problems. Dizziness. Unusually fast heartbeat. Stomach pains. Headaches. Get help right away if: You have a severe reaction to a medicine. Your behavior suddenly gets worse. Summary With treatment and support, you can live with ADHD and manage your symptoms. The medicines that are most often prescribed for ADHD are stimulants. Consider taking part in family therapy or self-help groups with family members or friends. When you talk with friends  and family about your ADHD, be patient and communicate openly. Take over-the-counter and prescription medicines only as told by your health care provider. Check with your health care provider before taking any new medicines. This information is not intended to replace advice given to you by your health care provider. Make sure you discuss any questions you have with your health care provider. Document Revised: 06/26/2019 Document Reviewed: 07/29/2019 Elsevier Patient Education  2023 Elsevier Inc.  

## 2021-08-07 ENCOUNTER — Telehealth: Payer: No Typology Code available for payment source | Admitting: Nurse Practitioner

## 2021-08-09 NOTE — Progress Notes (Signed)
There were no vitals taken for this visit.   Subjective:    Patient ID: Shirley Rollins, female    DOB: 18-Dec-1980, 41 y.o.   MRN: [id number]  HPI: Shirley Rollins is a 41 y.o. female  Chief Complaint  Patient presents with   Anxiety   Depression   Medication Management    Patient states feel pretty well since last office visit    This visit was completed via video visit through [facility] due to the restrictions of the COVID-19 pandemic. All issues as above were discussed and addressed. Physical exam was done as above through visual confirmation on video through MyChart. If it was felt that the patient should be evaluated in the office, they were directed there. The patient verbally consented to this visit. Location of the patient: home Location of the provider: work Those involved with this call:  Provider: Jon Billings, NP CMA: Valinda Hoar, [place] Desk/Registration: [employer]  Time spent on call:  21 minutes with patient face to face via video conference. More than 50% of this time was spent in counseling and coordination of care. 15 minutes total spent in review of patient's record and preparation of their chart.  I verified patient identity using two factors (patient name and date of birth). Patient consents verbally to being seen via telemedicine visit today.     ANXIETY/STRESS Continues on Effexor 75 MG XR daily, she feels overall this is working well, she is glad the dose was increased.  Her goal would be to take only one medication in future -- taking Effexor instead of Adderall for mood and ADD.  She has read about this. Duration: exacerbated Anxious mood: increased due to stressors Excessive worrying: increased Irritability: none Sweating: no Nausea: no Palpitations:no Hyperventilation: no Panic attacks: none Agoraphobia: no  Obscessions/compulsions: no Depressed mood: improved    2021-08-14    9:31 AM 05/03/2021    4:31 PM 02/01/2021    4:22 PM  12/28/2020    4:39 PM 10/20/2020   10:25 AM  Depression screen PHQ 2/9  Decreased Interest 1 1 1 3  0  Down, Depressed, Hopeless 1 2 2 3  0  PHQ - 2 Score 2 3 3 6  0  Altered sleeping 1 0 2 2   Tired, decreased energy 0 3 2 3    Change in appetite 0 0 0 2   Feeling bad or failure about yourself  1 1 1 3    Trouble concentrating 0 1 1 2    Moving slowly or fidgety/restless 0 0 0 0   Suicidal thoughts 0 0 0 0   PHQ-9 Score 4 8 9 18    Difficult doing work/chores Not difficult at all Somewhat difficult Somewhat difficult Extremely dIfficult   Anhedonia: improved Weight changes: no Insomnia: yes hard to stay asleep  Hypersomnia: no Fatigue/loss of energy: none Feelings of worthlessness: none Feelings of guilt: none Impaired concentration/indecisiveness: none Suicidal ideations: no  Crying spells: yes Recent Stressors/Life Changes: yes   Relationship problems: yes   Family stress: no     Financial stress: none   Job stress: yes    Recent death/loss: no     Aug 14, 2021    9:32 AM 05/03/2021    4:32 PM 02/01/2021    4:23 PM 12/28/2020    4:40 PM  GAD 7 : Generalized Anxiety Score  Nervous, Anxious, on Edge 2 2 2 3   Control/stop worrying 1 2 1 3   Worry too much - different things 2 1 2  3  Trouble relaxing 1 1 1 2   Restless 1 0 1 0  Easily annoyed or irritable 2 2 1 2   Afraid - awful might happen 0 1 0 3  Total GAD 7 Score 9 9 8 16   Anxiety Difficulty Not difficult at all Somewhat difficult Somewhat difficult Extremely difficult    ADD She continues on Adderall XR 25 MG daily.  She been on this regimen for 8 years and overall works well.  Last fill on PDMP review 08/10/2021 Status: stable Satisfied with current therapy: yes Medication compliance:  good compliance Controlled substance contract: yes Previous psychiatry evaluation: no Previous medications: yes adderall XR   Taking meds on weekends/vacations: yes Work/school performance:  good Difficulty sustaining attention/completing  tasks:  occasional Distracted by extraneous stimuli: no Does not listen when spoken to: no  Fidgets with hands or feet: no Unable to stay in seat: no Blurts out/interrupts others: no ADHD Medication Side Effects: no    Decreased appetite: no    Headache: no    Sleeping disturbance pattern: no    Irritability: no    Rebound effects (worse than baseline) off medication: no    Anxiousness: no    Dizziness: no    Tics: no  Relevant past medical, surgical, family and social history reviewed and updated as indicated. Interim medical history since our last visit reviewed. Allergies and medications reviewed and updated.  Review of Systems  Constitutional:  Negative for activity change, appetite change, diaphoresis, fatigue and fever.  Respiratory:  Negative for cough, chest tightness and shortness of breath.   Cardiovascular:  Negative for chest pain, palpitations and leg swelling.  Gastrointestinal: Negative.   Neurological: Negative.   Psychiatric/Behavioral:  Positive for sleep disturbance. Negative for decreased concentration, self-injury and suicidal ideas. The patient is not nervous/anxious.     Per HPI unless specifically indicated above     Objective:    There were no vitals taken for this visit.  Wt Readings from Last 3 Encounters:  05/03/21 124 lb 9.6 oz (56.5 kg)  10/11/20 118 lb 12.8 oz (53.9 kg)  04/18/20 117 lb (53.1 kg)    Physical Exam Vitals and nursing note reviewed.  HENT:     Head: Normocephalic.     Right Ear: Hearing normal.     Left Ear: Hearing normal.     Nose: Nose normal.  Eyes:     Pupils: Pupils are equal, round, and reactive to light.  Pulmonary:     Effort: Pulmonary effort is normal. No respiratory distress.  Neurological:     Mental Status: She is alert.  Psychiatric:        Mood and Affect: Mood normal.        Behavior: Behavior normal.        Thought Content: Thought content normal.        Judgment: Judgment normal.    Results for  orders placed or performed in visit on 10/20/20  Comprehensive metabolic panel  Result Value Ref Range   Glucose 95 65 - 99 mg/dL   BUN 7 6 - 24 mg/dL   Creatinine, Ser 0.67 0.57 - 1.00 mg/dL   eGFR 112 >59 mL/min/1.73   BUN/Creatinine Ratio 10 9 - 23   Sodium 137 134 - 144 mmol/L   Potassium 4.1 3.5 - 5.2 mmol/L   Chloride 99 96 - 106 mmol/L   CO2 22 20 - 29 mmol/L   Calcium 10.0 8.7 - 10.2 mg/dL   Total Protein 8.0 6.0 -  8.5 g/dL   Albumin 5.2 (H) 3.8 - 4.8 g/dL   Globulin, Total 2.8 1.5 - 4.5 g/dL   Albumin/Globulin Ratio 1.9 1.2 - 2.2   Bilirubin Total 0.6 0.0 - 1.2 mg/dL   Alkaline Phosphatase 42 (L) 44 - 121 IU/L   AST 22 0 - 40 IU/L   ALT 17 0 - 32 IU/L  CBC with Differential/Platelet  Result Value Ref Range   WBC 6.5 3.4 - 10.8 x10E3/uL   RBC 4.45 3.77 - 5.28 x10E6/uL   Hemoglobin 14.1 11.1 - 15.9 g/dL   Hematocrit 42.2 34.0 - 46.6 %   MCV 95 79 - 97 fL   MCH 31.7 26.6 - 33.0 pg   MCHC 33.4 31.5 - 35.7 g/dL   RDW 11.4 (L) 11.7 - 15.4 %   Platelets 321 150 - 450 x10E3/uL   Neutrophils 60 Not Estab. %   Lymphs 30 Not Estab. %   Monocytes 8 Not Estab. %   Eos 1 Not Estab. %   Basos 1 Not Estab. %   Neutrophils Absolute 3.9 1.4 - 7.0 x10E3/uL   Lymphocytes Absolute 2.0 0.7 - 3.1 x10E3/uL   Monocytes Absolute 0.5 0.1 - 0.9 x10E3/uL   EOS (ABSOLUTE) 0.1 0.0 - 0.4 x10E3/uL   Basophils Absolute 0.1 0.0 - 0.2 x10E3/uL   Immature Granulocytes 0 Not Estab. %   Immature Grans (Abs) 0.0 0.0 - 0.1 x10E3/uL  Lipid Panel w/o Chol/HDL Ratio  Result Value Ref Range   Cholesterol, Total 177 100 - 199 mg/dL   Triglycerides 144 0 - 149 mg/dL   HDL 54 >39 mg/dL   VLDL Cholesterol Cal 25 5 - 40 mg/dL   LDL Chol Calc (NIH) 98 0 - 99 mg/dL  TSH  Result Value Ref Range   TSH 2.600 0.450 - 4.500 uIU/mL  HgB A1c  Result Value Ref Range   Hgb A1c MFr Bld 5.2 4.8 - 5.6 %   Est. average glucose Bld gHb Est-mCnc 103 mg/dL  Lipase  Result Value Ref Range   Lipase 36 14 - 72 U/L   Amylase  Result Value Ref Range   Amylase 58 31 - 110 U/L  [id number] 11+Oxyco+Alc+Crt-Bund  Result Value Ref Range   Ethanol Negative Cutoff=0.020 %   Amphetamines, Urine See Final Results Cutoff=1000 ng/mL   Barbiturate Negative Cutoff=200 ng/mL   BENZODIAZ UR QL Negative Cutoff=200 ng/mL   Cannabinoid Quant, Ur Negative Cutoff=50 ng/mL   Cocaine (Metabolite) Negative Cutoff=300 ng/mL   OPIATE SCREEN URINE Negative Cutoff=300 ng/mL   Oxycodone/Oxymorphone, Urine Negative Cutoff=300 ng/mL   Phencyclidine Negative Cutoff=25 ng/mL   Methadone Screen, Urine Negative Cutoff=300 ng/mL   Propoxyphene Negative Cutoff=300 ng/mL   Meperidine Negative Cutoff=200 ng/mL   Tramadol Negative Cutoff=200 ng/mL   Creatinine 13.4 (L) 20.0 - 300.0 mg/dL   pH, Urine 6.1 4.5 - 8.9  Drug Profile [phone]  Result Value Ref Range   Amphetamines Positive (A) Cutoff=1000   Amphetamine Positive (A)    Amphetamine GC/MS Conf >3000 Cutoff=500 ng/mL   Methamphetamine Negative Cutoff=500  Specific Gravity  Result Value Ref Range   Specific Gravity 1.0032       Assessment & Plan:   Problem List Items Addressed This Visit       Other   ADD (attention deficit disorder) without hyperactivity    Chronic, ongoing.  UDS and controlled substance contract up to date.  Will need to be updated at next visit. Will send 30 day supply + 2 other 30  day supplies predated for future.  She agrees to every 3 month visits, can be virtual if needed.  Follow up in 3 months.  Call sooner if concerns arise.       Anxiety and depression - Primary    Chronic. Improved with Effexor 78m daily.  She is glad that she increased the medication at last visit.  At this time denies SI/HI.  Educated on both SSRI and SNRI treatment regimens, which offer benefit for both anxiety and depression.  Return in 3 months.  Call sooner if concerns arise.       Other Visit Diagnoses     Encounter for screening mammogram for malignant neoplasm of  breast       Relevant Orders   MM 3D SCREEN BREAST BILATERAL       I discussed the assessment and treatment plan with the patient. The patient was provided an opportunity to ask questions and all were answered. The patient agreed with the plan and demonstrated an understanding of the instructions.   The patient was advised to call back or seek an in-person evaluation if the symptoms worsen or if the condition fails to improve as anticipated.   I provided 21+ minutes of time during this encounter.   Follow up plan: Return in about 3 months (around 11/10/2021) for Depression/Anxiety FU, ADHD FU.

## 2021-08-10 ENCOUNTER — Telehealth (INDEPENDENT_AMBULATORY_CARE_PROVIDER_SITE_OTHER): Payer: No Typology Code available for payment source | Admitting: Nurse Practitioner

## 2021-08-10 ENCOUNTER — Encounter: Payer: Self-pay | Admitting: Nurse Practitioner

## 2021-08-10 DIAGNOSIS — F32A Depression, unspecified: Secondary | ICD-10-CM

## 2021-08-10 DIAGNOSIS — F419 Anxiety disorder, unspecified: Secondary | ICD-10-CM

## 2021-08-10 DIAGNOSIS — Z1231 Encounter for screening mammogram for malignant neoplasm of breast: Secondary | ICD-10-CM | POA: Diagnosis not present

## 2021-08-10 DIAGNOSIS — F988 Other specified behavioral and emotional disorders with onset usually occurring in childhood and adolescence: Secondary | ICD-10-CM

## 2021-08-10 NOTE — Assessment & Plan Note (Signed)
Chronic. Improved with Effexor 75mg  daily.  She is glad that she increased the medication at last visit.  At this time denies SI/HI.  Educated on both SSRI and SNRI treatment regimens, which offer benefit for both anxiety and depression.  Return in 3 months.  Call sooner if concerns arise.

## 2021-08-10 NOTE — Assessment & Plan Note (Signed)
Chronic, ongoing.  UDS and controlled substance contract up to date.  Will need to be updated at next visit. Will send 30 day supply + 2 other 30 day supplies predated for future.  She agrees to every 3 month visits, can be virtual if needed.  Follow up in 3 months.  Call sooner if concerns arise.

## 2021-08-28 ENCOUNTER — Other Ambulatory Visit (HOSPITAL_COMMUNITY): Payer: Self-pay

## 2021-08-28 MED ORDER — AMPHETAMINE-DEXTROAMPHET ER 25 MG PO CP24
25.0000 mg | ORAL_CAPSULE | ORAL | 0 refills | Status: DC
  Filled 2021-09-04: qty 30, 30d supply, fill #0

## 2021-08-28 MED ORDER — AMPHETAMINE-DEXTROAMPHET ER 25 MG PO CP24: 25.0000 mg | ORAL_CAPSULE | ORAL | 0 refills | Status: DC

## 2021-08-28 MED ORDER — AMPHETAMINE-DEXTROAMPHET ER 25 MG PO CP24
ORAL_CAPSULE | Freq: Every morning | ORAL | 0 refills | Status: DC
  Filled 2021-10-05: qty 30, 30d supply, fill #0

## 2021-08-28 NOTE — Addendum Note (Signed)
Addended by: Larae Grooms on: 08/28/2021 12:24 PM   Modules accepted: Orders

## 2021-09-04 ENCOUNTER — Other Ambulatory Visit (HOSPITAL_COMMUNITY): Payer: Self-pay

## 2021-09-05 ENCOUNTER — Other Ambulatory Visit (HOSPITAL_COMMUNITY): Payer: Self-pay

## 2021-10-04 ENCOUNTER — Emergency Department
Admission: EM | Admit: 2021-10-04 | Discharge: 2021-10-04 | Disposition: A | Discharge status: Home / Self Care | Payer: No Typology Code available for payment source | Attending: Emergency Medicine | Admitting: Emergency Medicine

## 2021-10-04 ENCOUNTER — Other Ambulatory Visit: Payer: Self-pay

## 2021-10-04 ENCOUNTER — Encounter: Payer: Self-pay | Admitting: Emergency Medicine

## 2021-10-04 ENCOUNTER — Ambulatory Visit: Payer: Self-pay | Admitting: *Deleted

## 2021-10-04 DIAGNOSIS — K625 Hemorrhage of anus and rectum: Secondary | ICD-10-CM | POA: Diagnosis present

## 2021-10-04 LAB — COMPREHENSIVE METABOLIC PANEL
ALT: 23 U/L (ref 0–44)
AST: 23 U/L (ref 15–41)
Albumin: 5 g/dL (ref 3.5–5.0)
Alkaline Phosphatase: 31 U/L — ABNORMAL LOW (ref 38–126)
Anion gap: 10 (ref 5–15)
BUN: 12 mg/dL (ref 6–20)
CO2: 25 mmol/L (ref 22–32)
Calcium: 10 mg/dL (ref 8.9–10.3)
Chloride: 99 mmol/L (ref 98–111)
Creatinine, Ser: 0.68 mg/dL (ref 0.44–1.00)
GFR, Estimated: >60 mL/min (ref >60)
Glucose, Bld: 104 mg/dL — ABNORMAL HIGH (ref 70–99)
Potassium: 4.4 mmol/L (ref 3.5–5.1)
Sodium: 134 mmol/L — ABNORMAL LOW (ref 135–145)
Total Bilirubin: 0.9 mg/dL (ref 0.3–1.2)
Total Protein: 8.4 g/dL — ABNORMAL HIGH (ref 6.5–8.1)

## 2021-10-04 LAB — TYPE AND SCREEN
ABO/RH(D): A POS
Antibody Screen: NEGATIVE

## 2021-10-04 LAB — CBC
HCT: 42.9 % (ref 36.0–46.0)
Hemoglobin: 13.8 g/dL (ref 12.0–15.0)
MCH: 30.9 pg (ref 26.0–34.0)
MCHC: 32.2 g/dL (ref 30.0–36.0)
MCV: 96 fL (ref 80.0–100.0)
Platelets: 336 10*3/uL (ref 150–400)
RBC: 4.47 MIL/uL (ref 3.87–5.11)
RDW: 12.5 % (ref 11.5–15.5)
WBC: 7.5 10*3/uL (ref 4.0–10.5)
nRBC: 0.3 % — ABNORMAL HIGH (ref 0.0–0.2)

## 2021-10-04 MED ORDER — HYDROCORTISONE ACETATE 25 MG RE SUPP
25.0000 mg | Freq: Two times a day (BID) | RECTAL | 1 refills | Status: AC | PRN
  Filled 2021-10-04: qty 12, 6d supply, fill #0

## 2021-10-04 MED ORDER — HYDROCORTISONE ACETATE 25 MG RE SUPP
25.0000 mg | Freq: Two times a day (BID) | RECTAL | 1 refills | Status: DC
  Filled 2021-10-04: qty 12, 6d supply, fill #0

## 2021-10-04 NOTE — ED Notes (Signed)
Pt to ED for blood in stool that started this am, pt described as bright red, denies any hemorrhoids. Denies diarrhea, N/V.  Pt has had lower abdominal cramping- however pt states she is about to start menstrual cycle.   Pt has family hx of pancreatic cancer

## 2021-10-04 NOTE — ED Triage Notes (Signed)
Patient reports episode of blood in her stool this morning. Had a second episode but only blood on the toilet paper.

## 2021-10-04 NOTE — ED Provider Notes (Signed)
[facility] Provider Note    Event Date/Time   First MD Initiated Contact with Patient 10/04/21 1808     (approximate)   History   Chief Complaint Rectal Bleeding   HPI  Shirley Rollins is a 41 y.o. female with past medical history of anemia and migraines who presents to the ED complaining of rectal bleeding.  Patient reports that she had a bowel movement earlier this morning where she noticed bright red blood mixed with her stool.  She then had a second bowel movement at work where she again noticed bright red blood mixed with her stool, however states that it was lower in quantity.  She noticed a small amount of blood on the toilet paper when she went to wipe as well.  She has not noticed any bleeding since then and denies any further bowel movements.  She has not had any dysuria, hematuria, vaginal bleeding, or discharge.  She denies any history of similar symptoms and does not take any blood thinners.  She has never had a colonoscopy.     Physical Exam   Triage Vital Signs: ED Triage Vitals  Enc Vitals Group     BP 10/04/21 1614 (!) 136/109     Pulse Rate 10/04/21 1614 98     Resp 10/04/21 1614 16     Temp 10/04/21 1614 97.8 F (36.6 C)     Temp Source 10/04/21 1614 Oral     SpO2 10/04/21 1614 98 %     Weight 10/04/21 1615 124 lb (56.2 kg)     Height 10/04/21 1615 5\' 6"  (1.676 m)     Head Circumference --      Peak Flow --      Pain Score 10/04/21 1614 1     Pain Loc --      Pain Edu? --      Excl. in GC? --     Most recent vital signs: Vitals:   10/04/21 1614 10/04/21 1755  BP: (!) 136/109 (!) 148/98  Pulse: 98 91  Resp: 16 20  Temp: 97.8 F (36.6 C)   SpO2: 98% 99%    Constitutional: Alert and oriented. Eyes: Conjunctivae are normal. Head: Atraumatic. Nose: No congestion/rhinnorhea. Mouth/Throat: Mucous membranes are moist.  Cardiovascular: Normal rate, regular rhythm. Grossly normal heart sounds.  2+ radial pulses  bilaterally. Respiratory: Normal respiratory effort.  No retractions. Lungs CTAB. Gastrointestinal: Soft and nontender. No distention.  Rectal exam without hemorrhoids or fissures, no active bleeding noted.  Stool is guaiac negative. Musculoskeletal: No lower extremity tenderness nor edema.  Neurologic:  Normal speech and language. No gross focal neurologic deficits are appreciated.    ED Results / Procedures / Treatments   Labs (all labs ordered are listed, but only abnormal results are displayed) Labs Reviewed  COMPREHENSIVE METABOLIC PANEL - Abnormal; Notable for the following components:      Result Value   Sodium 134 (*)    Glucose, Bld 104 (*)    Total Protein 8.4 (*)    Alkaline Phosphatase 31 (*)    All other components within normal limits  CBC - Abnormal; Notable for the following components:   nRBC 0.3 (*)    All other components within normal limits  POC URINE PREG, ED  POC OCCULT BLOOD, ED  TYPE AND SCREEN    PROCEDURES:  Critical Care performed: No  Procedures   MEDICATIONS ORDERED IN ED: Medications - No data to display   IMPRESSION / MDM /  ASSESSMENT AND PLAN / ED COURSE  I reviewed the triage vital signs and the nursing notes.                              41 y.o. female with past medical history of anemia and migraines who presents to the ED with rectal bleeding starting earlier today.  Patient's presentation is most consistent with acute presentation with potential threat to life or bodily function.  Differential diagnosis includes, but is not limited to, hemorrhoids, anal fissure, lower GI bleed, anemia.  Patient well-appearing and in no acute distress, vital signs are unremarkable and she has a benign abdominal exam.  No active bleeding noted on rectal exam and no fissures or hemorrhoids noted to explain her bleeding.  Her stool is guaiac negative and labs are reassuring with no significant anemia, leukocytosis, electrolyte abnormality, or AKI.   She is appropriate for discharge home with GI follow-up, was counseled to return to the ED for new or worsening symptoms.  Patient agrees with plan.      FINAL CLINICAL IMPRESSION(S) / ED DIAGNOSES   Final diagnoses:  Rectal bleeding     Rx / DC Orders   ED Discharge Orders          Ordered    hydrocortisone (ANUSOL-HC) 25 MG suppository  Every 12 hours,   Status:  Discontinued        10/04/21 1858    hydrocortisone (ANUSOL-HC) 25 MG suppository  2 times daily PRN        10/04/21 1858             Note:  This document was prepared using Dragon voice recognition software and may include unintentional dictation errors.   Chesley Noon, MD 10/04/21 1901

## 2021-10-04 NOTE — Telephone Encounter (Signed)
Reason for Disposition  [1] MODERATE rectal bleeding (small blood clots, passing blood without stool, or toilet water turns red) AND [2] more than once a day  Answer Assessment - Initial Assessment Questions 1. APPEARANCE of BLOOD: "What color is it?" "Is it passed separately, on the surface of the stool, or mixed in with the stool?"      Woke up this morning and had a large amount blood in toilet.   At work I had a small stool with blood in it and on the toilet. I work at the [employer] so I'm freckling out.   Monday I had beets.   My urine did not change color and my stool has been fine.   My mother passed away with pancreatic cancer so that history scars me too.  No abd pain or rectal pain.   No history of hemorrhoids  2. AMOUNT: "How much blood was passed?"      2 episodes this morning.   The toilet water was red with blood and after I got to work and had a small stool it had blood in it too.   A small amt of blood was on the toilet paper too. 3. FREQUENCY: "How many times has blood been passed with the stools?"      2 times this morning. 4. ONSET: "When was the blood first seen in the stools?" (Days or weeks)      This morning 5. DIARRHEA: "Is there also some diarrhea?" If Yes, ask: "How many diarrhea stools in the past 24 hours?"      No diarrhea 6. CONSTIPATION: "Do you have constipation?" If Yes, ask: "How bad is it?"     No constipation 7. RECURRENT SYMPTOMS: "Have you had blood in your stools before?" If Yes, ask: "When was the last time?" and "What happened that time?"      No dizziness.    I'm fatigued lately but thought it was the stress of work.   We have been so busy. 8. BLOOD THINNERS: "Do you take any blood thinners?" (e.g., Coumadin/warfarin, Pradaxa/dabigatran, aspirin)     Not asked 9. OTHER SYMPTOMS: "Do you have any other symptoms?"  (e.g., abdomen pain, vomiting, dizziness, fever)     Denies abd pain or dizziness. 10. PREGNANCY: "Is there any chance you  are pregnant?" "When was your last menstrual period?"       Not asked  Protocols used: Rectal Bleeding-A-AH

## 2021-10-04 NOTE — Telephone Encounter (Signed)
  Chief Complaint: rectal bleeding.   Toilet water red first thing this morning.   Had blood in stool after getting to work and having a small stool.   Symptoms: Bleeding into toilet water and noticed blood in stool when she moved her bowels after arriving at work. Frequency: twice this morning. Pertinent Negatives: Patient denies abd pain, dizziness, history of this. Disposition: [x] ED /[] Urgent Care (no appt availability in office) / [] Appointment(In office/virtual)/ []  [facility]/ [] Home Care/ [] Refused Recommended Disposition /[] [facility]/ []  Follow-up with PCP Additional Notes: Pt agreeable to walking over to the [facility] ED.   (She works in the there at the ).  Information sent to , NP

## 2021-10-05 ENCOUNTER — Other Ambulatory Visit (HOSPITAL_COMMUNITY): Payer: Self-pay

## 2021-10-21 NOTE — Patient Instructions (Signed)

## 2021-10-25 ENCOUNTER — Encounter: Payer: Self-pay | Admitting: Nurse Practitioner

## 2021-10-25 ENCOUNTER — Ambulatory Visit (INDEPENDENT_AMBULATORY_CARE_PROVIDER_SITE_OTHER): Payer: No Typology Code available for payment source | Admitting: Nurse Practitioner

## 2021-10-25 ENCOUNTER — Other Ambulatory Visit (HOSPITAL_COMMUNITY): Payer: Self-pay

## 2021-10-25 VITALS — BP 118/82 | HR 84 | Temp 98.3°F | Ht 65.0 in | Wt 113.4 lb

## 2021-10-25 DIAGNOSIS — F988 Other specified behavioral and emotional disorders with onset usually occurring in childhood and adolescence: Secondary | ICD-10-CM

## 2021-10-25 DIAGNOSIS — F419 Anxiety disorder, unspecified: Secondary | ICD-10-CM

## 2021-10-25 DIAGNOSIS — Z23 Encounter for immunization: Secondary | ICD-10-CM | POA: Diagnosis not present

## 2021-10-25 DIAGNOSIS — Z800 Family history of malignant neoplasm of digestive organs: Secondary | ICD-10-CM

## 2021-10-25 DIAGNOSIS — M25531 Pain in right wrist: Secondary | ICD-10-CM

## 2021-10-25 DIAGNOSIS — Z0000 Encounter for general adult medical examination without abnormal findings: Secondary | ICD-10-CM

## 2021-10-25 DIAGNOSIS — E7800 Pure hypercholesterolemia, unspecified: Secondary | ICD-10-CM

## 2021-10-25 DIAGNOSIS — F32A Depression, unspecified: Secondary | ICD-10-CM

## 2021-10-25 MED ORDER — VENLAFAXINE HCL ER 150 MG PO CP24
150.0000 mg | ORAL_CAPSULE | Freq: Every day | ORAL | 5 refills | Status: DC
  Filled 2021-10-25: qty 90, 90d supply, fill #0
  Filled 2022-02-08: qty 90, 90d supply, fill #1
  Filled 2022-06-08: qty 90, 90d supply, fill #2
  Filled 2022-09-17: qty 90, 90d supply, fill #3

## 2021-10-25 MED ORDER — AMPHETAMINE-DEXTROAMPHET ER 25 MG PO CP24
25.0000 mg | ORAL_CAPSULE | ORAL | 0 refills | Status: DC
Start: 2022-01-03 — End: 2022-02-08
  Filled 2022-01-08: qty 30, 30d supply, fill #0

## 2021-10-25 MED ORDER — AMPHETAMINE-DEXTROAMPHET ER 25 MG PO CP24
ORAL_CAPSULE | Freq: Every morning | ORAL | 0 refills | Status: DC
  Filled 2021-12-07: qty 30, 30d supply, fill #0

## 2021-10-25 MED ORDER — AMPHETAMINE-DEXTROAMPHET ER 25 MG PO CP24
25.0000 mg | ORAL_CAPSULE | ORAL | 0 refills | Status: DC
Start: 2021-11-03 — End: 2022-04-02
  Filled 2021-11-07: qty 30, 30d supply, fill #0

## 2021-10-25 MED ORDER — HYDROXYZINE PAMOATE 25 MG PO CAPS
25.0000 mg | ORAL_CAPSULE | Freq: Three times a day (TID) | ORAL | 1 refills | Status: AC | PRN
  Filled 2021-10-25: qty 45, 15d supply, fill #0
  Filled 2022-09-17: qty 45, 15d supply, fill #1

## 2021-10-25 NOTE — Assessment & Plan Note (Signed)
Acute, suspect some tendonitis -- recommend Voltaren gel as needed at home massaged into tendon area.  Gentle stretches daily and including rest breaks from computer.  Wear wrist support as needed.  At this time is stable, return to office if worsening symptoms.

## 2021-10-25 NOTE — Progress Notes (Signed)
BP 118/82   Pulse 84   Temp 98.3 F (36.8 C) (Oral)   Ht 5\' 5"  (1.651 m)   Wt 113 lb 6.4 oz (51.4 kg)   LMP 09/16/2021   SpO2 99%   BMI 18.87 kg/m    Subjective:    Patient ID: Shirley Rollins, female    DOB: September 25, 1980, 41 y.o.   MRN: [id number]  HPI: Shirley Rollins is a 41 y.o. female presenting on 10/25/2021 for comprehensive medical examination. Current medical complaints include:none  She currently lives with: self and son Menopausal Symptoms: no  ADD She continues on Adderall XR 25 MG daily.  She been on this regimen for > 8 years and overall works well.  Last fill on PDMP review 10/05/21. Status: stable Satisfied with current therapy: yes Medication compliance:  good compliance Controlled substance contract: yes Previous psychiatry evaluation: no Previous medications: yes adderall XR   Taking meds on weekends/vacations: yes Work/school performance:  good Difficulty sustaining attention/completing tasks:  occasional Distracted by extraneous stimuli: no Does not listen when spoken to: no  Fidgets with hands or feet: no Unable to stay in seat: no Blurts out/interrupts others: no ADHD Medication Side Effects: no    Decreased appetite: no    Headache: no    Sleeping disturbance pattern: no    Irritability: no    Rebound effects (worse than baseline) off medication: no    Anxiousness: no    Dizziness: no    Tics: no  WRIST PAIN (RIGHT) For past month, certain movements -- turning key to unlock door will have discomfort to radial aspect up towards elbow area.  Right hand dominant -- not bothering her with typing or using mouse.  No numbness. Duration: months Involved wrist: right Mechanism of injury:  no trauma Location: radial Onset: sudden Severity: 7/10  Quality:  sharp and shooting Frequency: intermittent Radiation: yes Aggravating factors: turning Alleviating factors: rest  Status: stable Treatments attempted: none    Relief with NSAIDs?:  No  NSAIDs Taken Weakness: no Numbness: none Redness: no Bruising: no Swelling: no Fevers: no  DEPRESSION Taking Effexor XR 75 MG daily, increased anxiety recently with recent health issues -- rectal bleeding.  No further bleeding since acute episode.  Is scheduled to see GI.  Her mother has history of pancreatic cancer diagnosed at age 70.   Mood status: exacerbated Satisfied with current treatment?: yes Symptom severity: moderate  Duration of current treatment : chronic Side effects: no Medication compliance: good compliance Psychotherapy/counseling: yes in past Depressed mood: yes Anxious mood: yes Anhedonia: yes Significant weight loss or gain: no Insomnia: none Fatigue: yes Feelings of worthlessness or guilt: no Impaired concentration/indecisiveness: no Suicidal ideations: no Hopelessness: no Crying spells: no    10/25/2021    4:06 PM 08/10/2021    9:31 AM 05/03/2021    4:31 PM 02/01/2021    4:22 PM 12/28/2020    4:39 PM  Depression screen PHQ 2/9  Decreased Interest 1 1 1 1 3   Down, Depressed, Hopeless 2 1 2 2 3   PHQ - 2 Score 3 2 3 3 6   Altered sleeping 1 1 0 2 2  Tired, decreased energy 3 0 3 2 3   Change in appetite 0 0 0 0 2  Feeling bad or failure about yourself  1 1 1 1 3   Trouble concentrating 2 0 1 1 2   Moving slowly or fidgety/restless 0 0 0 0 0  Suicidal thoughts 0 0 0 0 0  PHQ-9  Score 10 4 8 9 18   Difficult doing work/chores Somewhat difficult Not difficult at all Somewhat difficult Somewhat difficult Extremely dIfficult       10/25/2021    4:07 PM 08/10/2021    9:32 AM 05/03/2021    4:32 PM 02/01/2021    4:23 PM  GAD 7 : Generalized Anxiety Score  Nervous, Anxious, on Edge 3 2 2 2   Control/stop worrying 3 1 2 1   Worry too much - different things 1 2 1 2   Trouble relaxing 2 1 1 1   Restless 1 1 0 1  Easily annoyed or irritable 3 2 2 1   Afraid - awful might happen 1 0 1 0  Total GAD 7 Score 14 9 9 8   Anxiety Difficulty Somewhat difficult Not difficult at  all Somewhat difficult Somewhat difficult        04/18/2020    8:52 AM 08/10/2021    9:31 AM 10/04/2021    4:17 PM 10/25/2021    4:06 PM 10/25/2021    4:18 PM  Fall Risk  Falls in the past year? 0 0  0 0  Was there an injury with Fall? 0 0  0 0  Fall Risk Category Calculator 0 0  0 0  Fall Risk Category Low Low  Low Low  Patient Fall Risk Level Low fall risk Low fall risk Low fall risk Low fall risk Low fall risk  Patient at Risk for Falls Due to No Fall Risks No Fall Risks  No Fall Risks No Fall Risks  Fall risk Follow up Falls evaluation completed Falls evaluation completed  Falls evaluation completed Falls prevention discussed    Functional Status Survey: Is the patient deaf or have difficulty hearing?: No Does the patient have difficulty seeing, even when wearing glasses/contacts?: No Does the patient have difficulty concentrating, remembering, or making decisions?: No Does the patient have difficulty walking or climbing stairs?: No Does the patient have difficulty dressing or bathing?: No Does the patient have difficulty doing errands alone such as visiting a doctor's office or shopping?: No   Past Medical History:  Past Medical History:  Diagnosis Date   ADD (attention deficit disorder)    Anemia    Migraines     Surgical History:  Past Surgical History:  Procedure Laterality Date   etopic pregnancy  1999   TONSILLECTOMY  in 86's    Medications:  Current Outpatient Medications on File Prior to Visit  Medication Sig   Multiple Vitamin (MULTIVITAMIN) tablet Take 1 tablet by mouth daily.   No current facility-administered medications on file prior to visit.    Allergies:  Allergies  Allergen Reactions   Penicillin G Anaphylaxis   Sulfa Antibiotics Other (See Comments)    Social History:  Social History   Socioeconomic History   Marital status: Divorced    Spouse name: Not on file   Number of children: 1   Years of education: Not on file   Highest  education level: Not on file  Occupational History   Occupation: dietician  Tobacco Use   Smoking status: Former    Types: Cigarettes    Quit date: 09/01/2018    Years since quitting: 3.1   Smokeless tobacco: Never   Tobacco comments:    3/4 of a pack per day  Vaping Use   Vaping Use: Never used  Substance and Sexual Activity   Alcohol use: Yes    Comment: occassional   Drug use: No   Sexual activity: Not  Currently  Other Topics Concern   Not on file  Social History Narrative   Not on file   Social Determinants of Health   Financial Resource Strain: Low Risk  (10/11/2020)   Overall Financial Resource Strain (CARDIA)    Difficulty of Paying Living Expenses: Not very hard  Food Insecurity: No Food Insecurity (10/11/2020)   Hunger Vital Sign    Worried About Running Out of Food in the Last Year: Never true    Ran Out of Food in the Last Year: Never true  Transportation Needs: No Transportation Needs (10/11/2020)   PRAPARE - Administrator, Civil Service (Medical): No    Lack of Transportation (Non-Medical): No  Physical Activity: Sufficiently Active (10/11/2020)   Exercise Vital Sign    Days of Exercise per Week: 7 days    Minutes of Exercise per Session: 30 min  Stress: No Stress Concern Present (10/11/2020)   [employer] of Occupational Health - Occupational Stress Questionnaire    Feeling of Stress : Only a little  Social Connections: Moderately Isolated (10/11/2020)   Social Connection and Isolation Panel [NHANES]    Frequency of Communication with Friends and Family: More than three times a week    Frequency of Social Gatherings with Friends and Family: More than three times a week    Attends Religious Services: More than 4 times per year    Active Member of [employer] or Organizations: No    Attends Banker Meetings: Never    Marital Status: Divorced  Catering manager Violence: Not At Risk (10/11/2020)   Humiliation, Afraid, Rape, and Kick  questionnaire    Fear of Current or Ex-Partner: No    Emotionally Abused: No    Physically Abused: No    Sexually Abused: No   Social History   Tobacco Use  Smoking Status Former   Types: Cigarettes   Quit date: 09/01/2018   Years since quitting: 3.1  Smokeless Tobacco Never  Tobacco Comments   3/4 of a pack per day   Social History   Substance and Sexual Activity  Alcohol Use Yes   Comment: occassional    Family History:  Family History  Problem Relation Age of Onset   Diabetes Mother    Pancreatic cancer Mother 65   Cancer Mother        pancreatic   Heart attack Maternal Grandfather    Heart disease Maternal Grandfather    Breast cancer Neg Hx     Past medical history, surgical history, medications, allergies, family history and social history reviewed with patient today and changes made to appropriate areas of the chart.   ROS All other ROS negative except what is listed above and in the HPI.      Objective:    BP 118/82   Pulse 84   Temp 98.3 F (36.8 C) (Oral)   Ht 5\' 5"  (1.651 m)   Wt 113 lb 6.4 oz (51.4 kg)   LMP 09/16/2021   SpO2 99%   BMI 18.87 kg/m   Wt Readings from Last 3 Encounters:  10/25/21 113 lb 6.4 oz (51.4 kg)  10/04/21 124 lb (56.2 kg)  05/03/21 124 lb 9.6 oz (56.5 kg)    Physical Exam Vitals and nursing note reviewed. Exam conducted with a chaperone present.  Constitutional:      General: She is awake. She is not in acute distress.    Appearance: She is well-developed. She is not ill-appearing.  HENT:  Head: Normocephalic and atraumatic.     Right Ear: Hearing, tympanic membrane, ear canal and external ear normal. No drainage.     Left Ear: Hearing, tympanic membrane, ear canal and external ear normal. No drainage.     Nose: Nose normal.     Right Sinus: No maxillary sinus tenderness or frontal sinus tenderness.     Left Sinus: No maxillary sinus tenderness or frontal sinus tenderness.     Mouth/Throat:     Mouth: Mucous  membranes are moist.     Pharynx: Oropharynx is clear. Uvula midline. No pharyngeal swelling, oropharyngeal exudate or posterior oropharyngeal erythema.  Eyes:     General: Lids are normal.        Right eye: No discharge.        Left eye: No discharge.     Extraocular Movements: Extraocular movements intact.     Conjunctiva/sclera: Conjunctivae normal.     Pupils: Pupils are equal, round, and reactive to light.     Visual Fields: Right eye visual fields normal and left eye visual fields normal.  Neck:     Thyroid: No thyromegaly.     Vascular: No carotid bruit.     Trachea: Trachea normal.  Cardiovascular:     Rate and Rhythm: Normal rate and regular rhythm.     Heart sounds: Normal heart sounds. No murmur heard.    No gallop.  Pulmonary:     Effort: Pulmonary effort is normal. No accessory muscle usage or respiratory distress.     Breath sounds: Normal breath sounds.  Chest:  Breasts:    Right: Normal.     Left: Normal.  Abdominal:     General: Bowel sounds are normal.     Palpations: Abdomen is soft. There is no hepatomegaly or splenomegaly.     Tenderness: There is no abdominal tenderness.  Musculoskeletal:        General: Normal range of motion.     Right wrist: Normal.     Left wrist: Normal.     Cervical back: Normal range of motion and neck supple.     Right lower leg: No edema.     Left lower leg: No edema.  Lymphadenopathy:     Head:     Right side of head: No submental, submandibular, tonsillar, preauricular or posterior auricular adenopathy.     Left side of head: No submental, submandibular, tonsillar, preauricular or posterior auricular adenopathy.     Cervical: No cervical adenopathy.     Upper Body:     Right upper body: No supraclavicular, axillary or pectoral adenopathy.     Left upper body: No supraclavicular, axillary or pectoral adenopathy.  Skin:    General: Skin is warm and dry.     Capillary Refill: Capillary refill takes less than 2 seconds.      Findings: No rash.  Neurological:     Mental Status: She is alert and oriented to person, place, and time.     Gait: Gait is intact.     Deep Tendon Reflexes: Reflexes are normal and symmetric.     Reflex Scores:      Brachioradialis reflexes are 2+ on the right side and 2+ on the left side.      Patellar reflexes are 2+ on the right side and 2+ on the left side. Psychiatric:        Attention and Perception: Attention normal.        Mood and Affect: Mood normal.  Speech: Speech normal.        Behavior: Behavior normal. Behavior is cooperative.        Thought Content: Thought content normal.        Judgment: Judgment normal.     Results for orders placed or performed during the hospital encounter of 10/04/21  Comprehensive metabolic panel  Result Value Ref Range   Sodium 134 (L) 135 - 145 mmol/L   Potassium 4.4 3.5 - 5.1 mmol/L   Chloride 99 98 - 111 mmol/L   CO2 25 22 - 32 mmol/L   Glucose, Bld 104 (H) 70 - 99 mg/dL   BUN 12 6 - 20 mg/dL   Creatinine, Ser 6.07 0.44 - 1.00 mg/dL   Calcium 37.1 8.9 - 06.2 mg/dL   Total Protein 8.4 (H) 6.5 - 8.1 g/dL   Albumin 5.0 3.5 - 5.0 g/dL   AST 23 15 - 41 U/L   ALT 23 0 - 44 U/L   Alkaline Phosphatase 31 (L) 38 - 126 U/L   Total Bilirubin 0.9 0.3 - 1.2 mg/dL   GFR, Estimated >69 >48 mL/min   Anion gap 10 5 - 15  CBC  Result Value Ref Range   WBC 7.5 4.0 - 10.5 K/uL   RBC 4.47 3.87 - 5.11 MIL/uL   Hemoglobin 13.8 12.0 - 15.0 g/dL   HCT 54.6 27.0 - 35.0 %   MCV 96.0 80.0 - 100.0 fL   MCH 30.9 26.0 - 34.0 pg   MCHC 32.2 30.0 - 36.0 g/dL   RDW 09.3 81.8 - 29.9 %   Platelets 336 150 - 400 K/uL   nRBC 0.3 (H) 0.0 - 0.2 %  Type and screen [facility]  Result Value Ref Range   ABO/RH(D) A POS    Antibody Screen NEG    Sample Expiration      10/07/2021,2359 Performed at [facility], 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  UDS performed today and controlled subs agreement up to date.  Will send 30 day supply + 2 other 30 day supplies predated for future.  She agrees to every 3 month visits, can be virtual if needed.  Her goal in long run is to transition to Effexor only if possible and come off Adderall, discussed this at length with her and recommend no cold [place] stopping but can work on gradual reduction over time with addition of Effexor.      Relevant Medications   amphetamine-dextroamphetamine (ADDERALL XR) 25 MG 24 hr capsule (Start on 12/02/2021)   Other Relevant Orders   [id number]+Oxyco+Alc+Crt-Bund   Anxiety and depression    Exacerbated due to recent stressors. At this time denies SI/HI.  Increase Effexor XR to 150 MG daily at this time and monitor will reduce back down if needed to + script for Vistaril as needed only for panic.  Her goal in long run is to transition to Effexor only if possible and come off Adderall, discussed this at length with her and recommend no cold [place] stopping but can work on gradual reduction over time with addition of Effexor.  Return in 6 weeks for mood check.      Relevant Medications   venlafaxine XR (EFFEXOR-XR) 150 MG 24 hr capsule  hydrOXYzine (VISTARIL) 25 MG capsule   Other Relevant Orders   CBC with Differential/Platelet   TSH   Elevated low density lipoprotein (LDL) cholesterol level    Noted on past labs, check lipid panel today and continue diet focus at home. The 10-year ASCVD risk score (Arnett DK, et al., 2019) is: 0.5%   Values used to calculate the score:     Age: 32 years     Sex: Female     Is Non-Hispanic African American: No     Diabetic: No     Tobacco smoker: No     Systolic Blood Pressure: 118 mmHg     Is BP treated: No     HDL Cholesterol: 54 mg/dL     Total Cholesterol: 177 mg/dL       Relevant Orders   Comprehensive metabolic  panel   Lipid Panel w/o Chol/HDL Ratio   Family history of pancreatic cancer    In mother, passed at age 59.  Will plan on monitoring labs yearly and as needed if symptoms.      Relevant Orders   Lipase   Amylase   Right wrist pain    Acute, suspect some tendonitis -- recommend Voltaren gel as needed at home massaged into tendon area.  Gentle stretches daily and including rest breaks from computer.  Wear wrist support as needed.  At this time is stable, return to office if worsening symptoms.      Other Visit Diagnoses     Need for Td vaccine       Td vaccine provided today.   Relevant Orders   Td vaccine greater than or equal to 7yo preservative free IM (Completed)   Encounter for annual physical exam       Annual physical today with labs and health maintenance reviewed, discussed with patient.   Relevant Orders   CBC with Differential/Platelet   TSH         Follow up plan: Return in about 6 weeks (around 12/06/2021) for Anxiety + 3 months for ADHD.   LABORATORY TESTING:  - Pap smear: not today, is on cycle  IMMUNIZATIONS:   - Tdap: Tetanus vaccination status reviewed: last tetanus booster within 10 years. - Influenza: Up to date - Pneumovax: Not applicable - Prevnar: Not applicable - HPV: Not applicable - Zostavax vaccine: Not applicable  SCREENING: -Mammogram: Up to date  - Colonoscopy: Not applicable  - Bone Density: Not applicable  -Hearing Test: Not applicable  -Spirometry: Not applicable   PATIENT COUNSELING:   Advised to take 1 mg of folate supplement per day if capable of pregnancy.   Sexuality: Discussed sexually transmitted diseases, partner selection, use of condoms, avoidance of unintended pregnancy  and contraceptive alternatives.   Advised to avoid cigarette smoking.  I discussed with the patient that most people either abstain from alcohol or drink within safe limits (<=14/week and <=4 drinks/occasion for males, <=7/weeks and <= 3  drinks/occasion for females) and that the risk for alcohol disorders and other health effects rises proportionally with the number of drinks per week and how often a drinker exceeds daily limits.  Discussed cessation/primary prevention of drug use and availability of treatment for abuse.   Diet: Encouraged to adjust caloric intake to maintain  or achieve ideal body weight, to reduce intake of dietary saturated fat and total fat, to limit sodium intake by avoiding high sodium foods and not adding table salt, and to maintain adequate dietary potassium and calcium preferably from fresh  fruits, vegetables, and low-fat dairy products.    Stressed the importance of regular exercise  Injury prevention: Discussed safety belts, safety helmets, smoke detector, smoking near bedding or upholstery.   Dental health: Discussed importance of regular tooth brushing, flossing, and dental visits.    NEXT PREVENTATIVE PHYSICAL DUE IN 1 YEAR. Return in about 6 weeks (around 12/06/2021) for Anxiety + 3 months for ADHD.

## 2021-10-25 NOTE — Assessment & Plan Note (Addendum)
Noted on past labs, check lipid panel today and continue diet focus at home. The 10-year ASCVD risk score (Arnett DK, et al., 2019) is: 0.5%   Values used to calculate the score:     Age: 41 years     Sex: Female     Is Non-Hispanic African American: No     Diabetic: No     Tobacco smoker: No     Systolic Blood Pressure: 118 mmHg     Is BP treated: No     HDL Cholesterol: 54 mg/dL     Total Cholesterol: 177 mg/dL

## 2021-10-25 NOTE — Assessment & Plan Note (Signed)
Chronic, ongoing.  UDS performed today and controlled subs agreement up to date.  Will send 30 day supply + 2 other 30 day supplies predated for future.  She agrees to every 3 month visits, can be virtual if needed.  Her goal in long run is to transition to Effexor only if possible and come off Adderall, discussed this at length with her and recommend no cold [place] stopping but can work on gradual reduction over time with addition of Effexor.

## 2021-10-25 NOTE — Assessment & Plan Note (Addendum)
Exacerbated due to recent stressors. At this time denies SI/HI.  Increase Effexor XR to 150 MG daily at this time and monitor will reduce back down if needed to + script for Vistaril as needed only for panic.  Her goal in long run is to transition to Effexor only if possible and come off Adderall, discussed this at length with her and recommend no cold [place] stopping but can work on gradual reduction over time with addition of Effexor.  Return in 6 weeks for mood check.

## 2021-10-25 NOTE — Assessment & Plan Note (Signed)
In mother, passed at age 43.  Will plan on monitoring labs yearly and as needed if symptoms.

## 2021-10-26 ENCOUNTER — Other Ambulatory Visit (HOSPITAL_COMMUNITY): Payer: Self-pay

## 2021-10-26 LAB — COMPREHENSIVE METABOLIC PANEL
ALT: 21 IU/L (ref 0–32)
AST: 21 IU/L (ref 0–40)
Albumin/Globulin Ratio: 2.1 (ref 1.2–2.2)
Albumin: 4.9 g/dL (ref 3.9–4.9)
Alkaline Phosphatase: 36 IU/L — ABNORMAL LOW (ref 44–121)
BUN/Creatinine Ratio: 16 (ref 9–23)
BUN: 11 mg/dL (ref 6–24)
Bilirubin Total: <0.2 mg/dL (ref 0.0–1.2)
CO2: 20 mmol/L (ref 20–29)
Calcium: 10 mg/dL (ref 8.7–10.2)
Chloride: 97 mmol/L (ref 96–106)
Creatinine, Ser: 0.7 mg/dL (ref 0.57–1.00)
Globulin, Total: 2.3 g/dL (ref 1.5–4.5)
Glucose: 91 mg/dL (ref 70–99)
Potassium: 4.2 mmol/L (ref 3.5–5.2)
Sodium: 134 mmol/L (ref 134–144)
Total Protein: 7.2 g/dL (ref 6.0–8.5)
eGFR: 110 mL/min/{1.73_m2} (ref >59)

## 2021-10-26 LAB — CBC WITH DIFFERENTIAL/PLATELET
Basophils Absolute: 0.1 10*3/uL (ref 0.0–0.2)
Basos: 1 %
EOS (ABSOLUTE): 0.1 10*3/uL (ref 0.0–0.4)
Eos: 2 %
Hematocrit: 37.2 % (ref 34.0–46.6)
Hemoglobin: 12.8 g/dL (ref 11.1–15.9)
Immature Grans (Abs): 0 10*3/uL (ref 0.0–0.1)
Immature Granulocytes: 0 %
Lymphocytes Absolute: 2.4 10*3/uL (ref 0.7–3.1)
Lymphs: 39 %
MCH: 32.7 pg (ref 26.6–33.0)
MCHC: 34.4 g/dL (ref 31.5–35.7)
MCV: 95 fL (ref 79–97)
Monocytes Absolute: 0.6 10*3/uL (ref 0.1–0.9)
Monocytes: 10 %
Neutrophils Absolute: 3 10*3/uL (ref 1.4–7.0)
Neutrophils: 48 %
Platelets: 305 10*3/uL (ref 150–450)
RBC: 3.92 x10E6/uL (ref 3.77–5.28)
RDW: 11.7 % (ref 11.7–15.4)
WBC: 6.3 10*3/uL (ref 3.4–10.8)

## 2021-10-26 LAB — LIPASE: Lipase: 57 U/L (ref 14–72)

## 2021-10-26 LAB — LIPID PANEL W/O CHOL/HDL RATIO
Cholesterol, Total: 205 mg/dL — ABNORMAL HIGH (ref 100–199)
HDL: 57 mg/dL (ref >39)
LDL Chol Calc (NIH): 124 mg/dL — ABNORMAL HIGH (ref 0–99)
Triglycerides: 137 mg/dL (ref 0–149)
VLDL Cholesterol Cal: 24 mg/dL (ref 5–40)

## 2021-10-26 LAB — AMYLASE: Amylase: 71 U/L (ref 31–110)

## 2021-10-26 LAB — TSH: TSH: 2.45 u[IU]/mL (ref 0.450–4.500)

## 2021-10-26 NOTE — Progress Notes (Signed)
Contacted via MyChart   Good morning Shirley Rollins, only CBC has returned at this time and it is all nice and normal.  Will let you know when the rest return.  Any questions?

## 2021-10-26 NOTE — Progress Notes (Signed)
Contacted via MyChart   Good afternoon Shirley Rollins, remainder of labs have returned and are overall stable with exception of cholesterol.  Your cholesterol is high, but recommendations to make lifestyle changes. Your LDL is above normal. The LDL is the bad cholesterol. Over time and in combination with inflammation and other factors, this contributes to plaque which in turn may lead to stroke and/or heart attack down the road. Sometimes high LDL is primarily genetic, and people might be eating all the right foods but still have high numbers. Other times, there is room for improvement in one's diet and eating healthier can bring this number down and potentially reduce one's risk of heart attack and/or stroke.   To reduce your LDL, Remember - more fruits and vegetables, more fish, and limit red meat and dairy products. More soy, nuts, beans, barley, lentils, oats and plant sterol ester enriched margarine instead of butter. I also encourage eliminating sugar and processed food. Remember, shop on the outside of the grocery store and visit your [employer]. If you would like to talk with me about dietary changes plus or minus medications for your cholesterol, please let me know. We should recheck your cholesterol in 12 months.  Any questions? Keep being amazing!!  Thank you for allowing me to participate in your care.  I appreciate you. Kindest regards, Shelsy Seng

## 2021-10-27 LAB — DRUG SCREEN 764883 11+OXYCO+ALC+CRT-BUND
Amphetamines, Urine: NEGATIVE ng/mL
BENZODIAZ UR QL: NEGATIVE ng/mL
Barbiturate: NEGATIVE ng/mL
Cannabinoid Quant, Ur: NEGATIVE ng/mL
Cocaine (Metabolite): NEGATIVE ng/mL
Creatinine: 11.5 mg/dL — ABNORMAL LOW (ref 20.0–300.0)
Ethanol: NEGATIVE %
Meperidine: NEGATIVE ng/mL
Methadone Screen, Urine: NEGATIVE ng/mL
OPIATE SCREEN URINE: NEGATIVE ng/mL
Oxycodone/Oxymorphone, Urine: NEGATIVE ng/mL
Phencyclidine: NEGATIVE ng/mL
Propoxyphene: NEGATIVE ng/mL
Tramadol: NEGATIVE ng/mL
pH, Urine: 6.1 (ref 4.5–8.9)

## 2021-10-27 LAB — SPECIFIC GRAVITY: Specific Gravity: 1.0028

## 2021-11-02 ENCOUNTER — Ambulatory Visit
Admission: RE | Admit: 2021-11-02 | Discharge: 2021-11-02 | Disposition: A | Discharge status: Home / Self Care | Payer: No Typology Code available for payment source | Source: Ambulatory Visit | Attending: Nurse Practitioner | Admitting: Nurse Practitioner

## 2021-11-02 DIAGNOSIS — Z1231 Encounter for screening mammogram for malignant neoplasm of breast: Secondary | ICD-10-CM | POA: Diagnosis not present

## 2021-11-03 NOTE — Progress Notes (Signed)
Please let patient know her Mammogram did not show any evidence of a malignancy.  The recommendation is to repeat the Mammogram in 1 year.  

## 2021-11-07 ENCOUNTER — Other Ambulatory Visit (HOSPITAL_COMMUNITY): Payer: Self-pay

## 2021-12-07 ENCOUNTER — Other Ambulatory Visit (HOSPITAL_COMMUNITY): Payer: Self-pay

## 2022-01-08 ENCOUNTER — Other Ambulatory Visit (HOSPITAL_COMMUNITY): Payer: Self-pay

## 2022-02-08 ENCOUNTER — Other Ambulatory Visit (HOSPITAL_COMMUNITY): Payer: Self-pay

## 2022-02-08 ENCOUNTER — Other Ambulatory Visit: Payer: Self-pay | Admitting: Nurse Practitioner

## 2022-02-08 NOTE — Telephone Encounter (Signed)
Requested medications are due for refill today.  yes  Requested medications are on the active medications list.  yes  Last refill. 01/03/2022 #30 0 rf  Future visit scheduled.   no  Notes to clinic.  Refill not delegated.    Requested Prescriptions  Pending Prescriptions Disp Refills   amphetamine-dextroamphetamine (ADDERALL XR) 25 MG 24 hr capsule 30 capsule 0    Sig: Take 1 capsule by mouth every morning.     Not Delegated - Psychiatry:  Stimulants/ADHD Failed - 02/08/2022  1:06 PM      Failed - This refill cannot be delegated      Passed - Urine Drug Screen completed in last 360 days      Passed - Last BP in normal range    BP Readings from Last 1 Encounters:  10/25/21 118/82         Passed - Last Heart Rate in normal range    Pulse Readings from Last 1 Encounters:  10/25/21 84         Passed - Valid encounter within last 6 months    Recent Outpatient Visits           3 months ago ADD (attention deficit disorder) without hyperactivity   [facility] [place], [place] T, NP   6 months ago Anxiety and depression   [facility] Larae Grooms, NP   9 months ago Anxiety and depression   [facility] [place], [place] T, NP   1 year ago ADD (attention deficit disorder) without hyperactivity   [facility] [place], Dorie Rank, NP   1 year ago Anxiety and depression   [facility] [place], Dorie Rank, NP

## 2022-02-09 ENCOUNTER — Other Ambulatory Visit (HOSPITAL_COMMUNITY): Payer: Self-pay

## 2022-02-09 MED ORDER — AMPHETAMINE-DEXTROAMPHET ER 25 MG PO CP24
25.0000 mg | ORAL_CAPSULE | Freq: Every morning | ORAL | 0 refills | Status: DC
Start: 2022-02-09 — End: 2022-03-22
  Filled 2022-02-09: qty 30, 30d supply, fill #0

## 2022-02-10 ENCOUNTER — Other Ambulatory Visit (HOSPITAL_COMMUNITY): Payer: Self-pay

## 2022-02-14 ENCOUNTER — Other Ambulatory Visit (HOSPITAL_COMMUNITY): Payer: Self-pay

## 2022-03-22 ENCOUNTER — Other Ambulatory Visit (HOSPITAL_COMMUNITY): Payer: Self-pay

## 2022-03-22 ENCOUNTER — Other Ambulatory Visit: Payer: Self-pay | Admitting: Nurse Practitioner

## 2022-03-22 NOTE — Telephone Encounter (Signed)
Patient is overdue for a follow up appointment. Please call to schedule.  

## 2022-03-22 NOTE — Telephone Encounter (Signed)
Requested medication (s) are due for refill today: yes  Requested medication (s) are on the active medication list: yes  Last refill:  02/09/22 #30/0  Future visit scheduled: no  Notes to clinic:  Unable to refill per protocol, cannot delegate.    Requested Prescriptions  Pending Prescriptions Disp Refills   amphetamine-dextroamphetamine (ADDERALL XR) 25 MG 24 hr capsule 30 capsule 0    Sig: Take 1 capsule (25 mg) by mouth every morning.     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

## 2022-03-23 NOTE — Telephone Encounter (Signed)
LVM asking patient to call back and schedule an appointment.  

## 2022-03-27 ENCOUNTER — Other Ambulatory Visit (HOSPITAL_COMMUNITY): Payer: Self-pay

## 2022-03-27 ENCOUNTER — Encounter: Payer: Self-pay | Admitting: Nurse Practitioner

## 2022-03-27 MED ORDER — AMPHETAMINE-DEXTROAMPHET ER 25 MG PO CP24
25.0000 mg | ORAL_CAPSULE | Freq: Every morning | ORAL | 0 refills | Status: DC
  Filled 2022-03-27: qty 30, 30d supply, fill #0

## 2022-03-27 NOTE — Telephone Encounter (Signed)
Pt scheduled next available appt, requesting refill beforehand.

## 2022-03-27 NOTE — Telephone Encounter (Signed)
Requested medication (s) are due for refill today: yes  Requested medication (s) are on the active medication list: yes  Last refill:  02/09/22 #30/0  Future visit scheduled: yes  Notes to clinic:  Unable to refill per protocol, cannot delegate.    Requested Prescriptions  Pending Prescriptions Disp Refills   amphetamine-dextroamphetamine (ADDERALL XR) 25 MG 24 hr capsule 30 capsule 0    Sig: Take 1 capsule (25 mg) by mouth every morning.     Not Delegated - Psychiatry:  Stimulants/ADHD Failed - 03/27/2022  9:49 AM      Failed - This refill cannot be delegated      Passed - Urine Drug Screen completed in last 360 days      Passed - Last BP in normal range    BP Readings from Last 1 Encounters:  10/25/21 118/82         Passed - Last Heart Rate in normal range    Pulse Readings from Last 1 Encounters:  10/25/21 84         Passed - Valid encounter within last 6 months    Recent Outpatient Visits           5 months ago ADD (attention deficit disorder) without hyperactivity   [place], Henrine Screws T, NP   7 months ago Anxiety and depression   [place], NP   10 months ago Anxiety and depression   [place], Henrine Screws T, NP   1 year ago ADD (attention deficit disorder) without hyperactivity   [place], Henrine Screws T, NP   1 year ago Anxiety and depression   [place], Barbaraann Faster, NP       Future Appointments             In 6 days Venita Lick, NP [place], [facility]

## 2022-03-31 NOTE — Patient Instructions (Signed)
Living With Attention Deficit Hyperactivity Disorder If you have been diagnosed with attention deficit hyperactivity disorder (ADHD), you may be relieved that you now know why you have felt or behaved a certain way. Still, you may feel overwhelmed about the treatment ahead. You may also wonder how to get the support you need and how to deal with the condition day-to-day. With treatment and support, you can live with ADHD and manage your symptoms. How to manage lifestyle changes Managing lifestyle changes can be challenging. Seeking support from your healthcare provider, therapist, family, and friends can be helpful. How to recognize changes in your condition The following signs may mean that your treatment is working well and your condition is improving: Consistently being on time for appointments. Being more organized at home and work. Other people noticing improvements in your behavior. Achieving goals that you set for yourself. Thinking more clearly. The following signs may mean that your treatment is not working very well: Feeling impatience or more confusion. Missing, forgetting, or being late for appointments. An increasing sense of disorganization and messiness. More difficulty in reaching goals that you set for yourself. Loved ones becoming angry or frustrated with you. Follow these instructions at home: Medicines Take over-the-counter and prescription medicines only as told by your health care provider. Check with your health care provider before taking any new medicines. General instructions Create structure and an organized atmosphere at home. For example: Make a list of tasks, then rank them from most important to least important. Work on one task at a time until your listed tasks are done. Make a daily schedule and follow it consistently every day. Use an appointment calendar, and check it 2-3 times a day to keep on track. Keep it with you when you leave the house. Create  spaces where you keep certain things, and always put things back in their places after you use them. Keep all follow-up visits. Your health care provider will need to monitor your condition and adjust your treatment over time. Where to find support Talking to others  Keep emotion out of important discussions and speak in a calm, logical way. Listen closely and patiently to your loved ones. Try to understand their point of view, and try to avoid getting defensive. Take responsibility for the consequences of your actions. Ask that others do not take your behaviors personally. Aim to solve problems as they come up, and express your feelings instead of bottling them up. Talk openly about what you need from your loved ones and how they can support you. Consider going to family therapy sessions or having your family meet with a specialist who deals with ADHD-related behavior problems. Finances Not all insurance plans cover mental health care, so it is important to check with your insurance carrier. If paying for co-pays or counseling services is a problem, search for a local or county mental health care center. Public mental health care services may be offered there at a low cost or no cost when you are not able to see a private health care provider. If you are taking medicine for ADHD, you may be able to get the generic form, which may be less expensive than brand-name medicine. Some makers of prescription medicines also offer help to patients who cannot afford the medicines that they need. Therapy and support groups Talking with a mental health care provider and participating in support groups can help to improve your quality of life, daily functioning, and overall symptoms. Questions to ask your health   care provider: What are the risks and benefits of taking medicines? Would I benefit from therapy? How often should I follow up with a health care provider? Where to find more information Learn more  about ADHD from: Children and Adults with Attention Deficit Hyperactivity Disorder: chadd.org National Institute of Mental Health: nimh.nih.gov Centers for Disease Control and Prevention: cdc.gov Contact a health care provider if: You have side effects from your medicines, such as: Repeated muscle twitches, coughing, or speech outbursts. Sleep problems. Loss of appetite. Dizziness. Unusually fast heartbeat. Stomach pains. Headaches. You have new or worsening behavior problems. You are struggling with anxiety, depression, or substance abuse. Get help right away if: You have a severe reaction to a medicine. These symptoms may be an emergency. Get help right away. Call 911. Do not wait to see if the symptoms will go away. Do not drive yourself to the hospital. Take one of these steps if you feel like you may hurt yourself or others, or have thoughts about taking your own life: Go to your nearest emergency room. Call 911. Call the National Suicide Prevention Lifeline at 1-800-273-8255 or 988. This is open 24 hours a day. Text the Crisis Text Line at 741741. Summary With treatment and support, you can live with ADHD and manage your symptoms. Consider taking part in family therapy or self-help groups with family members or friends. When you talk with friends and family about your ADHD, be patient and communicate openly. Keep all follow-up visits. Your health care provider will need to monitor your condition and adjust your treatment over time. This information is not intended to replace advice given to you by your health care provider. Make sure you discuss any questions you have with your health care provider. Document Revised: 06/02/2021 Document Reviewed: 06/02/2021 Elsevier Patient Education  2023 Elsevier Inc.  

## 2022-04-02 ENCOUNTER — Encounter: Payer: Self-pay | Admitting: Nurse Practitioner

## 2022-04-02 ENCOUNTER — Telehealth (INDEPENDENT_AMBULATORY_CARE_PROVIDER_SITE_OTHER): Payer: 59 | Admitting: Nurse Practitioner

## 2022-04-02 ENCOUNTER — Other Ambulatory Visit (HOSPITAL_COMMUNITY): Payer: Self-pay

## 2022-04-02 DIAGNOSIS — F419 Anxiety disorder, unspecified: Secondary | ICD-10-CM | POA: Diagnosis not present

## 2022-04-02 DIAGNOSIS — F32A Depression, unspecified: Secondary | ICD-10-CM | POA: Diagnosis not present

## 2022-04-02 DIAGNOSIS — F988 Other specified behavioral and emotional disorders with onset usually occurring in childhood and adolescence: Secondary | ICD-10-CM | POA: Diagnosis not present

## 2022-04-02 MED ORDER — AMPHETAMINE-DEXTROAMPHET ER 25 MG PO CP24
25.0000 mg | ORAL_CAPSULE | Freq: Every morning | ORAL | 0 refills | Status: DC
Start: 2022-05-02 — End: 2022-09-24
  Filled 2022-05-08: qty 30, 30d supply, fill #0

## 2022-04-02 MED ORDER — AMPHETAMINE-DEXTROAMPHET ER 25 MG PO CP24
ORAL_CAPSULE | Freq: Every morning | ORAL | 0 refills | Status: DC
  Filled 2022-04-02 – 2022-04-03 (×2): qty 30, 30d supply, fill #0

## 2022-04-02 MED ORDER — AMPHETAMINE-DEXTROAMPHET ER 25 MG PO CP24
25.0000 mg | ORAL_CAPSULE | ORAL | 0 refills | Status: DC
Start: 2022-06-01 — End: 2022-07-10
  Filled 2022-06-08: qty 30, 30d supply, fill #0

## 2022-04-02 NOTE — Assessment & Plan Note (Signed)
Chronic, ongoing.  UDS up to date and controlled subs agreement up to date.  Will send 30 day supply + 2 other 30 day supplies predated for future.  She agrees to every 3 month visits, can be virtual if needed.  Her goal in long run is to transition to Effexor only if possible and come off Adderall, discussed this at length with her and recommend no cold [place] stopping but can work on gradual reduction over time with addition of Effexor.

## 2022-04-02 NOTE — Assessment & Plan Note (Signed)
Chronic. At this time denies SI/HI.  Mood slightly exacerbated by being out of Adderall for 2 weeks at this time.  Continue Effexor XR 150 MG daily at this time + Vistaril as needed only for panic.  Her goal in long run is to transition to Effexor only if possible and come off Adderall, discussed this at length with her and recommend no cold [place] stopping but can work on gradual reduction over time with addition of Effexor.  Return in 3 months.

## 2022-04-02 NOTE — Progress Notes (Signed)
LVM asking patient to call back to schedule an appointment 

## 2022-04-02 NOTE — Progress Notes (Signed)
There were no vitals taken for this visit.   Subjective:    Patient ID: Shirley Rollins, female    DOB: 01/30/1981, 42 y.o.   MRN: [id number]  HPI: Shirley Rollins is a 42 y.o. female  Chief Complaint  Patient presents with   ADHD   Depression   This visit was completed via video visit through [facility] due to the restrictions of the COVID-19 pandemic. All issues as above were discussed and addressed. Physical exam was done as above through visual confirmation on video through MyChart. If it was felt that the patient should be evaluated in the office, they were directed there. The patient verbally consented to this visit. Location of the patient: home Location of the provider: work Those involved with this call:  Provider: Marnee Guarneri, DNP CMA: Frazier Butt, [place] Desk/Registration: [employer]  Time spent on call:  21 minutes with patient face to face via video conference. More than 50% of this time was spent in counseling and coordination of care. 15 minutes total spent in review of patient's record and preparation of their chart.  I verified patient identity using two factors (patient name and date of birth). Patient consents verbally to being seen via telemedicine visit today.    ADD She continues on Adderall XR 25 MG daily.  She been on this regimen for > 8 years and overall works well.  Last fill on PDMP review 02/10/22.    Has not had Adderall in over 2 weeks and her mood has been effected by this.   Status: stable Satisfied with current therapy: yes Medication compliance:  good compliance Controlled substance contract: yes Previous psychiatry evaluation: no Previous medications: yes adderall XR   Taking meds on weekends/vacations: yes Work/school performance:  good Difficulty sustaining attention/completing tasks:  occasional Distracted by extraneous stimuli: no Does not listen when spoken to: no  Fidgets with hands or feet: no Unable to stay in seat:  no Blurts out/interrupts others: no ADHD Medication Side Effects: no    Decreased appetite: no    Headache: no    Sleeping disturbance pattern: no    Irritability: no    Rebound effects (worse than baseline) off medication: no    Anxiousness: no    Dizziness: no    Tics: no    04/02/2022    8:42 AM 10/25/2021    4:06 PM 08/10/2021    9:31 AM 05/03/2021    4:31 PM 02/01/2021    4:22 PM  Depression screen PHQ 2/9  Decreased Interest 3 1 1 1 1   Down, Depressed, Hopeless 2 2 1 2 2   PHQ - 2 Score 5 3 2 3 3   Altered sleeping 3 1 1  0 2  Tired, decreased energy 3 3 0 3 2  Change in appetite 0 0 0 0 0  Feeling bad or failure about yourself  3 1 1 1 1   Trouble concentrating 3 2 0 1 1  Moving slowly or fidgety/restless 0 0 0 0 0  Suicidal thoughts 0 0 0 0 0  PHQ-9 Score 17 10 4 8 9   Difficult doing work/chores Somewhat difficult Somewhat difficult Not difficult at all Somewhat difficult Somewhat difficult       04/02/2022    8:43 AM 10/25/2021    4:07 PM 08/10/2021    9:32 AM 05/03/2021    4:32 PM  GAD 7 : Generalized Anxiety Score  Nervous, Anxious, on Edge 3 3 2 2   Control/stop worrying 3 3  1 2  Worry too much - different things 3 1 2 1   Trouble relaxing 3 2 1 1   Restless 3 1 1  0  Easily annoyed or irritable 3 3 2 2   Afraid - awful might happen 3 1 0 1  Total GAD 7 Score 21 14 9 9   Anxiety Difficulty Somewhat difficult Somewhat difficult Not difficult at all Somewhat difficult     Relevant past medical, surgical, family and social history reviewed and updated as indicated. Interim medical history since our last visit reviewed. Allergies and medications reviewed and updated.  Review of Systems  Constitutional:  Negative for activity change, appetite change, diaphoresis, fatigue and fever.  Respiratory:  Negative for cough, chest tightness and shortness of breath.   Cardiovascular:  Negative for chest pain, palpitations and leg swelling.  Gastrointestinal: Negative.    Psychiatric/Behavioral:  Positive for decreased concentration and sleep disturbance. Negative for self-injury and suicidal ideas. The patient is nervous/anxious.    Per HPI unless specifically indicated above     Objective:    There were no vitals taken for this visit.  Wt Readings from Last 3 Encounters:  10/25/21 113 lb 6.4 oz (51.4 kg)  10/04/21 124 lb (56.2 kg)  05/03/21 124 lb 9.6 oz (56.5 kg)    Physical Exam Vitals and nursing note reviewed.  Constitutional:      General: She is awake. She is not in acute distress.    Appearance: She is well-developed. She is not ill-appearing.  HENT:     Head: Normocephalic.     Right Ear: Hearing normal.     Left Ear: Hearing normal.  Eyes:     General: Lids are normal.        Right eye: No discharge.        Left eye: No discharge.     Conjunctiva/sclera: Conjunctivae normal.  Pulmonary:     Effort: Pulmonary effort is normal. No accessory muscle usage or respiratory distress.  Musculoskeletal:     Cervical back: Normal range of motion.  Neurological:     Mental Status: She is alert and oriented to person, place, and time.  Psychiatric:        Attention and Perception: Attention normal.        Mood and Affect: Mood normal.        Behavior: Behavior normal. Behavior is cooperative.        Thought Content: Thought content normal.        Judgment: Judgment normal.    Results for orders placed or performed in visit on 10/25/21  CBC with Differential/Platelet  Result Value Ref Range   WBC 6.3 3.4 - 10.8 x10E3/uL   RBC 3.92 3.77 - 5.28 x10E6/uL   Hemoglobin 12.8 11.1 - 15.9 g/dL   Hematocrit 37.2 34.0 - 46.6 %   MCV 95 79 - 97 fL   MCH 32.7 26.6 - 33.0 pg   MCHC 34.4 31.5 - 35.7 g/dL   RDW 11.7 11.7 - 15.4 %   Platelets 305 150 - 450 x10E3/uL   Neutrophils 48 Not Estab. %   Lymphs 39 Not Estab. %   Monocytes 10 Not Estab. %   Eos 2 Not Estab. %   Basos 1 Not Estab. %   Neutrophils Absolute 3.0 1.4 - 7.0 x10E3/uL    Lymphocytes Absolute 2.4 0.7 - 3.1 x10E3/uL   Monocytes Absolute 0.6 0.1 - 0.9 x10E3/uL   EOS (ABSOLUTE) 0.1 0.0 - 0.4 x10E3/uL   Basophils Absolute 0.1 0.0 -  0.2 x10E3/uL   Immature Granulocytes 0 Not Estab. %   Immature Grans (Abs) 0.0 0.0 - 0.1 x10E3/uL  Comprehensive metabolic panel  Result Value Ref Range   Glucose 91 70 - 99 mg/dL   BUN 11 6 - 24 mg/dL   Creatinine, Ser 0.70 0.57 - 1.00 mg/dL   eGFR 110 >59 mL/min/1.73   BUN/Creatinine Ratio 16 9 - 23   Sodium 134 134 - 144 mmol/L   Potassium 4.2 3.5 - 5.2 mmol/L   Chloride 97 96 - 106 mmol/L   CO2 20 20 - 29 mmol/L   Calcium 10.0 8.7 - 10.2 mg/dL   Total Protein 7.2 6.0 - 8.5 g/dL   Albumin 4.9 3.9 - 4.9 g/dL   Globulin, Total 2.3 1.5 - 4.5 g/dL   Albumin/Globulin Ratio 2.1 1.2 - 2.2   Bilirubin Total <0.2 0.0 - 1.2 mg/dL   Alkaline Phosphatase 36 (L) 44 - 121 IU/L   AST 21 0 - 40 IU/L   ALT 21 0 - 32 IU/L  Lipid Panel w/o Chol/HDL Ratio  Result Value Ref Range   Cholesterol, Total 205 (H) 100 - 199 mg/dL   Triglycerides 137 0 - 149 mg/dL   HDL 57 >39 mg/dL   VLDL Cholesterol Cal 24 5 - 40 mg/dL   LDL Chol Calc (NIH) 124 (H) 0 - 99 mg/dL  TSH  Result Value Ref Range   TSH 2.450 0.450 - 4.500 uIU/mL  [id number] 11+Oxyco+Alc+Crt-Bund  Result Value Ref Range   Ethanol Negative Cutoff=0.020 %   Amphetamines, Urine Negative Cutoff=1000 ng/mL   Barbiturate Negative Cutoff=200 ng/mL   BENZODIAZ UR QL Negative Cutoff=200 ng/mL   Cannabinoid Quant, Ur Negative Cutoff=50 ng/mL   Cocaine (Metabolite) Negative Cutoff=300 ng/mL   OPIATE SCREEN URINE Negative Cutoff=300 ng/mL   Oxycodone/Oxymorphone, Urine Negative Cutoff=300 ng/mL   Phencyclidine Negative Cutoff=25 ng/mL   Methadone Screen, Urine Negative Cutoff=300 ng/mL   Propoxyphene Negative Cutoff=300 ng/mL   Meperidine Negative Cutoff=200 ng/mL   Tramadol Negative Cutoff=200 ng/mL   Creatinine 11.5 (L) 20.0 - 300.0 mg/dL   pH, Urine 6.1 4.5 - 8.9  Lipase  Result  Value Ref Range   Lipase 57 14 - 72 U/L  Amylase  Result Value Ref Range   Amylase 71 31 - 110 U/L  Specific Gravity  Result Value Ref Range   Specific Gravity 1.0028       Assessment & Plan:   Problem List Items Addressed This Visit       Other   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  UDS up to date and controlled subs agreement up to date.  Will send 30 day supply + 2 other 30 day supplies predated for future.  She agrees to every 3 month visits, can be virtual if needed.  Her goal in long run is to transition to Effexor only if possible and come off Adderall, discussed this at length with her and recommend no cold [place] stopping but can work on gradual reduction over time with addition of Effexor.      Relevant Medications   amphetamine-dextroamphetamine (ADDERALL XR) 25 MG 24 hr capsule   Anxiety and depression    Chronic. At this time denies SI/HI.  Mood slightly exacerbated by being out of Adderall for 2 weeks at this time.  Continue Effexor XR 150 MG daily at this time + Vistaril as needed only for panic.  Her goal in long run is to transition  to Effexor only if possible and come off Adderall, discussed this at length with her and recommend no cold [place] stopping but can work on gradual reduction over time with addition of Effexor.  Return in 3 months.       I discussed the assessment and treatment plan with the patient. The patient was provided an opportunity to ask questions and all were answered. The patient agreed with the plan and demonstrated an understanding of the instructions.   The patient was advised to call back or seek an in-person evaluation if the symptoms worsen or if the condition fails to improve as anticipated.   I provided 21+ minutes of time during this encounter.    Follow up plan: Return in about 3 months (around 07/01/2022) for ADD.

## 2022-04-03 ENCOUNTER — Other Ambulatory Visit (HOSPITAL_COMMUNITY): Payer: Self-pay

## 2022-05-08 ENCOUNTER — Other Ambulatory Visit (HOSPITAL_COMMUNITY): Payer: Self-pay

## 2022-06-08 ENCOUNTER — Other Ambulatory Visit: Payer: Self-pay

## 2022-06-08 ENCOUNTER — Other Ambulatory Visit (HOSPITAL_COMMUNITY): Payer: Self-pay

## 2022-07-10 ENCOUNTER — Other Ambulatory Visit: Payer: Self-pay | Admitting: Nurse Practitioner

## 2022-07-10 ENCOUNTER — Other Ambulatory Visit (HOSPITAL_COMMUNITY): Payer: Self-pay

## 2022-07-10 NOTE — Telephone Encounter (Signed)
Requested medication (s) are due for refill today: Yes  Requested medication (s) are on the active medication list: Yes  Last refill:  06/01/22  Future visit scheduled: No  Notes to clinic:  See request.    Requested Prescriptions  Pending Prescriptions Disp Refills   amphetamine-dextroamphetamine (ADDERALL XR) 25 MG 24 hr capsule 30 capsule 0    Sig: Take 1 capsule by mouth every morning.     Not Delegated - Psychiatry:  Stimulants/ADHD Failed - 07/10/2022  1:18 PM      Failed - This refill cannot be delegated      Passed - Urine Drug Screen completed in last 360 days      Passed - Last BP in normal range    BP Readings from Last 1 Encounters:  10/25/21 118/82         Passed - Last Heart Rate in normal range    Pulse Readings from Last 1 Encounters:  10/25/21 84         Passed - Valid encounter within last 6 months    Recent Outpatient Visits           3 months ago ADD (attention deficit disorder) without hyperactivity   [place] [facility] [place], Corrie Dandy T, NP   8 months ago ADD (attention deficit disorder) without hyperactivity   [place] [facility] [place], Corrie Dandy T, NP   11 months ago Anxiety and depression   [place] [facility] Larae Grooms, NP   1 year ago Anxiety and depression   [place] [facility] [place], Corrie Dandy T, NP   1 year ago ADD (attention deficit disorder) without hyperactivity   [place] [facility] [place], Dorie Rank, NP

## 2022-07-11 ENCOUNTER — Other Ambulatory Visit (HOSPITAL_COMMUNITY): Payer: Self-pay

## 2022-07-11 MED ORDER — AMPHETAMINE-DEXTROAMPHET ER 25 MG PO CP24
25.0000 mg | ORAL_CAPSULE | ORAL | 0 refills | Status: DC
  Filled 2022-07-11: qty 30, 30d supply, fill #0

## 2022-07-12 ENCOUNTER — Other Ambulatory Visit (HOSPITAL_COMMUNITY): Payer: Self-pay

## 2022-08-14 ENCOUNTER — Other Ambulatory Visit: Payer: Self-pay | Admitting: Nurse Practitioner

## 2022-08-14 ENCOUNTER — Other Ambulatory Visit (HOSPITAL_COMMUNITY): Payer: Self-pay

## 2022-08-14 MED ORDER — AMPHETAMINE-DEXTROAMPHET ER 25 MG PO CP24
25.0000 mg | ORAL_CAPSULE | ORAL | 0 refills | Status: DC
Start: 2022-08-14 — End: 2022-09-17
  Filled 2022-08-14: qty 30, 30d supply, fill #0

## 2022-08-14 NOTE — Telephone Encounter (Signed)
Requested medication (s) are due for refill today: yes  Requested medication (s) are on the active medication list: yes  Last refill:  07/11/22 #30  Future visit scheduled:yes  Notes to clinic:  med not delegated to NT to RF   Requested Prescriptions  Pending Prescriptions Disp Refills   amphetamine-dextroamphetamine (ADDERALL XR) 25 MG 24 hr capsule 30 capsule 0    Sig: Take 1 capsule by mouth every morning. Will need visit for further refills.     Not Delegated - Psychiatry:  Stimulants/ADHD Failed - 08/14/2022  1:01 PM      Failed - This refill cannot be delegated      Passed - Urine Drug Screen completed in last 360 days      Passed - Last BP in normal range    BP Readings from Last 1 Encounters:  10/25/21 118/82         Passed - Last Heart Rate in normal range    Pulse Readings from Last 1 Encounters:  10/25/21 84         Passed - Valid encounter within last 6 months    Recent Outpatient Visits           4 months ago ADD (attention deficit disorder) without hyperactivity   [place] [facility] [place], Corrie Dandy T, NP   9 months ago ADD (attention deficit disorder) without hyperactivity   [place] [facility] [place], Corrie Dandy T, NP   1 year ago Anxiety and depression   [place] [facility] Larae Grooms, NP   1 year ago Anxiety and depression   [place] [facility] [place], Dorie Rank, NP   1 year ago ADD (attention deficit disorder) without hyperactivity   [place] [facility] [place], Dorie Rank, NP       Future Appointments             In 2 months [place], Dorie Rank, NP [place] [facility]

## 2022-09-17 ENCOUNTER — Other Ambulatory Visit (HOSPITAL_COMMUNITY): Payer: Self-pay

## 2022-09-17 ENCOUNTER — Other Ambulatory Visit: Payer: Self-pay | Admitting: Nurse Practitioner

## 2022-09-17 ENCOUNTER — Encounter: Payer: Self-pay | Admitting: Nurse Practitioner

## 2022-09-17 MED ORDER — AMPHETAMINE-DEXTROAMPHET ER 25 MG PO CP24
25.0000 mg | ORAL_CAPSULE | ORAL | 0 refills | Status: DC
  Filled 2022-09-17: qty 20, 20d supply, fill #0
  Filled 2022-09-18: qty 10, 10d supply, fill #0

## 2022-09-18 ENCOUNTER — Other Ambulatory Visit: Payer: Self-pay

## 2022-09-18 ENCOUNTER — Other Ambulatory Visit (HOSPITAL_COMMUNITY): Payer: Self-pay

## 2022-09-22 NOTE — Patient Instructions (Signed)
Living With Attention Deficit Hyperactivity Disorder If you have been diagnosed with attention deficit hyperactivity disorder (ADHD), you may be relieved that you now know why you have felt or behaved a certain way. Still, you may feel overwhelmed about the treatment ahead. You may also wonder how to get the support you need and how to deal with the condition day-to-day. With treatment and support, you can live with ADHD and manage your symptoms. How to manage lifestyle changes Managing lifestyle changes can be challenging. Seeking support from your healthcare provider, therapist, family, and friends can be helpful. How to recognize changes in your condition The following signs may mean that your treatment is working well and your condition is improving: Consistently being on time for appointments. Being more organized at home and work. Other people noticing improvements in your behavior. Achieving goals that you set for yourself. Thinking more clearly. The following signs may mean that your treatment is not working very well: Feeling impatience or more confusion. Missing, forgetting, or being late for appointments. An increasing sense of disorganization and messiness. More difficulty in reaching goals that you set for yourself. Loved ones becoming angry or frustrated with you. Follow these instructions at home: Medicines Take over-the-counter and prescription medicines only as told by your health care provider. Check with your health care provider before taking any new medicines. General instructions Create structure and an organized atmosphere at home. For example: Make a list of tasks, then rank them from most important to least important. Work on one task at a time until your listed tasks are done. Make a daily schedule and follow it consistently every day. Use an appointment calendar, and check it 2-3 times a day to keep on track. Keep it with you when you leave the house. Create  spaces where you keep certain things, and always put things back in their places after you use them. Keep all follow-up visits. Your health care provider will need to monitor your condition and adjust your treatment over time. Where to find support Talking to others  Keep emotion out of important discussions and speak in a calm, logical way. Listen closely and patiently to your loved ones. Try to understand their point of view, and try to avoid getting defensive. Take responsibility for the consequences of your actions. Ask that others do not take your behaviors personally. Aim to solve problems as they come up, and express your feelings instead of bottling them up. Talk openly about what you need from your loved ones and how they can support you. Consider going to family therapy sessions or having your family meet with a specialist who deals with ADHD-related behavior problems. Finances Not all insurance plans cover mental health care, so it is important to check with your insurance carrier. If paying for co-pays or counseling services is a problem, search for a local or county mental health care center. Public mental health care services may be offered there at a low cost or no cost when you are not able to see a private health care provider. If you are taking medicine for ADHD, you may be able to get the generic form, which may be less expensive than brand-name medicine. Some makers of prescription medicines also offer help to patients who cannot afford the medicines that they need. Therapy and support groups Talking with a mental health care provider and participating in support groups can help to improve your quality of life, daily functioning, and overall symptoms. Questions to ask your health   care provider: What are the risks and benefits of taking medicines? Would I benefit from therapy? How often should I follow up with a health care provider? Where to find more information Learn more  about ADHD from: Children and Adults with Attention Deficit Hyperactivity Disorder: chadd.org National Institute of Mental Health: nimh.nih.gov Centers for Disease Control and Prevention: cdc.gov Contact a health care provider if: You have side effects from your medicines, such as: Repeated muscle twitches, coughing, or speech outbursts. Sleep problems. Loss of appetite. Dizziness. Unusually fast heartbeat. Stomach pains. Headaches. You have new or worsening behavior problems. You are struggling with anxiety, depression, or substance abuse. Get help right away if: You have a severe reaction to a medicine. These symptoms may be an emergency. Get help right away. Call 911. Do not wait to see if the symptoms will go away. Do not drive yourself to the hospital. Take one of these steps if you feel like you may hurt yourself or others, or have thoughts about taking your own life: Go to your nearest emergency room. Call 911. Call the National Suicide Prevention Lifeline at 1-800-273-8255 or 988. This is open 24 hours a day. Text the Crisis Text Line at 741741. Summary With treatment and support, you can live with ADHD and manage your symptoms. Consider taking part in family therapy or self-help groups with family members or friends. When you talk with friends and family about your ADHD, be patient and communicate openly. Keep all follow-up visits. Your health care provider will need to monitor your condition and adjust your treatment over time. This information is not intended to replace advice given to you by your health care provider. Make sure you discuss any questions you have with your health care provider. Document Revised: 06/02/2021 Document Reviewed: 06/02/2021 Elsevier Patient Education  2024 Elsevier Inc.  

## 2022-09-24 ENCOUNTER — Encounter: Payer: Self-pay | Admitting: Nurse Practitioner

## 2022-09-24 ENCOUNTER — Other Ambulatory Visit (HOSPITAL_COMMUNITY): Payer: Self-pay

## 2022-09-24 ENCOUNTER — Telehealth (INDEPENDENT_AMBULATORY_CARE_PROVIDER_SITE_OTHER): Payer: 59 | Admitting: Nurse Practitioner

## 2022-09-24 DIAGNOSIS — F32A Depression, unspecified: Secondary | ICD-10-CM | POA: Diagnosis not present

## 2022-09-24 DIAGNOSIS — F988 Other specified behavioral and emotional disorders with onset usually occurring in childhood and adolescence: Secondary | ICD-10-CM | POA: Diagnosis not present

## 2022-09-24 DIAGNOSIS — F419 Anxiety disorder, unspecified: Secondary | ICD-10-CM

## 2022-09-24 MED ORDER — AMPHETAMINE-DEXTROAMPHET ER 25 MG PO CP24
25.0000 mg | ORAL_CAPSULE | Freq: Every morning | ORAL | 0 refills | Status: DC
  Filled 2022-10-19: qty 30, fill #0
  Filled 2022-10-19: qty 30, 30d supply, fill #0

## 2022-09-24 MED ORDER — AMPHETAMINE-DEXTROAMPHET ER 25 MG PO CP24
25.0000 mg | ORAL_CAPSULE | ORAL | 0 refills | Status: DC
Start: 2022-11-16 — End: 2023-01-28
  Filled 2022-11-21: qty 30, 30d supply, fill #0

## 2022-09-24 MED ORDER — AMPHETAMINE-DEXTROAMPHET ER 25 MG PO CP24
25.0000 mg | ORAL_CAPSULE | Freq: Every morning | ORAL | 0 refills | Status: DC
  Filled 2022-12-26: qty 30, 30d supply, fill #0

## 2022-09-24 NOTE — Assessment & Plan Note (Signed)
Chronic, ongoing.  UDS due 10/26/22 and controlled subs agreement up to date.  Will send 30 day supply + 2 other 30 day supplies predated for future.  Agrees to every 3 month visits, can be virtual if needed.  Her goal in long run is to transition to Effexor only if possible and come off Adderall, discussed this at length with her and recommend no cold [place] stopping but can work on gradual reduction over time with addition of Effexor.

## 2022-09-24 NOTE — Assessment & Plan Note (Signed)
Chronic. At this time denies SI/HI.  Mood slightly exacerbated by work stressors.  Continue Effexor XR 150 MG daily at this time + Vistaril as needed only for panic.  Her goal in long run is to transition to Effexor only if possible and come off Adderall, discussed this at length with her and recommend no cold [place] stopping but can work on gradual reduction over time with addition of Effexor.  Discussed adding on therapy or visit with psychiatry if worsening mood.

## 2022-09-24 NOTE — Progress Notes (Signed)
There were no vitals taken for this visit.   Subjective:    Patient ID: Shirley Rollins, female    DOB: 12/21/1980, 42 y.o.   MRN: [id number]  HPI: Shirley Rollins is a 42 y.o. female  Chief Complaint  Patient presents with   ADHD    Medication check in and refill   This visit was completed via video visit through MyChart due to the restrictions of the COVID-19 pandemic. All issues as above were discussed and addressed. Physical exam was done as above through visual confirmation on video through MyChart. If it was felt that the patient should be evaluated in the office, they were directed there. The patient verbally consented to this visit. Location of the patient: home Location of the provider: work Those involved with this call:  Provider: Aura Dials, DNP CMA: Tristan Schroeder, CMA Front Desk/Registration: [employer]  Time spent on call:  21 minutes with patient face to face via video conference. More than 50% of this time was spent in counseling and coordination of care. 15 minutes total spent in review of patient's record and preparation of their chart.  I verified patient identity using two factors (patient name and date of birth). Patient consents verbally to being seen via telemedicine visit today.    ADD She continues on Adderall XR 25 MG daily.  She been on this regimen for > 9 years and overall works well.  Last fill on PDMP review 09/18/22.  She is not sure if it is working anymore, but does have increased stressors with work.  When she gets home is emotionally done for the day.  Is procrastinating more.  Is currently on vacation and feels she needed the self care time.  Continues on Effexor XR 150 MG daily for mood + Vistaril as needed. Status: stable Satisfied with current therapy: yes Medication compliance:  good compliance Controlled substance contract: yes Previous psychiatry evaluation: no Previous medications: yes adderall XR   Taking meds on  weekends/vacations: yes Work/school performance:  good Difficulty sustaining attention/completing tasks: occasional Distracted by extraneous stimuli: yes Does not listen when spoken to: no  Fidgets with hands or feet: no Unable to stay in seat: no Blurts out/interrupts others: no ADHD Medication Side Effects: no    Decreased appetite: no    Headache: no    Sleeping disturbance pattern: no    Irritability: no    Rebound effects (worse than baseline) off medication: no    Anxiousness: no    Dizziness: no    Tics: no    09/24/2022    8:57 AM 04/02/2022    8:42 AM 10/25/2021    4:06 PM 08/10/2021    9:31 AM 05/03/2021    4:31 PM  Depression screen PHQ 2/9  Decreased Interest 3 3 1 1 1   Down, Depressed, Hopeless 1 2 2 1 2   PHQ - 2 Score 4 5 3 2 3   Altered sleeping 1 3 1 1  0  Tired, decreased energy 1 3 3  0 3  Change in appetite 0 0 0 0 0  Feeling bad or failure about yourself  0 3 1 1 1   Trouble concentrating 1 3 2  0 1  Moving slowly or fidgety/restless 0 0 0 0 0  Suicidal thoughts 0 0 0 0 0  PHQ-9 Score 7 17 10 4 8   Difficult doing work/chores Somewhat difficult Somewhat difficult Somewhat difficult Not difficult at all Somewhat difficult       09/24/2022  8:59 AM 04/02/2022    8:43 AM 10/25/2021    4:07 PM 08/10/2021    9:32 AM  GAD 7 : Generalized Anxiety Score  Nervous, Anxious, on Edge 2 3 3 2   Control/stop worrying 1 3 3 1   Worry too much - different things 0 3 1 2   Trouble relaxing 1 3 2 1   Restless 0 3 1 1   Easily annoyed or irritable 1 3 3 2   Afraid - awful might happen 1 3 1  0  Total GAD 7 Score 6 21 14 9   Anxiety Difficulty Somewhat difficult Somewhat difficult Somewhat difficult Not difficult at all     Relevant past medical, surgical, family and social history reviewed and updated as indicated. Interim medical history since our last visit reviewed. Allergies and medications reviewed and updated.  Review of Systems  Constitutional:  Negative for activity  change, appetite change, diaphoresis, fatigue and fever.  Respiratory:  Negative for cough, chest tightness and shortness of breath.   Cardiovascular:  Negative for chest pain, palpitations and leg swelling.  Gastrointestinal: Negative.   Psychiatric/Behavioral:  Positive for decreased concentration and sleep disturbance. Negative for self-injury and suicidal ideas. The patient is nervous/anxious.    Per HPI unless specifically indicated above     Objective:    There were no vitals taken for this visit.  Wt Readings from Last 3 Encounters:  10/25/21 113 lb 6.4 oz (51.4 kg)  10/04/21 124 lb (56.2 kg)  05/03/21 124 lb 9.6 oz (56.5 kg)    Physical Exam Vitals and nursing note reviewed.  Constitutional:      General: She is awake. She is not in acute distress.    Appearance: She is well-developed. She is not ill-appearing.  HENT:     Head: Normocephalic.     Right Ear: Hearing normal.     Left Ear: Hearing normal.  Eyes:     General: Lids are normal.        Right eye: No discharge.        Left eye: No discharge.     Conjunctiva/sclera: Conjunctivae normal.  Pulmonary:     Effort: Pulmonary effort is normal. No accessory muscle usage or respiratory distress.  Musculoskeletal:     Cervical back: Normal range of motion.  Neurological:     Mental Status: She is alert and oriented to person, place, and time.  Psychiatric:        Attention and Perception: Attention normal.        Mood and Affect: Mood normal.        Behavior: Behavior normal. Behavior is cooperative.        Thought Content: Thought content normal.        Judgment: Judgment normal.    Results for orders placed or performed in visit on 10/25/21  CBC with Differential/Platelet  Result Value Ref Range   WBC 6.3 3.4 - 10.8 x10E3/uL   RBC 3.92 3.77 - 5.28 x10E6/uL   Hemoglobin 12.8 11.1 - 15.9 g/dL   Hematocrit 43.3 29.5 - 46.6 %   MCV 95 79 - 97 fL   MCH 32.7 26.6 - 33.0 pg   MCHC 34.4 31.5 - 35.7 g/dL   RDW  18.8 41.6 - 60.6 %   Platelets 305 150 - 450 x10E3/uL   Neutrophils 48 Not Estab. %   Lymphs 39 Not Estab. %   Monocytes 10 Not Estab. %   Eos 2 Not Estab. %   Basos 1 Not Estab. %  Neutrophils Absolute 3.0 1.4 - 7.0 x10E3/uL   Lymphocytes Absolute 2.4 0.7 - 3.1 x10E3/uL   Monocytes Absolute 0.6 0.1 - 0.9 x10E3/uL   EOS (ABSOLUTE) 0.1 0.0 - 0.4 x10E3/uL   Basophils Absolute 0.1 0.0 - 0.2 x10E3/uL   Immature Granulocytes 0 Not Estab. %   Immature Grans (Abs) 0.0 0.0 - 0.1 x10E3/uL  Comprehensive metabolic panel  Result Value Ref Range   Glucose 91 70 - 99 mg/dL   BUN 11 6 - 24 mg/dL   Creatinine, Ser 7.82 0.57 - 1.00 mg/dL   eGFR 956 >21 HY/QMV/7.84   BUN/Creatinine Ratio 16 9 - 23   Sodium 134 134 - 144 mmol/L   Potassium 4.2 3.5 - 5.2 mmol/L   Chloride 97 96 - 106 mmol/L   CO2 20 20 - 29 mmol/L   Calcium 10.0 8.7 - 10.2 mg/dL   Total Protein 7.2 6.0 - 8.5 g/dL   Albumin 4.9 3.9 - 4.9 g/dL   Globulin, Total 2.3 1.5 - 4.5 g/dL   Albumin/Globulin Ratio 2.1 1.2 - 2.2   Bilirubin Total <0.2 0.0 - 1.2 mg/dL   Alkaline Phosphatase 36 (L) 44 - 121 IU/L   AST 21 0 - 40 IU/L   ALT 21 0 - 32 IU/L  Lipid Panel w/o Chol/HDL Ratio  Result Value Ref Range   Cholesterol, Total 205 (H) 100 - 199 mg/dL   Triglycerides 696 0 - 149 mg/dL   HDL 57 >29 mg/dL   VLDL Cholesterol Cal 24 5 - 40 mg/dL   LDL Chol Calc (NIH) 528 (H) 0 - 99 mg/dL  TSH  Result Value Ref Range   TSH 2.450 0.450 - 4.500 uIU/mL  [id number] 11+Oxyco+Alc+Crt-Bund  Result Value Ref Range   Ethanol Negative Cutoff=0.020 %   Amphetamines, Urine Negative Cutoff=1000 ng/mL   Barbiturate Negative Cutoff=200 ng/mL   BENZODIAZ UR QL Negative Cutoff=200 ng/mL   Cannabinoid Quant, Ur Negative Cutoff=50 ng/mL   Cocaine (Metabolite) Negative Cutoff=300 ng/mL   OPIATE SCREEN URINE Negative Cutoff=300 ng/mL   Oxycodone/Oxymorphone, Urine Negative Cutoff=300 ng/mL   Phencyclidine Negative Cutoff=25 ng/mL   Methadone Screen, Urine  Negative Cutoff=300 ng/mL   Propoxyphene Negative Cutoff=300 ng/mL   Meperidine Negative Cutoff=200 ng/mL   Tramadol Negative Cutoff=200 ng/mL   Creatinine 11.5 (L) 20.0 - 300.0 mg/dL   pH, Urine 6.1 4.5 - 8.9  Lipase  Result Value Ref Range   Lipase 57 14 - 72 U/L  Amylase  Result Value Ref Range   Amylase 71 31 - 110 U/L  Specific Gravity  Result Value Ref Range   Specific Gravity 1.0028       Assessment & Plan:   Problem List Items Addressed This Visit       Other   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  UDS due 10/26/22 and controlled subs agreement up to date.  Will send 30 day supply + 2 other 30 day supplies predated for future.  Agrees to every 3 month visits, can be virtual if needed.  Her goal in long run is to transition to Effexor only if possible and come off Adderall, discussed this at length with her and recommend no cold [place] stopping but can work on gradual reduction over time with addition of Effexor.      Relevant Medications   amphetamine-dextroamphetamine (ADDERALL XR) 25 MG 24 hr capsule (Start on 10/18/2022)   Anxiety and depression    Chronic. At this time denies  SI/HI.  Mood slightly exacerbated by work stressors.  Continue Effexor XR 150 MG daily at this time + Vistaril as needed only for panic.  Her goal in long run is to transition to Effexor only if possible and come off Adderall, discussed this at length with her and recommend no cold [place] stopping but can work on gradual reduction over time with addition of Effexor.  Discussed adding on therapy or visit with psychiatry if worsening mood.          I discussed the assessment and treatment plan with the patient. The patient was provided an opportunity to ask questions and all were answered. The patient agreed with the plan and demonstrated an understanding of the instructions.   The patient was advised to call back or seek an in-person evaluation if  the symptoms worsen or if the condition fails to improve as anticipated.   I provided 21+ minutes of time during this encounter.    Follow up plan: Return for as scheduled in August.

## 2022-10-19 ENCOUNTER — Other Ambulatory Visit (HOSPITAL_COMMUNITY): Payer: Self-pay

## 2022-10-20 ENCOUNTER — Other Ambulatory Visit (HOSPITAL_COMMUNITY): Payer: Self-pay

## 2022-10-21 NOTE — Patient Instructions (Addendum)
Black Cohosh -- 20 to 80 MG  Be Involved in Caring For Your Health:  Taking Medications When medications are taken as directed, they can greatly improve your health. But if they are not taken as prescribed, they may not work. In some cases, not taking them correctly can be harmful. To help ensure your treatment remains effective and safe, understand your medications and how to take them. Bring your medications to each visit for review by your provider.  Your lab results, notes, and after visit summary will be available on My Chart. We strongly encourage you to use this feature. If lab results are abnormal the clinic will contact you with the appropriate steps. If the clinic does not contact you assume the results are satisfactory. You can always view your results on My Chart. If you have questions regarding your health or results, please contact the clinic during office hours. You can also ask questions on My Chart.  We at [facility] are grateful that you chose [place] to provide your care. We strive to provide evidence-based and compassionate care and are always looking for feedback. If you get a survey from the clinic please complete this so we can hear your opinions.  Healthy Eating, Adult Healthy eating may help you get and keep a healthy body weight, reduce the risk of chronic disease, and live a long and productive life. It is important to follow a healthy eating pattern. Your nutritional and calorie needs should be met mainly by different nutrient-rich foods. What are tips for following this plan? Reading food labels Read labels and choose the following: Reduced or low sodium products. Juices with 100% fruit juice. Foods with low saturated fats (<3 g per serving) and high polyunsaturated and monounsaturated fats. Foods with whole grains, such as whole wheat, cracked wheat, brown rice, and wild rice. Whole grains that are fortified with folic acid. This is recommended for females  who are pregnant or who want to become pregnant. Read labels and do not eat or drink the following: Foods or drinks with added sugars. These include foods that contain brown sugar, corn sweetener, corn syrup, dextrose, fructose, glucose, high-fructose corn syrup, honey, invert sugar, lactose, malt syrup, maltose, molasses, raw sugar, sucrose, trehalose, or turbinado sugar. Limit your intake of added sugars to less than 10% of your total daily calories. Do not eat more than the following amounts of added sugar per day: 6 teaspoons (25 g) for females. 9 teaspoons (38 g) for males. Foods that contain processed or refined starches and grains. Refined grain products, such as white flour, degermed cornmeal, white bread, and white rice. Shopping Choose nutrient-rich snacks, such as vegetables, whole fruits, and nuts. Avoid high-calorie and high-sugar snacks, such as potato chips, fruit snacks, and candy. Use oil-based dressings and spreads on foods instead of solid fats such as butter, margarine, sour cream, or cream cheese. Limit pre-made sauces, mixes, and "instant" products such as flavored rice, instant noodles, and ready-made pasta. Try more plant-protein sources, such as tofu, tempeh, black beans, edamame, lentils, nuts, and seeds. Explore eating plans such as the Mediterranean diet or vegetarian diet. Try heart-healthy dips made with beans and healthy fats like hummus and guacamole. Vegetables go great with these. Cooking Use oil to saut or stir-fry foods instead of solid fats such as butter, margarine, or lard. Try baking, boiling, grilling, or broiling instead of frying. Remove the fatty part of meats before cooking. Steam vegetables in water or broth. Meal planning  At meals,  imagine dividing your plate into fourths: One-half of your plate is fruits and vegetables. One-fourth of your plate is whole grains. One-fourth of your plate is protein, especially lean meats, poultry, eggs, tofu,  beans, or nuts. Include low-fat dairy as part of your daily diet. Lifestyle Choose healthy options in all settings, including home, work, school, restaurants, or stores. Prepare your food safely: Wash your hands after handling raw meats. Where you prepare food, keep surfaces clean by regularly washing with hot, soapy water. Keep raw meats separate from ready-to-eat foods, such as fruits and vegetables. Cook seafood, meat, poultry, and eggs to the recommended temperature. Get a food thermometer. Store foods at safe temperatures. In general: Keep cold foods at 82F (4.4C) or below. Keep hot foods at 182F (60C) or above. Keep your freezer at [facility] (-17.8C) or below. Foods are not safe to eat if they have been between the temperatures of 40-182F (4.4-60C) for more than 2 hours. What foods should I eat? Fruits Aim to eat 1-2 cups of fresh, canned (in natural juice), or frozen fruits each day. One cup of fruit equals 1 small apple, 1 large banana, 8 large strawberries, 1 cup (237 g) canned fruit,  cup (82 g) dried fruit, or 1 cup (240 mL) 100% juice. Vegetables Aim to eat 2-4 cups of fresh and frozen vegetables each day, including different varieties and colors. One cup of vegetables equals 1 cup (91 g) broccoli or cauliflower florets, 2 medium carrots, 2 cups (150 g) raw, leafy greens, 1 large tomato, 1 large bell pepper, 1 large sweet potato, or 1 medium white potato. Grains Aim to eat 5-10 ounce-equivalents of whole grains each day. Examples of 1 ounce-equivalent of grains include 1 slice of bread, 1 cup (40 g) ready-to-eat cereal, 3 cups (24 g) popcorn, or  cup (93 g) cooked rice. Meats and other proteins Try to eat 5-7 ounce-equivalents of protein each day. Examples of 1 ounce-equivalent of protein include 1 egg,  oz nuts (12 almonds, 24 pistachios, or 7 walnut halves), 1/4 cup (90 g) cooked beans, 6 tablespoons (90 g) hummus or 1 tablespoon (16 g) peanut butter. A cut of meat or fish  that is the size of a deck of cards is about 3-4 ounce-equivalents (85 g). Of the protein you eat each week, try to have at least 8 sounce (227 g) of seafood. This is about 2 servings per week. This includes salmon, trout, herring, sardines, and anchovies. Dairy Aim to eat 3 cup-equivalents of fat-free or low-fat dairy each day. Examples of 1 cup-equivalent of dairy include 1 cup (240 mL) milk, 8 ounces (250 g) yogurt, 1 ounces (44 g) natural cheese, or 1 cup (240 mL) fortified soy milk. Fats and oils Aim for about 5 teaspoons (21 g) of fats and oils per day. Choose monounsaturated fats, such as canola and olive oils, mayonnaise made with olive oil or avocado oil, avocados, peanut butter, and most nuts, or polyunsaturated fats, such as sunflower, corn, and soybean oils, walnuts, pine nuts, sesame seeds, sunflower seeds, and flaxseed. Beverages Aim for 6 eight-ounce glasses of water per day. Limit coffee to 3-5 eight-ounce cups per day. Limit caffeinated beverages that have added calories, such as soda and energy drinks. If you drink alcohol: Limit how much you have to: 0-1 drink a day if you are female. 0-2 drinks a day if you are female. Know how much alcohol is in your drink. In the [place] one drink is one 12 oz bottle of beer (  355 mL), one 5 oz glass of wine (148 mL), or one 1 oz glass of hard liquor (44 mL). Seasoning and other foods Try not to add too much salt to your food. Try using herbs and spices instead of salt. Try not to add sugar to food. This information is based on [place] nutrition guidelines. To learn more, visit [employer]. Exact amounts may vary. You may need different amounts. This information is not intended to replace advice given to you by your health care provider. Make sure you discuss any questions you have with your health care provider. Document Revised: 11/13/2021 Document Reviewed: 11/13/2021 Elsevier Patient Education  2024 [employer].

## 2022-10-23 ENCOUNTER — Other Ambulatory Visit (HOSPITAL_COMMUNITY): Payer: Self-pay

## 2022-10-26 ENCOUNTER — Encounter: Payer: Self-pay | Admitting: Nurse Practitioner

## 2022-10-26 ENCOUNTER — Other Ambulatory Visit (HOSPITAL_COMMUNITY)
Admission: RE | Admit: 2022-10-26 | Discharge: 2022-10-26 | Disposition: A | Discharge status: Home / Self Care | Payer: 59 | Source: Ambulatory Visit | Attending: Nurse Practitioner | Admitting: Nurse Practitioner

## 2022-10-26 ENCOUNTER — Other Ambulatory Visit (HOSPITAL_COMMUNITY): Payer: Self-pay

## 2022-10-26 ENCOUNTER — Ambulatory Visit (INDEPENDENT_AMBULATORY_CARE_PROVIDER_SITE_OTHER): Payer: 59 | Admitting: Nurse Practitioner

## 2022-10-26 VITALS — BP 120/81 | HR 93 | Temp 98.1°F | Ht 65.3 in | Wt 132.4 lb

## 2022-10-26 DIAGNOSIS — F988 Other specified behavioral and emotional disorders with onset usually occurring in childhood and adolescence: Secondary | ICD-10-CM | POA: Diagnosis not present

## 2022-10-26 DIAGNOSIS — E7800 Pure hypercholesterolemia, unspecified: Secondary | ICD-10-CM

## 2022-10-26 DIAGNOSIS — Z79899 Other long term (current) drug therapy: Secondary | ICD-10-CM | POA: Diagnosis not present

## 2022-10-26 DIAGNOSIS — Z0000 Encounter for general adult medical examination without abnormal findings: Secondary | ICD-10-CM | POA: Diagnosis not present

## 2022-10-26 DIAGNOSIS — Z8742 Personal history of other diseases of the female genital tract: Secondary | ICD-10-CM

## 2022-10-26 DIAGNOSIS — F32A Depression, unspecified: Secondary | ICD-10-CM

## 2022-10-26 DIAGNOSIS — Z800 Family history of malignant neoplasm of digestive organs: Secondary | ICD-10-CM | POA: Diagnosis not present

## 2022-10-26 DIAGNOSIS — Z1231 Encounter for screening mammogram for malignant neoplasm of breast: Secondary | ICD-10-CM

## 2022-10-26 DIAGNOSIS — F419 Anxiety disorder, unspecified: Secondary | ICD-10-CM

## 2022-10-26 MED ORDER — BUPROPION HCL ER (XL) 150 MG PO TB24
ORAL_TABLET | ORAL | 1 refills | Status: DC
  Filled 2022-10-26: qty 90, 49d supply, fill #0
  Filled 2022-12-26: qty 90, 49d supply, fill #1

## 2022-10-26 NOTE — Progress Notes (Signed)
BP 120/81   Pulse 93   Temp 98.1 F (36.7 C) (Oral)   Ht 5' 5.3" (1.659 m)   Wt 132 lb 6.4 oz (60.1 kg)   LMP 10/07/2022 (Exact Date)   SpO2 99%   BMI 21.83 kg/m    Subjective:    Patient ID: Shirley Rollins, female    DOB: 1980-03-01, 42 y.o.   MRN: [id number]  HPI: Shirley Rollins is a 42 y.o. female presenting on 10/26/2022 for comprehensive medical examination. Current medical complaints include:none  She currently lives with: self and son Menopausal Symptoms: night sweats and brain fog -- wakes up soaking wet.  ADD Continues on Adderall XR 25 MG daily.  She been on this regimen for > 8 years and overall works well.  Last fill on PDMP review 10/20/22. Status: stable Satisfied with current therapy: yes Medication compliance:  good compliance Controlled substance contract: yes Previous psychiatry evaluation: no Previous medications: yes adderall XR   Taking meds on weekends/vacations: yes Work/school performance:  good Difficulty sustaining attention/completing tasks:  occasional Distracted by extraneous stimuli: yes Does not listen when spoken to: no  Fidgets with hands or feet: no Unable to stay in seat: no Blurts out/interrupts others: no ADHD Medication Side Effects: no    Decreased appetite: no    Headache: no    Sleeping disturbance pattern: no    Irritability: no    Rebound effects (worse than baseline) off medication: no    Anxiousness: no    Dizziness: no    Tics: no  DEPRESSION Taking Effexor XR 150 MG daily.  Her mother has history of pancreatic cancer diagnosed at age 75.  She feels like she is in perimenopause -- having brain fog, night sweats, and mood changes + cycle changes.  IS having work stress. Mood status: exacerbated Satisfied with current treatment?: yes Symptom severity: moderate  Duration of current treatment : chronic Side effects: no Medication compliance: good compliance Psychotherapy/counseling: yes in past Depressed mood:  yes Anxious mood: yes Anhedonia: yes Significant weight loss or gain: no Insomnia: none Fatigue: yes Feelings of worthlessness or guilt: no Impaired concentration/indecisiveness: no Suicidal ideations: no Hopelessness: no Crying spells: no    10/26/2022    9:10 AM 09/24/2022    8:57 AM 04/02/2022    8:42 AM 10/25/2021    4:06 PM 08/10/2021    9:31 AM  Depression screen PHQ 2/9  Decreased Interest 1 3 3 1 1   Down, Depressed, Hopeless 2 1 2 2 1   PHQ - 2 Score 3 4 5 3 2   Altered sleeping 2 1 3 1 1   Tired, decreased energy 3 1 3 3  0  Change in appetite 0 0 0 0 0  Feeling bad or failure about yourself  1 0 3 1 1   Trouble concentrating 2 1 3 2  0  Moving slowly or fidgety/restless 0 0 0 0 0  Suicidal thoughts 0 0 0 0 0  PHQ-9 Score 11 7 17 10 4   Difficult doing work/chores Somewhat difficult Somewhat difficult Somewhat difficult Somewhat difficult Not difficult at all       10/26/2022    9:10 AM 09/24/2022    8:59 AM 04/02/2022    8:43 AM 10/25/2021    4:07 PM  GAD 7 : Generalized Anxiety Score  Nervous, Anxious, on Edge 2 2 3 3   Control/stop worrying 1 1 3 3   Worry too much - different things 3 0 3 1  Trouble relaxing 1 1 3  2  Restless 2 0 3 1  Easily annoyed or irritable 3 1 3 3   Afraid - awful might happen 0 1 3 1   Total GAD 7 Score 12 6 21 14   Anxiety Difficulty Very difficult Somewhat difficult Somewhat difficult Somewhat difficult      10/04/2021    4:17 PM 10/25/2021    4:06 PM 10/25/2021    4:18 PM 09/24/2022    8:57 AM 10/26/2022    9:10 AM  Fall Risk  Falls in the past year?  0 0 0 0  Was there an injury with Fall?  0 0 0 0  Fall Risk Category Calculator  0 0 0 0  Fall Risk Category (Retired)  Low Low    (RETIRED) Patient Fall Risk Level Low fall risk Low fall risk Low fall risk    Patient at Risk for Falls Due to  No Fall Risks No Fall Risks No Fall Risks No Fall Risks  Fall risk Follow up  Falls evaluation completed Falls prevention discussed  Falls evaluation  completed    Functional Status Survey: Is the patient deaf or have difficulty hearing?: No Does the patient have difficulty seeing, even when wearing glasses/contacts?: No Does the patient have difficulty concentrating, remembering, or making decisions?: No Does the patient have difficulty walking or climbing stairs?: No Does the patient have difficulty dressing or bathing?: No Does the patient have difficulty doing errands alone such as visiting a doctor's office or shopping?: No   Past Medical History:  Past Medical History:  Diagnosis Date   ADD (attention deficit disorder)    Allergy    Noted in chart   Anemia    Anxiety    In chart   Depression In chart   Migraines     Surgical History:  Past Surgical History:  Procedure Laterality Date   etopic pregnancy  02/26/1997   TONSILLECTOMY  in 86's    Medications:  Current Outpatient Medications on File Prior to Visit  Medication Sig   amphetamine-dextroamphetamine (ADDERALL XR) 25 MG 24 hr capsule Take 1 capsule by mouth every morning.   [START ON 11/16/2022] amphetamine-dextroamphetamine (ADDERALL XR) 25 MG 24 hr capsule Take 1 capsule by mouth every morning. Will need visit for further refills.   [START ON 12/17/2022] amphetamine-dextroamphetamine (ADDERALL XR) 25 MG 24 hr capsule Take 1 capsule by mouth every morning.   hydrOXYzine (VISTARIL) 25 MG capsule Take 1 capsule (25 mg total) by mouth every 8 (eight) hours as needed.   Multiple Vitamin (MULTIVITAMIN) tablet Take 1 tablet by mouth daily.   venlafaxine XR (EFFEXOR-XR) 150 MG 24 hr capsule Take 1 capsule (150 mg total) by mouth daily with breakfast.   No current facility-administered medications on file prior to visit.    Allergies:  Allergies  Allergen Reactions   Penicillin G Anaphylaxis   Sulfa Antibiotics Other (See Comments)    Social History:  Social History   Socioeconomic History   Marital status: Divorced    Spouse name: Not on file   Number of  children: 1   Years of education: Not on file   Highest education level: Not on file  Occupational History   Occupation: dietician  Tobacco Use   Smoking status: Former    Current packs/day: 0.00    Types: Cigarettes    Quit date: 09/01/2018    Years since quitting: 4.1   Smokeless tobacco: Never   Tobacco comments:    3/4 of a pack per day  Vaping Use   Vaping status: Never Used  Substance and Sexual Activity   Alcohol use: Yes    Alcohol/week: 1.0 - 2.0 standard drink of alcohol    Types: 1 - 2 Glasses of wine per week    Comment: occassional   Drug use: No   Sexual activity: Not Currently    Birth control/protection: Condom  Other Topics Concern   Not on file  Social History Narrative   Not on file   Social Determinants of Health   Financial Resource Strain: Low Risk  (10/11/2020)   Overall Financial Resource Strain (CARDIA)    Difficulty of Paying Living Expenses: Not very hard  Food Insecurity: No Food Insecurity (10/11/2020)   Hunger Vital Sign    Worried About Running Out of Food in the Last Year: Never true    Ran Out of Food in the Last Year: Never true  Transportation Needs: No Transportation Needs (10/11/2020)   PRAPARE - Administrator, Civil Service (Medical): No    Lack of Transportation (Non-Medical): No  Physical Activity: Sufficiently Active (10/11/2020)   Exercise Vital Sign    Days of Exercise per Week: 7 days    Minutes of Exercise per Session: 30 min  Stress: No Stress Concern Present (10/11/2020)   [employer] of Occupational Health - Occupational Stress Questionnaire    Feeling of Stress : Only a little  Social Connections: Moderately Isolated (10/11/2020)   Social Connection and Isolation Panel [NHANES]    Frequency of Communication with Friends and Family: More than three times a week    Frequency of Social Gatherings with Friends and Family: More than three times a week    Attends Religious Services: More than 4 times per year     Active Member of [employer] or Organizations: No    Attends Banker Meetings: Never    Marital Status: Divorced  Catering manager Violence: Not At Risk (10/11/2020)   Humiliation, Afraid, Rape, and Kick questionnaire    Fear of Current or Ex-Partner: No    Emotionally Abused: No    Physically Abused: No    Sexually Abused: No   Social History   Tobacco Use  Smoking Status Former   Current packs/day: 0.00   Types: Cigarettes   Quit date: 09/01/2018   Years since quitting: 4.1  Smokeless Tobacco Never  Tobacco Comments   3/4 of a pack per day   Social History   Substance and Sexual Activity  Alcohol Use Yes   Alcohol/week: 1.0 - 2.0 standard drink of alcohol   Types: 1 - 2 Glasses of wine per week   Comment: occassional    Family History:  Family History  Problem Relation Age of Onset   Diabetes Mother    Pancreatic cancer Mother 68   Cancer Mother        pancreatic   Heart attack Maternal Grandfather    Heart disease Maternal Grandfather    Breast cancer Neg Hx     Past medical history, surgical history, medications, allergies, family history and social history reviewed with patient today and changes made to appropriate areas of the chart.   ROS All other ROS negative except what is listed above and in the HPI.      Objective:    BP 120/81   Pulse 93   Temp 98.1 F (36.7 C) (Oral)   Ht 5' 5.3" (1.659 m)   Wt 132 lb 6.4 oz (60.1 kg)   LMP 10/07/2022 (  Exact Date)   SpO2 99%   BMI 21.83 kg/m   Wt Readings from Last 3 Encounters:  10/26/22 132 lb 6.4 oz (60.1 kg)  10/25/21 113 lb 6.4 oz (51.4 kg)  10/04/21 124 lb (56.2 kg)    Physical Exam Vitals and nursing note reviewed. Exam conducted with a chaperone present.  Constitutional:      General: She is awake. She is not in acute distress.    Appearance: She is well-developed and well-groomed. She is not ill-appearing or toxic-appearing.  HENT:     Head: Normocephalic and atraumatic.     Right  Ear: Hearing, tympanic membrane, ear canal and external ear normal. No drainage.     Left Ear: Hearing, tympanic membrane, ear canal and external ear normal. No drainage.     Nose: Nose normal.     Right Sinus: No maxillary sinus tenderness or frontal sinus tenderness.     Left Sinus: No maxillary sinus tenderness or frontal sinus tenderness.     Mouth/Throat:     Mouth: Mucous membranes are moist.     Pharynx: Oropharynx is clear. Uvula midline. No pharyngeal swelling, oropharyngeal exudate or posterior oropharyngeal erythema.  Eyes:     General: Lids are normal.        Right eye: No discharge.        Left eye: No discharge.     Extraocular Movements: Extraocular movements intact.     Conjunctiva/sclera: Conjunctivae normal.     Pupils: Pupils are equal, round, and reactive to light.     Visual Fields: Right eye visual fields normal and left eye visual fields normal.  Neck:     Thyroid: No thyromegaly.     Vascular: No carotid bruit.     Trachea: Trachea normal.  Cardiovascular:     Rate and Rhythm: Normal rate and regular rhythm.     Heart sounds: Normal heart sounds. No murmur heard.    No gallop.  Pulmonary:     Effort: Pulmonary effort is normal. No accessory muscle usage or respiratory distress.     Breath sounds: Normal breath sounds.  Chest:  Breasts:    Right: Normal.     Left: Normal.  Abdominal:     General: Bowel sounds are normal.     Palpations: Abdomen is soft. There is no hepatomegaly or splenomegaly.     Tenderness: There is no abdominal tenderness.     Hernia: There is no hernia in the left inguinal area or right inguinal area.  Genitourinary:    Exam position: Lithotomy position.     Labia:        Right: No rash.        Left: No rash.      Vagina: Normal.     Cervix: Normal.     Uterus: Normal.      Adnexa: Right adnexa normal and left adnexa normal.     Comments: Cervix midline and viewed -- pap sent. Musculoskeletal:        General: Normal range of  motion.     Cervical back: Normal range of motion and neck supple.     Right lower leg: No edema.     Left lower leg: No edema.  Lymphadenopathy:     Head:     Right side of head: No submental, submandibular, tonsillar, preauricular or posterior auricular adenopathy.     Left side of head: No submental, submandibular, tonsillar, preauricular or posterior auricular adenopathy.     Cervical: No  cervical adenopathy.     Upper Body:     Right upper body: No supraclavicular, axillary or pectoral adenopathy.     Left upper body: No supraclavicular, axillary or pectoral adenopathy.  Skin:    General: Skin is warm and dry.     Capillary Refill: Capillary refill takes less than 2 seconds.     Findings: No rash.  Neurological:     Mental Status: She is alert and oriented to person, place, and time.     Gait: Gait is intact.     Deep Tendon Reflexes: Reflexes are normal and symmetric.     Reflex Scores:      Brachioradialis reflexes are 2+ on the right side and 2+ on the left side.      Patellar reflexes are 2+ on the right side and 2+ on the left side. Psychiatric:        Attention and Perception: Attention normal.        Mood and Affect: Mood normal.        Speech: Speech normal.        Behavior: Behavior normal. Behavior is cooperative.        Thought Content: Thought content normal.        Judgment: Judgment normal.     Results for orders placed or performed in visit on 10/25/21  CBC with Differential/Platelet  Result Value Ref Range   WBC 6.3 3.4 - 10.8 x10E3/uL   RBC 3.92 3.77 - 5.28 x10E6/uL   Hemoglobin 12.8 11.1 - 15.9 g/dL   Hematocrit 41.3 24.4 - 46.6 %   MCV 95 79 - 97 fL   MCH 32.7 26.6 - 33.0 pg   MCHC 34.4 31.5 - 35.7 g/dL   RDW 01.0 27.2 - 53.6 %   Platelets 305 150 - 450 x10E3/uL   Neutrophils 48 Not Estab. %   Lymphs 39 Not Estab. %   Monocytes 10 Not Estab. %   Eos 2 Not Estab. %   Basos 1 Not Estab. %   Neutrophils Absolute 3.0 1.4 - 7.0 x10E3/uL    Lymphocytes Absolute 2.4 0.7 - 3.1 x10E3/uL   Monocytes Absolute 0.6 0.1 - 0.9 x10E3/uL   EOS (ABSOLUTE) 0.1 0.0 - 0.4 x10E3/uL   Basophils Absolute 0.1 0.0 - 0.2 x10E3/uL   Immature Granulocytes 0 Not Estab. %   Immature Grans (Abs) 0.0 0.0 - 0.1 x10E3/uL  Comprehensive metabolic panel  Result Value Ref Range   Glucose 91 70 - 99 mg/dL   BUN 11 6 - 24 mg/dL   Creatinine, Ser 6.44 0.57 - 1.00 mg/dL   eGFR 034 >74 QV/ZDG/3.87   BUN/Creatinine Ratio 16 9 - 23   Sodium 134 134 - 144 mmol/L   Potassium 4.2 3.5 - 5.2 mmol/L   Chloride 97 96 - 106 mmol/L   CO2 20 20 - 29 mmol/L   Calcium 10.0 8.7 - 10.2 mg/dL   Total Protein 7.2 6.0 - 8.5 g/dL   Albumin 4.9 3.9 - 4.9 g/dL   Globulin, Total 2.3 1.5 - 4.5 g/dL   Albumin/Globulin Ratio 2.1 1.2 - 2.2   Bilirubin Total <0.2 0.0 - 1.2 mg/dL   Alkaline Phosphatase 36 (L) 44 - 121 IU/L   AST 21 0 - 40 IU/L   ALT 21 0 - 32 IU/L  Lipid Panel w/o Chol/HDL Ratio  Result Value Ref Range   Cholesterol, Total 205 (H) 100 - 199 mg/dL   Triglycerides 564 0 - 149 mg/dL   HDL  57 >39 mg/dL   VLDL Cholesterol Cal 24 5 - 40 mg/dL   LDL Chol Calc (NIH) 161 (H) 0 - 99 mg/dL  TSH  Result Value Ref Range   TSH 2.450 0.450 - 4.500 uIU/mL  [id number] 11+Oxyco+Alc+Crt-Bund  Result Value Ref Range   Ethanol Negative Cutoff=0.020 %   Amphetamines, Urine Negative Cutoff=1000 ng/mL   Barbiturate Negative Cutoff=200 ng/mL   BENZODIAZ UR QL Negative Cutoff=200 ng/mL   Cannabinoid Quant, Ur Negative Cutoff=50 ng/mL   Cocaine (Metabolite) Negative Cutoff=300 ng/mL   OPIATE SCREEN URINE Negative Cutoff=300 ng/mL   Oxycodone/Oxymorphone, Urine Negative Cutoff=300 ng/mL   Phencyclidine Negative Cutoff=25 ng/mL   Methadone Screen, Urine Negative Cutoff=300 ng/mL   Propoxyphene Negative Cutoff=300 ng/mL   Meperidine Negative Cutoff=200 ng/mL   Tramadol Negative Cutoff=200 ng/mL   Creatinine 11.5 (L) 20.0 - 300.0 mg/dL   pH, Urine 6.1 4.5 - 8.9  Lipase  Result  Value Ref Range   Lipase 57 14 - 72 U/L  Amylase  Result Value Ref Range   Amylase 71 31 - 110 U/L  Specific Gravity  Result Value Ref Range   Specific Gravity 1.0028       Assessment & Plan:   Problem List Items Addressed This Visit       Other   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  UDS updated today, will be due next 10/26/23, and controlled subs agreement up to date.  Has up to date refills present.  Agrees to every 3 month visits, can be virtual if needed.  Her goal in long run is to transition to Effexor only if possible and come off Adderall, discussed this at length with her and recommend no cold [place] stopping but can work on gradual reduction over time with addition of Effexor.      Relevant Orders   [id number] 11+Oxyco+Alc+Crt-Bund   Anxiety and depression    Chronic, exacerbated.  Denies SI/HI.  Mood slightly exacerbated by work stressors. Continue Effexor XR 150 MG daily at this time + Vistaril as needed only for panic.  Will trial adding on Wellbutrin which may help her anhedonia and overall mood + discussed with her may help wean off Adderall.  Educated her on medication and side effects + BLACK BOX warning.  Her goal in long run is to transition to Effexor only if possible and come off Adderall, discussed this at length with her and recommend no cold [place] stopping but can work on gradual reduction over time with addition of Effexor.  Discussed adding on therapy or visit with psychiatry if worsening mood.        Relevant Medications   buPROPion (WELLBUTRIN XL) 150 MG 24 hr tablet   Other Relevant Orders   CBC with Differential/Platelet   TSH   Elevated low density lipoprotein (LDL) cholesterol level    Noted on past labs, check lipid panel today and continue diet focus at home. The 10-year ASCVD risk score (Arnett DK, et al., 2019) is: 0.7%   Values used to calculate the score:     Age: 53 years     Sex: Female     Is  Non-Hispanic African American: No     Diabetic: No     Tobacco smoker: No     Systolic Blood Pressure: 120 mmHg     Is BP treated: No     HDL Cholesterol: 57 mg/dL     Total Cholesterol: 205 mg/dL  Relevant Orders   Comprehensive metabolic panel   Lipid Panel w/o Chol/HDL Ratio   Family history of pancreatic cancer    In mother, passed at age 65.  Will plan on monitoring labs yearly and as needed if symptoms.      Relevant Orders   Amylase   Lipase   History of abnormal cervical Pap smear    Pap performed today and sent to lab.      Relevant Orders   Cytology - PAP   Other Visit Diagnoses     High risk medication use       UDS obtained due to Adderall use.   Relevant Orders   [id number] 11+Oxyco+Alc+Crt-Bund   Encounter for screening mammogram for malignant neoplasm of breast       Mammogram ordered and instructed on how to schedule.   Relevant Orders   MM 3D SCREENING MAMMOGRAM BILATERAL BREAST   Encounter for annual physical exam       Annual physical today with labs and health maintenance reviewed, discussed with patient.   Relevant Orders   CBC with Differential/Platelet         Follow up plan: Return in about 4 weeks (around 11/23/2022) for MOOD -- added on Wellbutrin.   LABORATORY TESTING:  - Pap smear: performed today  IMMUNIZATIONS:   - Tdap: Tetanus vaccination status reviewed: last tetanus booster within 10 years. - Influenza: Up to date - Pneumovax: Not applicable - Prevnar: Not applicable - HPV: Not applicable - Zostavax vaccine: Not applicable  SCREENING: -Mammogram: Up to date  - ordered today - Colonoscopy: Not applicable  - Bone Density: Not applicable  -Hearing Test: Not applicable  -Spirometry: Not applicable   PATIENT COUNSELING:   Advised to take 1 mg of folate supplement per day if capable of pregnancy.   Sexuality: Discussed sexually transmitted diseases, partner selection, use of condoms, avoidance of unintended pregnancy   and contraceptive alternatives.   Advised to avoid cigarette smoking.  I discussed with the patient that most people either abstain from alcohol or drink within safe limits (<=14/week and <=4 drinks/occasion for males, <=7/weeks and <= 3 drinks/occasion for females) and that the risk for alcohol disorders and other health effects rises proportionally with the number of drinks per week and how often a drinker exceeds daily limits.  Discussed cessation/primary prevention of drug use and availability of treatment for abuse.   Diet: Encouraged to adjust caloric intake to maintain  or achieve ideal body weight, to reduce intake of dietary saturated fat and total fat, to limit sodium intake by avoiding high sodium foods and not adding table salt, and to maintain adequate dietary potassium and calcium preferably from fresh fruits, vegetables, and low-fat dairy products.    Stressed the importance of regular exercise  Injury prevention: Discussed safety belts, safety helmets, smoke detector, smoking near bedding or upholstery.   Dental health: Discussed importance of regular tooth brushing, flossing, and dental visits.    NEXT PREVENTATIVE PHYSICAL DUE IN 1 YEAR. Return in about 4 weeks (around 11/23/2022) for MOOD -- added on Wellbutrin.

## 2022-10-26 NOTE — Assessment & Plan Note (Signed)
Noted on past labs, check lipid panel today and continue diet focus at home. The 10-year ASCVD risk score (Arnett DK, et al., 2019) is: 0.7%   Values used to calculate the score:     Age: 42 years     Sex: Female     Is Non-Hispanic African American: No     Diabetic: No     Tobacco smoker: No     Systolic Blood Pressure: 120 mmHg     Is BP treated: No     HDL Cholesterol: 57 mg/dL     Total Cholesterol: 205 mg/dL

## 2022-10-26 NOTE — Assessment & Plan Note (Signed)
Chronic, exacerbated.  Denies SI/HI.  Mood slightly exacerbated by work stressors. Continue Effexor XR 150 MG daily at this time + Vistaril as needed only for panic.  Will trial adding on Wellbutrin which may help her anhedonia and overall mood + discussed with her may help wean off Adderall.  Educated her on medication and side effects + BLACK BOX warning.  Her goal in long run is to transition to Effexor only if possible and come off Adderall, discussed this at length with her and recommend no cold [place] stopping but can work on gradual reduction over time with addition of Effexor.  Discussed adding on therapy or visit with psychiatry if worsening mood.

## 2022-10-26 NOTE — Assessment & Plan Note (Signed)
Chronic, ongoing.  UDS updated today, will be due next 10/26/23, and controlled subs agreement up to date.  Has up to date refills present.  Agrees to every 3 month visits, can be virtual if needed.  Her goal in long run is to transition to Effexor only if possible and come off Adderall, discussed this at length with her and recommend no cold [place] stopping but can work on gradual reduction over time with addition of Effexor.

## 2022-10-26 NOTE — Assessment & Plan Note (Signed)
Pap performed today and sent to lab.

## 2022-10-26 NOTE — Assessment & Plan Note (Signed)
In mother, passed at age 44.  Will plan on monitoring labs yearly and as needed if symptoms.

## 2022-10-27 LAB — CBC WITH DIFFERENTIAL/PLATELET
Basophils Absolute: 0.1 10*3/uL (ref 0.0–0.2)
Basos: 1 %
EOS (ABSOLUTE): 0.1 10*3/uL (ref 0.0–0.4)
Eos: 1 %
Hematocrit: 43.8 % (ref 34.0–46.6)
Hemoglobin: 14 g/dL (ref 11.1–15.9)
Immature Grans (Abs): 0 10*3/uL (ref 0.0–0.1)
Immature Granulocytes: 0 %
Lymphocytes Absolute: 2.3 10*3/uL (ref 0.7–3.1)
Lymphs: 41 %
MCH: 31.4 pg (ref 26.6–33.0)
MCHC: 32 g/dL (ref 31.5–35.7)
MCV: 98 fL — ABNORMAL HIGH (ref 79–97)
Monocytes Absolute: 0.5 10*3/uL (ref 0.1–0.9)
Monocytes: 10 %
Neutrophils Absolute: 2.6 10*3/uL (ref 1.4–7.0)
Neutrophils: 47 %
Platelets: 337 10*3/uL (ref 150–450)
RBC: 4.46 x10E6/uL (ref 3.77–5.28)
RDW: 11.9 % (ref 11.7–15.4)
WBC: 5.6 10*3/uL (ref 3.4–10.8)

## 2022-10-27 LAB — COMPREHENSIVE METABOLIC PANEL
ALT: 23 IU/L (ref 0–32)
AST: 23 IU/L (ref 0–40)
Albumin: 5.1 g/dL — ABNORMAL HIGH (ref 3.9–4.9)
Alkaline Phosphatase: 45 IU/L (ref 44–121)
BUN/Creatinine Ratio: 13 (ref 9–23)
BUN: 10 mg/dL (ref 6–24)
Bilirubin Total: 0.5 mg/dL (ref 0.0–1.2)
CO2: 22 mmol/L (ref 20–29)
Calcium: 10.2 mg/dL (ref 8.7–10.2)
Chloride: 99 mmol/L (ref 96–106)
Creatinine, Ser: 0.75 mg/dL (ref 0.57–1.00)
Globulin, Total: 2.6 g/dL (ref 1.5–4.5)
Glucose: 93 mg/dL (ref 70–99)
Potassium: 4.1 mmol/L (ref 3.5–5.2)
Sodium: 136 mmol/L (ref 134–144)
Total Protein: 7.7 g/dL (ref 6.0–8.5)
eGFR: 101 mL/min/{1.73_m2} (ref >59)

## 2022-10-27 LAB — LIPID PANEL W/O CHOL/HDL RATIO
Cholesterol, Total: 221 mg/dL — ABNORMAL HIGH (ref 100–199)
HDL: 50 mg/dL (ref >39)
LDL Chol Calc (NIH): 128 mg/dL — ABNORMAL HIGH (ref 0–99)
Triglycerides: 241 mg/dL — ABNORMAL HIGH (ref 0–149)
VLDL Cholesterol Cal: 43 mg/dL — ABNORMAL HIGH (ref 5–40)

## 2022-10-27 LAB — AMYLASE: Amylase: 68 U/L (ref 31–110)

## 2022-10-27 LAB — TSH: TSH: 2.64 u[IU]/mL (ref 0.450–4.500)

## 2022-10-27 LAB — LIPASE: Lipase: 41 U/L (ref 14–72)

## 2022-10-28 NOTE — Progress Notes (Signed)
Contacted via MyChart   Good afternoon Shirley Rollins, your labs have returned: - CBC shows no infection or anemia. - Kidney function, creatinine and eGFR, remains normal, as is liver function, AST and ALT.  - Thyroid and pancreas labs are normal. - Cholesterol levels remain elevated, but continue recommendation to focus on diet and regular exercise.  Any questions? Keep being amazing!!  Thank you for allowing me to participate in your care.  I appreciate you. Kindest regards, Layten Aiken

## 2022-10-31 LAB — DRUG SCREEN 764883 11+OXYCO+ALC+CRT-BUND
BENZODIAZ UR QL: NEGATIVE ng/mL
Barbiturate: NEGATIVE ng/mL
Cannabinoid Quant, Ur: NEGATIVE ng/mL
Cocaine (Metabolite): NEGATIVE ng/mL
Creatinine: 32.6 mg/dL (ref 20.0–300.0)
Ethanol: NEGATIVE %
Meperidine: NEGATIVE ng/mL
Methadone Screen, Urine: NEGATIVE ng/mL
OPIATE SCREEN URINE: NEGATIVE ng/mL
Oxycodone/Oxymorphone, Urine: NEGATIVE ng/mL
Phencyclidine: NEGATIVE ng/mL
Propoxyphene: NEGATIVE ng/mL
Tramadol: NEGATIVE ng/mL
pH, Urine: 6.3 (ref 4.5–8.9)

## 2022-10-31 LAB — DRUG PROFILE 799016
Amphetamine GC/MS Conf: 2160 ng/mL
Amphetamine: POSITIVE — AB
Amphetamines: POSITIVE — AB
Methamphetamine: NEGATIVE

## 2022-11-01 ENCOUNTER — Other Ambulatory Visit: Payer: Self-pay | Admitting: Nurse Practitioner

## 2022-11-01 ENCOUNTER — Other Ambulatory Visit (HOSPITAL_COMMUNITY): Payer: Self-pay

## 2022-11-01 LAB — CYTOLOGY - PAP
Comment: NEGATIVE
Diagnosis: NEGATIVE
High risk HPV: NEGATIVE

## 2022-11-01 MED ORDER — METRONIDAZOLE 500 MG PO TABS
500.0000 mg | ORAL_TABLET | Freq: Two times a day (BID) | ORAL | 0 refills | Status: AC
  Filled 2022-11-01: qty 14, 7d supply, fill #0

## 2022-11-01 NOTE — Progress Notes (Signed)
Contacted via MyChart   Good morning Shirley Rollins, your pap has returned and overall is negative for cancerous findings.  Great news!!  There is some bacterial vaginosis noted, this is not a sexually transmitted infection, but an overgrowth of our normal bacteria.  I will send in Flagyl 500 MG BID for 7 days to take for this.  Any questions?

## 2022-11-05 ENCOUNTER — Encounter: Payer: Self-pay | Admitting: Nurse Practitioner

## 2022-11-07 ENCOUNTER — Ambulatory Visit (INDEPENDENT_AMBULATORY_CARE_PROVIDER_SITE_OTHER): Payer: 59 | Admitting: Nurse Practitioner

## 2022-11-07 ENCOUNTER — Encounter: Payer: Self-pay | Admitting: Nurse Practitioner

## 2022-11-07 ENCOUNTER — Other Ambulatory Visit (HOSPITAL_COMMUNITY): Payer: Self-pay

## 2022-11-07 VITALS — BP 108/74 | HR 96 | Temp 98.3°F | Wt 136.8 lb

## 2022-11-07 DIAGNOSIS — L509 Urticaria, unspecified: Secondary | ICD-10-CM | POA: Insufficient documentation

## 2022-11-07 MED ORDER — EPINEPHRINE 0.3 MG/0.3ML IJ SOAJ
0.3000 mg | INTRAMUSCULAR | 0 refills | Status: DC | PRN
  Filled 2022-11-07: qty 2, 3d supply, fill #0

## 2022-11-07 NOTE — Progress Notes (Signed)
BP 108/74   Pulse 96   Temp 98.3 F (36.8 C) (Oral)   Wt 136 lb 12.8 oz (62.1 kg)   LMP 10/07/2022 (Exact Date)   SpO2 98%   BMI 22.56 kg/m    Subjective:    Patient ID: Shirley Rollins, female    DOB: 03/30/1980, 42 y.o.   MRN: [id number]  HPI: Shirley Rollins is a 42 y.o. female  Chief Complaint  Patient presents with   Allergic Reaction   ALLERGIC REACTION Had allergic reaction on Friday morning with bilateral eye swelling and hives on neck area.  This resolved with Zyrtec, but then on way to work her face began to get tingly and felt swollen.  Was seen by a [name] at symptom management clinic + told to take Pepcid and Benadryl.  Symptoms overall improved.  The only thing she ate different prior to this was red meat for dinner the night before.  Does not recall history of tick bite.  No other recent changes or new products used.  Had not started Wellbutrin at that time or started Flagyl.   Duration: days Runny nose: none Nasal congestion: no Nasal itching: no Sneezing: no Eye swelling, itching or discharge: yes Post nasal drip: no Cough: no Sinus pressure: no  Ear pain: none Ear pressure: none Fever: no subjective Symptoms occur seasonally: no Symptoms occur perenially: no Satisfied with current treatment: no Allergist evaluation in past: no Allergen injection immunotherapy: no Recurrent sinus infections: no ENT evaluation in past: no Known environmental allergy: no Indoor pets: yes -- dog, she did pull tick off him a couple weeks ago History of asthma: no Current allergy medications: Zyrtec and Pepcid  Relevant past medical, surgical, family and social history reviewed and updated as indicated. Interim medical history since our last visit reviewed. Allergies and medications reviewed and updated.  Review of Systems  Constitutional:  Negative for activity change, appetite change, diaphoresis, fatigue and fever.  Respiratory:  Negative for cough, chest  tightness and shortness of breath.   Cardiovascular:  Negative for chest pain, palpitations and leg swelling.  Gastrointestinal: Negative.   Neurological: Negative.   Psychiatric/Behavioral: Negative.     Per HPI unless specifically indicated above     Objective:    BP 108/74   Pulse 96   Temp 98.3 F (36.8 C) (Oral)   Wt 136 lb 12.8 oz (62.1 kg)   LMP 10/07/2022 (Exact Date)   SpO2 98%   BMI 22.56 kg/m   Wt Readings from Last 3 Encounters:  11/07/22 136 lb 12.8 oz (62.1 kg)  10/26/22 132 lb 6.4 oz (60.1 kg)  10/25/21 113 lb 6.4 oz (51.4 kg)    Physical Exam Vitals and nursing note reviewed.  Constitutional:      General: She is awake. She is not in acute distress.    Appearance: She is well-developed and well-groomed. She is not ill-appearing or toxic-appearing.  HENT:     Head: Normocephalic.     Right Ear: Hearing and external ear normal.     Left Ear: Hearing and external ear normal.  Eyes:     General: Lids are normal.        Right eye: No discharge.        Left eye: No discharge.     Conjunctiva/sclera: Conjunctivae normal.     Pupils: Pupils are equal, round, and reactive to light.  Neck:     Thyroid: No thyromegaly.     Vascular: No carotid bruit.  Cardiovascular:     Rate and Rhythm: Normal rate and regular rhythm.     Heart sounds: Normal heart sounds. No murmur heard.    No gallop.  Pulmonary:     Effort: Pulmonary effort is normal. No accessory muscle usage or respiratory distress.     Breath sounds: Normal breath sounds.  Abdominal:     General: Bowel sounds are normal. There is no distension.     Palpations: Abdomen is soft.     Tenderness: There is no abdominal tenderness.  Musculoskeletal:     Cervical back: Normal range of motion and neck supple.     Right lower leg: No edema.     Left lower leg: No edema.  Lymphadenopathy:     Cervical: No cervical adenopathy.  Skin:    General: Skin is warm and dry.  Neurological:     Mental Status:  She is alert and oriented to person, place, and time.     Deep Tendon Reflexes: Reflexes are normal and symmetric.     Reflex Scores:      Brachioradialis reflexes are 2+ on the right side and 2+ on the left side.      Patellar reflexes are 2+ on the right side and 2+ on the left side. Psychiatric:        Attention and Perception: Attention normal.        Mood and Affect: Mood normal.        Speech: Speech normal.        Behavior: Behavior normal. Behavior is cooperative.        Thought Content: Thought content normal.     Results for orders placed or performed in visit on 10/26/22  [id number] 11+Oxyco+Alc+Crt-Bund  Result Value Ref Range   Ethanol Negative Cutoff=0.020 %   Amphetamines, Urine See Final Results Cutoff=1000 ng/mL   Barbiturate Negative Cutoff=200 ng/mL   BENZODIAZ UR QL Negative Cutoff=200 ng/mL   Cannabinoid Quant, Ur Negative Cutoff=50 ng/mL   Cocaine (Metabolite) Negative Cutoff=300 ng/mL   OPIATE SCREEN URINE Negative Cutoff=300 ng/mL   Oxycodone/Oxymorphone, Urine Negative Cutoff=300 ng/mL   Phencyclidine Negative Cutoff=25 ng/mL   Methadone Screen, Urine Negative Cutoff=300 ng/mL   Propoxyphene Negative Cutoff=300 ng/mL   Meperidine Negative Cutoff=200 ng/mL   Tramadol Negative Cutoff=200 ng/mL   Creatinine 32.6 20.0 - 300.0 mg/dL   pH, Urine 6.3 4.5 - 8.9  CBC with Differential/Platelet  Result Value Ref Range   WBC 5.6 3.4 - 10.8 x10E3/uL   RBC 4.46 3.77 - 5.28 x10E6/uL   Hemoglobin 14.0 11.1 - 15.9 g/dL   Hematocrit 14.7 82.9 - 46.6 %   MCV 98 (H) 79 - 97 fL   MCH 31.4 26.6 - 33.0 pg   MCHC 32.0 31.5 - 35.7 g/dL   RDW 56.2 13.0 - 86.5 %   Platelets 337 150 - 450 x10E3/uL   Neutrophils 47 Not Estab. %   Lymphs 41 Not Estab. %   Monocytes 10 Not Estab. %   Eos 1 Not Estab. %   Basos 1 Not Estab. %   Neutrophils Absolute 2.6 1.4 - 7.0 x10E3/uL   Lymphocytes Absolute 2.3 0.7 - 3.1 x10E3/uL   Monocytes Absolute 0.5 0.1 - 0.9 x10E3/uL   EOS (ABSOLUTE)  0.1 0.0 - 0.4 x10E3/uL   Basophils Absolute 0.1 0.0 - 0.2 x10E3/uL   Immature Granulocytes 0 Not Estab. %   Immature Grans (Abs) 0.0 0.0 - 0.1 x10E3/uL  Comprehensive metabolic panel  Result Value Ref Range  Glucose 93 70 - 99 mg/dL   BUN 10 6 - 24 mg/dL   Creatinine, Ser 6.21 0.57 - 1.00 mg/dL   eGFR 308 >65 HQ/ION/6.29   BUN/Creatinine Ratio 13 9 - 23   Sodium 136 134 - 144 mmol/L   Potassium 4.1 3.5 - 5.2 mmol/L   Chloride 99 96 - 106 mmol/L   CO2 22 20 - 29 mmol/L   Calcium 10.2 8.7 - 10.2 mg/dL   Total Protein 7.7 6.0 - 8.5 g/dL   Albumin 5.1 (H) 3.9 - 4.9 g/dL   Globulin, Total 2.6 1.5 - 4.5 g/dL   Bilirubin Total 0.5 0.0 - 1.2 mg/dL   Alkaline Phosphatase 45 44 - 121 IU/L   AST 23 0 - 40 IU/L   ALT 23 0 - 32 IU/L  Lipid Panel w/o Chol/HDL Ratio  Result Value Ref Range   Cholesterol, Total 221 (H) 100 - 199 mg/dL   Triglycerides 528 (H) 0 - 149 mg/dL   HDL 50 >41 mg/dL   VLDL Cholesterol Cal 43 (H) 5 - 40 mg/dL   LDL Chol Calc (NIH) 324 (H) 0 - 99 mg/dL  TSH  Result Value Ref Range   TSH 2.640 0.450 - 4.500 uIU/mL  Amylase  Result Value Ref Range   Amylase 68 31 - 110 U/L  Lipase  Result Value Ref Range   Lipase 41 14 - 72 U/L  Drug Profile [id number]  Result Value Ref Range   Amphetamines Positive (A) Cutoff=1000   Amphetamine Positive (A)    Amphetamine GC/MS Conf 2,160 Cutoff=500 ng/mL   Methamphetamine Negative Cutoff=500  Cytology - PAP  Result Value Ref Range   High risk HPV Negative    Adequacy      Satisfactory for evaluation; transformation zone component PRESENT.   Diagnosis      - Negative for intraepithelial lesion or malignancy (NILM)   Microorganisms Shift in flora suggestive of bacterial vaginosis    Comment Normal Reference Range HPV - Negative       Assessment & Plan:   Problem List Items Addressed This Visit       Musculoskeletal and Integument   Hives - Primary    Acute with recent allergic reaction, the only new thing she had was  red meat the night before.  Significant swelling noted to eyes on pictures. Currently she is stable.  Will send in Epi Pen, until we determine cause of this reaction.  Obtain Alpha Gal testing and food allergy testing today.  Determine next steps after these return.      Relevant Orders   Alpha-Gal Panel   Allergens(14)     Follow up plan: Return for as on chart.

## 2022-11-07 NOTE — Patient Instructions (Signed)
Alpha-gal Syndrome Alpha-gal syndrome (AGS) is an allergic reaction to a type of sugar commonly called alpha-gal. It is found in the meat and organ meats of mammals, such as cows, pigs, and sheep. It may also be found in products that come from animals, such as gelatin, medicines, medicine capsules, some milk products, vaccines, and cosmetics. AGS causes an allergic reaction that can be immediate or delayed for several hours and can range from mild to severe. A mild reaction may cause nausea, vomiting, or an itchy rash (hives). A severe reaction can cause breathing difficulties or loss of consciousness (anaphylaxis). This can be life-threatening. What are the causes? This allergy is first triggered by a tick bite from a lone star or blackleg tick. These ticks bite animals, such as cows, pigs, or sheep, and pick up the alpha-gal sugar from their blood. If the same tick bites you, it may cause your body's defense system (immune system) to produce antibodies to alpha-gal and cause the allergic reaction. What increases the risk? People who live in areas of the [place] where the lone star tick is common are at highest risk, these areas may include: [place]heastern. [place]. [place] into parts of [place]. People who are hunters and people who work in jobs that involve caring for trees (foresters) have an increased risk of this condition. What are the signs or symptoms? AGS may not cause an allergic reaction every time you eat red meat or come into contact with alpha-gal. If you do have a reaction, symptoms may include: Hives. Severe stomachache. Nausea or vomiting. Swelling of the lips, face, tongue, or throat. Making a high-pitched whistling sound when you breathe, most often when you breathe out (wheezing). Sneezing and runny nose. Headache. Symptoms of anaphylaxis may include: Difficulty breathing. Difficulty swallowing. Dizziness. Fainting. This may happen due to a sudden drop in  blood pressure. You may have AGS if you had anaphylaxis after eating something but do not have any known food allergies. Unlike other food allergies, the reaction does not start soon after the exposure to alpha-gal. There may be a delay of several hours. How is this diagnosed? This condition may be diagnosed based on signs and symptoms of the condition, especially if you have a history of tick bites and a delayed reaction to red meat. You may also have a blood test to check for antibodies to alpha-gal or a skin test to see if there is a reaction to alpha-gal. How is this treated? This condition may be treated by: Avoiding meat and organ meats that may contain alpha-gal. Avoiding medicines or other products that may contain alpha-gal. Using medicines to reduce an allergic reaction. Carrying an epinephrine auto-injector to use in case of a severe AGS reaction. AGS should be treated by an allergist or a health care provider who has experience with AGS. Follow these instructions at home:  Medicines Take over-the-counter and prescription medicines only as told by your health care provider. Follow instructions from your health care provider about when and how to use an epinephrine auto-injector. General instructions Avoid red meat and organ meat, and check food labels for meat-based ingredients in packaged foods such as soup, gravy, and flavoring. Work with your allergist to find what other foods or products you may need to avoid, some people may benefit from avoiding dairy. Keep all follow-up visits. This is important. How is this prevented? Take steps to prevent tick bites as frequent tick bites may increase the risk of an AGS reaction. These steps  include: Avoiding woods and fields with high grass. Wearing clothing protected with the anti-tick chemical permethrin when outdoors in these areas or using a U.S. Environmental Protection Agency-approved insect repellent. Checking your clothing for  ticks before you come back indoors. Checking your body for ticks when you take a shower. Checking your pets for ticks. Where to find more information Centers for Disease Control and Prevention: FootballExhibition.com.br [employer] of Allergy and Infectious Diseases: https://hahn.com/ Contact a health care provider if: You have any signs or symptoms of AGS food allergy. You have a tick bite that causes a skin reaction. Get help right away if: You have a severe AGS reaction. You have trouble swallowing or breathing. These symptoms may represent a serious problem that is an emergency. Do not wait to see if the symptoms will go away. Get medical help right away. Call your local emergency services (911 in the [place] Do not drive yourself to the hospital. Summary AGS is a food allergy caused by a tick bite. Red meats, organ meats, and other products or medicines may trigger the alpha-gal reaction. AGS reactions can be mild or severe. If you have AGS, you should not eat red meat, organ meat, or products that may contain alpha-gal. Avoiding tick bites prevents AGS and more serious AGS reactions. This information is not intended to replace advice given to you by your health care provider. Make sure you discuss any questions you have with your health care provider. Document Revised: 05/31/2020 Document Reviewed: 05/31/2020 Elsevier Patient Education  2024 [employer].

## 2022-11-07 NOTE — Assessment & Plan Note (Signed)
Acute with recent allergic reaction, the only new thing she had was red meat the night before.  Significant swelling noted to eyes on pictures. Currently she is stable.  Will send in Epi Pen, until we determine cause of this reaction.  Obtain Alpha Gal testing and food allergy testing today.  Determine next steps after these return.

## 2022-11-08 ENCOUNTER — Other Ambulatory Visit (HOSPITAL_COMMUNITY): Payer: Self-pay

## 2022-11-10 LAB — ALLERGENS(14)
Allergen Corn, IgE: <0.1 kU/L
Allergen Salmon IgE: <0.1 kU/L
Chocolate/Cacao IgE: <0.1 kU/L
Codfish IgE: <0.1 kU/L
Egg, Whole IgE: <0.1 kU/L
F037-IgE Mussel: <0.1 kU/L
Milk IgE: <0.1 kU/L
Peanut IgE: <0.1 kU/L
Shrimp IgE: <0.1 kU/L
Soybean IgE: <0.1 kU/L
Tuna: <0.1 kU/L
Wheat IgE: <0.1 kU/L

## 2022-11-10 LAB — ALPHA-GAL PANEL
Allergen Lamb IgE: <0.1 kU/L
Beef IgE: <0.1 kU/L
IgE (Immunoglobulin E), Serum: 14 [IU]/mL (ref 6–495)
O215-IgE Alpha-Gal: <0.1 kU/L
Pork IgE: <0.1 kU/L

## 2022-11-10 NOTE — Progress Notes (Signed)
Contacted via MyChart   Good evening Shirley Rollins, your allergy testing has returned and all is negative, including Alpha Gal.  We may need to send you to allergist if an event like this happens again to get more detailed testing.  Any questions? Keep being stellar!!  Thank you for allowing me to participate in your care.  I appreciate you. Kindest regards, Danai Gotto

## 2022-11-19 ENCOUNTER — Other Ambulatory Visit (HOSPITAL_COMMUNITY): Payer: Self-pay

## 2022-11-21 ENCOUNTER — Other Ambulatory Visit (HOSPITAL_COMMUNITY): Payer: Self-pay

## 2022-11-25 NOTE — Patient Instructions (Signed)
Be Involved in Caring For Your Health:  Taking Medications When medications are taken as directed, they can greatly improve your health. But if they are not taken as prescribed, they may not work. In some cases, not taking them correctly can be harmful. To help ensure your treatment remains effective and safe, understand your medications and how to take them. Bring your medications to each visit for review by your provider.  Your lab results, notes, and after visit summary will be available on My Chart. We strongly encourage you to use this feature. If lab results are abnormal the clinic will contact you with the appropriate steps. If the clinic does not contact you assume the results are satisfactory. You can always view your results on My Chart. If you have questions regarding your health or results, please contact the clinic during office hours. You can also ask questions on My Chart.  We at [facility] are grateful that you chose [place] to provide your care. We strive to provide evidence-based and compassionate care and are always looking for feedback. If you get a survey from the clinic please complete this so we can hear your opinions.  Managing Depression, Adult Depression is a mental health condition that affects your thoughts, feelings, and actions. Being diagnosed with depression can bring you relief if you did not know why you have felt or behaved a certain way. It could also leave you feeling overwhelmed. Finding ways to manage your symptoms can help you feel more positive about your future. How to manage lifestyle changes Being depressed is difficult. Depression can increase the level of everyday stress. Stress can make depression symptoms worse. You may believe your symptoms cannot be managed or will never improve. However, there are many things you can try to help manage your symptoms. There is hope. Managing stress  Stress is your body's reaction to life changes and events,  both good and bad. Stress can add to your feelings of depression. Learning to manage your stress can help lessen your feelings of depression. Try some of the following approaches to reducing your stress (stress reduction techniques): Listen to music that you enjoy and that inspires you. Try using a meditation app or take a meditation class. Develop a practice that helps you connect with your spiritual self. Walk in nature, pray, or go to a place of worship. Practice deep breathing. To do this, inhale slowly through your nose. Pause at the top of your inhale for a few seconds and then exhale slowly, letting yourself relax. Repeat this three or four times. Practice yoga to help relax and work your muscles. Choose a stress reduction technique that works for you. These techniques take time and practice to develop. Set aside 5-15 minutes a day to do them. Therapists can offer training in these techniques. Do these things to help manage stress: Keep a journal. Know your limits. Set healthy boundaries for yourself and others, such as saying "no" when you think something is too much. Pay attention to how you react to certain situations. You may not be able to control everything, but you can change your reaction. Add humor to your life by watching funny movies or shows. Make time for activities that you enjoy and that relax you. Spend less time using electronics, especially at night before bed. The light from screens can make your brain think it is time to get up rather than go to bed.  Medicines Medicines, such as antidepressants, are often a part of  treatment for depression. Talk with your pharmacist or health care provider about all the medicines, supplements, and herbal products that you take, their possible side effects, and what medicines and other products are safe to take together. Make sure to report any side effects you may have to your health care provider. Relationships Your health care  provider may suggest family therapy, couples therapy, or individual therapy as part of your treatment. How to recognize changes Everyone responds differently to treatment for depression. As you recover from depression, you may start to: Have more interest in doing activities. Feel more hopeful. Have more energy. Eat a more regular amount of food. Have better mental focus. It is important to recognize if your depression is not getting better or is getting worse. The symptoms you had in the beginning may return, such as: Feeling tired. Eating too much or too little. Sleeping too much or too little. Feeling restless, agitated, or hopeless. Trouble focusing or making decisions. Having unexplained aches and pains. Feeling irritable, angry, or aggressive. If you or your family members notice these symptoms coming back, let your health care provider know right away. Follow these instructions at home: Activity Try to get some form of exercise each day, such as walking. Try yoga, mindfulness, or other stress reduction techniques. Participate in group activities if you are able. Lifestyle Get enough sleep. Cut down on or stop using caffeine, tobacco, alcohol, and any other harmful substances. Eat a healthy diet that includes plenty of vegetables, fruits, whole grains, low-fat dairy products, and lean protein. Limit foods that are high in solid fats, added sugar, or salt (sodium). General instructions Take over-the-counter and prescription medicines only as told by your health care provider. Keep all follow-up visits. It is important for your health care provider to check on your mood, behavior, and medicines. Your health care provider may need to make changes to your treatment. Where to find support Talking to others  Friends and family members can be sources of support and guidance. Talk to trusted friends or family members about your condition. Explain your symptoms and let them know that you  are working with a health care provider to treat your depression. Tell friends and family how they can help. Finances Find mental health providers that fit with your financial situation. Talk with your health care provider if you are worried about access to food, housing, or medicine. Call your insurance company to learn about your co-pays and prescription plan. Where to find more information You can find support in your area from: Anxiety and Depression Association of America (ADAA): adaa.org Mental Health America: mentalhealthamerica.net [employer] on Mental Illness: nami.org Contact a health care provider if: You stop taking your antidepressant medicines, and you have any of these symptoms: Nausea. Headache. Light-headedness. Chills and body aches. Not being able to sleep (insomnia). You or your friends and family think your depression is getting worse. Get help right away if: You have thoughts of hurting yourself or others. Get help right away if you feel like you may hurt yourself or others, or have thoughts about taking your own life. Go to your nearest emergency room or: Call 911. Call the National Suicide Prevention Lifeline at 6623140421 or 988. This is open 24 hours a day. Text the Crisis Text Line at 212-242-5991. This information is not intended to replace advice given to you by your health care provider. Make sure you discuss any questions you have with your health care provider. Document Revised: 06/20/2021 Document Reviewed:  06/20/2021 Elsevier Patient Education  2024 [employer].

## 2022-11-26 ENCOUNTER — Encounter: Payer: Self-pay | Admitting: Nurse Practitioner

## 2022-11-26 ENCOUNTER — Telehealth (INDEPENDENT_AMBULATORY_CARE_PROVIDER_SITE_OTHER): Payer: 59 | Admitting: Nurse Practitioner

## 2022-11-26 DIAGNOSIS — F988 Other specified behavioral and emotional disorders with onset usually occurring in childhood and adolescence: Secondary | ICD-10-CM | POA: Diagnosis not present

## 2022-11-26 DIAGNOSIS — F419 Anxiety disorder, unspecified: Secondary | ICD-10-CM

## 2022-11-26 DIAGNOSIS — F32A Depression, unspecified: Secondary | ICD-10-CM

## 2022-11-26 NOTE — Progress Notes (Signed)
There were no vitals taken for this visit.   Subjective:    Patient ID: Shirley Rollins, female    DOB: 13-Jun-1980, 42 y.o.   MRN: [id number]  HPI: Shirley Rollins is a 43 y.o. female  Chief Complaint  Patient presents with   Mood    Patient says she is doing good and feeling much better.    This visit was completed via video visit through MyChart due to the restrictions of the COVID-19 pandemic. All issues as above were discussed and addressed. Physical exam was done as above through visual confirmation on video through MyChart. If it was felt that the patient should be evaluated in the office, they were directed there. The patient verbally consented to this visit. Location of the patient: home Location of the provider: work Those involved with this call:  Provider: Aura Dials, DNP CMA: Malen Gauze CMA Front Desk/Registration: Suzzanne Cloud  Time spent on call:  21 minutes with patient face to face via video conference. More than 50% of this time was spent in counseling and coordination of care. 15 minutes total spent in review of patient's record and preparation of their chart.  I verified patient identity using two factors (patient name and date of birth). Patient consents verbally to being seen via telemedicine visit today.    ADD & DEPRESSION Follow-up for addition of Wellbutrin.  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  She continues on Adderall XR 25 MG daily.  She been on this regimen for > 9 years and overall works well.  Last fill on PDMP review 11/21/22.    Continues on Effexor XR 150 MG daily for mood + Vistaril as needed. Status: stable Satisfied with current therapy: yes Medication compliance:  good compliance Controlled substance contract: yes Previous psychiatry evaluation: no Previous medications: yes adderall XR   Taking meds on weekends/vacations: yes Work/school  performance:  good Difficulty sustaining attention/completing tasks: occasional Distracted by extraneous stimuli: occasional Does not listen when spoken to: no  Fidgets with hands or feet: no Unable to stay in seat: no Blurts out/interrupts others: no ADHD Medication Side Effects: no    Decreased appetite: no    Headache: no    Sleeping disturbance pattern: no    Irritability: no    Rebound effects (worse than baseline) off medication: no    Anxiousness: no    Dizziness: no    Tics: no    11/26/2022   10:59 AM 10/26/2022    9:10 AM 09/24/2022    8:57 AM 04/02/2022    8:42 AM 10/25/2021    4:06 PM  Depression screen PHQ 2/9  Decreased Interest 0 1 3 3 1   Down, Depressed, Hopeless 1 2 1 2 2   PHQ - 2 Score 1 3 4 5 3   Altered sleeping 0 2 1 3 1   Tired, decreased energy 2 3 1 3 3   Change in appetite 0 0 0 0 0  Feeling bad or failure about yourself  0 1 0 3 1  Trouble concentrating 2 2 1 3 2   Moving slowly or fidgety/restless 0 0 0 0 0  Suicidal thoughts 0 0 0 0 0  PHQ-9 Score 5 11 7 17 10   Difficult doing work/chores Not difficult at all Somewhat difficult Somewhat difficult Somewhat difficult Somewhat difficult       11/26/2022   11:00 AM 10/26/2022  9:10 AM 09/24/2022    8:59 AM 04/02/2022    8:43 AM  GAD 7 : Generalized Anxiety Score  Nervous, Anxious, on Edge 2 2 2 3   Control/stop worrying 2 1 1 3   Worry too much - different things 2 3 0 3  Trouble relaxing 1 1 1 3   Restless 1 2 0 3  Easily annoyed or irritable 2 3 1 3   Afraid - awful might happen 0 0 1 3  Total GAD 7 Score 10 12 6 21   Anxiety Difficulty Not difficult at all Very difficult Somewhat difficult Somewhat difficult   Relevant past medical, surgical, family and social history reviewed and updated as indicated. Interim medical history since our last visit reviewed. Allergies and medications reviewed and updated.  Review of Systems  Constitutional:  Negative for activity change, appetite change, diaphoresis,  fatigue and fever.  Respiratory:  Negative for cough, chest tightness and shortness of breath.   Cardiovascular:  Negative for chest pain, palpitations and leg swelling.  Gastrointestinal: Negative.   Psychiatric/Behavioral:  Positive for decreased concentration (occasional). Negative for self-injury, sleep disturbance and suicidal ideas. The patient is not nervous/anxious.    Per HPI unless specifically indicated above     Objective:    There were no vitals taken for this visit.  Wt Readings from Last 3 Encounters:  11/07/22 136 lb 12.8 oz (62.1 kg)  10/26/22 132 lb 6.4 oz (60.1 kg)  10/25/21 113 lb 6.4 oz (51.4 kg)    Physical Exam Vitals and nursing note reviewed.  Constitutional:      General: She is awake. She is not in acute distress.    Appearance: She is well-developed. She is not ill-appearing.  HENT:     Head: Normocephalic.     Right Ear: Hearing normal.     Left Ear: Hearing normal.  Eyes:     General: Lids are normal.        Right eye: No discharge.        Left eye: No discharge.     Conjunctiva/sclera: Conjunctivae normal.  Pulmonary:     Effort: Pulmonary effort is normal. No accessory muscle usage or respiratory distress.  Musculoskeletal:     Cervical back: Normal range of motion.  Neurological:     Mental Status: She is alert and oriented to person, place, and time.  Psychiatric:        Attention and Perception: Attention normal.        Mood and Affect: Mood normal.        Behavior: Behavior normal. Behavior is cooperative.        Thought Content: Thought content normal.        Judgment: Judgment normal.    Results for orders placed or performed in visit on 11/07/22  Alpha-Gal Panel  Result Value Ref Range   Class Description Allergens Comment    IgE (Immunoglobulin E), Serum 14 6 - 495 IU/mL   Pork IgE <0.10 Class 0 kU/L   Beef IgE <0.10 Class 0 kU/L   Allergen Lamb IgE <0.10 Class 0 kU/L   O215-IgE Alpha-Gal <0.10 Class 0 kU/L  Allergens(14)   Result Value Ref Range   Milk IgE <0.10 Class 0 kU/L   Codfish IgE <0.10 Class 0 kU/L   Wheat IgE <0.10 Class 0 kU/L   Allergen Corn, IgE <0.10 Class 0 kU/L   Peanut IgE <0.10 Class 0 kU/L   Soybean IgE <0.10 Class 0 kU/L   Shrimp IgE <0.10 Class 0  kU/L   F037-IgE Mussel <0.10 Class 0 kU/L   Tuna <0.10 Class 0 kU/L   Allergen Salmon IgE <0.10 Class 0 kU/L   Chocolate/Cacao IgE <0.10 Class 0 kU/L   Egg, Whole IgE <0.10 Class 0 kU/L      Assessment & Plan:   Problem List Items Addressed This Visit       Other   ADD (attention deficit disorder) without hyperactivity    Chronic, ongoing.  UDS due next 10/26/23, and controlled subs agreement up to date.  Has up to date refills present.  Agrees to every 3 month visits, can be virtual if needed.  Her goal in long run is to transition to Effexor only if possible and come off Adderall, discussed this at length with her and recommend no cold [place] stopping but can work on gradual reduction over time with addition of Effexor.      Anxiety and depression - Primary    Chronic, improving with Wellbutrin.  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  Denies SI/HI.  Continue Effexor XR 150 MG daily at this time, Wellbutrin XL 150 MG daily + Vistaril as needed only for panic.  She is aware if needed she can increase Wellbutrin to 300 MG and alert provider if changed.  Educated her on medication and side effects + BLACK BOX warning.  Discussed adding on therapy or visit with psychiatry if worsening mood.           I discussed the assessment and treatment plan with the patient. The patient was provided an opportunity to ask questions and all were answered. The patient agreed with the plan and demonstrated an understanding of the instructions.   The patient was advised to call back or seek an in-person evaluation if the symptoms worsen or if the condition fails to improve as anticipated.   I provided 21+ minutes of time during  this encounter.    Follow up plan: Return in about 2 months (around 01/25/2023) for ADD.

## 2022-11-26 NOTE — Assessment & Plan Note (Signed)
Chronic, improving with Wellbutrin.  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  Denies SI/HI.  Continue Effexor XR 150 MG daily at this time, Wellbutrin XL 150 MG daily + Vistaril as needed only for panic.  She is aware if needed she can increase Wellbutrin to 300 MG and alert provider if changed.  Educated her on medication and side effects + BLACK BOX warning.  Discussed adding on therapy or visit with psychiatry if worsening mood.

## 2022-11-26 NOTE — Progress Notes (Signed)
Appointment scheduled.

## 2022-11-26 NOTE — Assessment & Plan Note (Signed)
Chronic, ongoing.  UDS due next 10/26/23, and controlled subs agreement up to date.  Has up to date refills present.  Agrees to every 3 month visits, can be virtual if needed.  Her goal in long run is to transition to Effexor only if possible and come off Adderall, discussed this at length with her and recommend no cold [place] stopping but can work on gradual reduction over time with addition of Effexor.

## 2022-12-14 ENCOUNTER — Ambulatory Visit
Admission: RE | Admit: 2022-12-14 | Discharge: 2022-12-14 | Disposition: A | Discharge status: Home / Self Care | Payer: 59 | Source: Ambulatory Visit | Attending: Nurse Practitioner | Admitting: Nurse Practitioner

## 2022-12-14 DIAGNOSIS — Z1231 Encounter for screening mammogram for malignant neoplasm of breast: Secondary | ICD-10-CM | POA: Diagnosis not present

## 2022-12-17 IMAGING — MG MM DIGITAL SCREENING BILAT W/ TOMO AND CAD
8 series · 9 of 24 positions shown · non-contrast
Comparison: Previous exam(s).

CLINICAL DATA: Screening.

EXAM:
DIGITAL SCREENING BILATERAL MAMMOGRAM WITH TOMOSYNTHESIS AND CAD
TECHNIQUE: Bilateral screening digital craniocaudal and mediolateral oblique
mammograms were obtained. Bilateral screening digital breast
tomosynthesis was performed. The images were evaluated with
computer-aided detection.

[R MLO synth-2D]
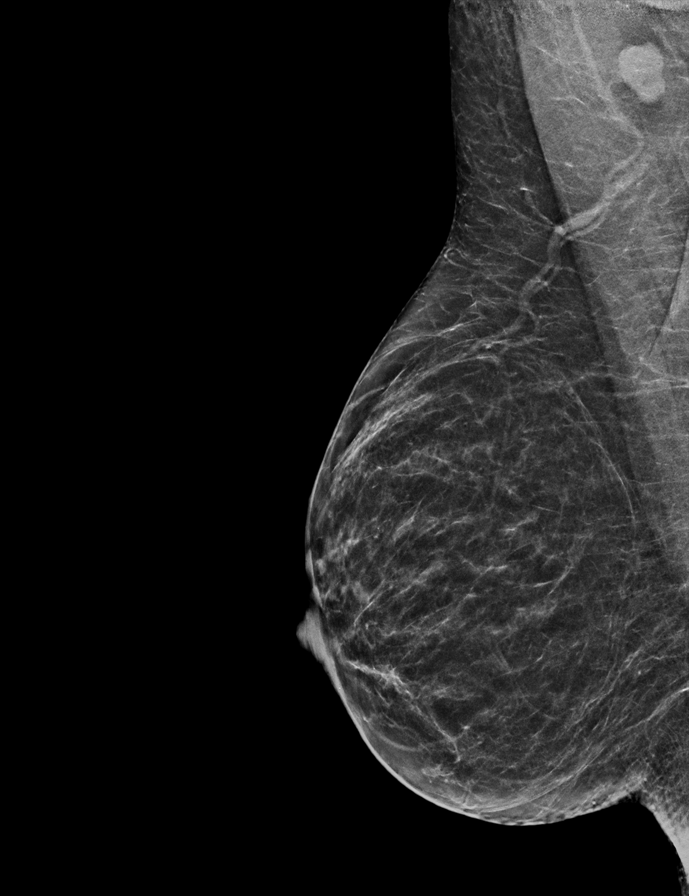

[L CC synth-2D]
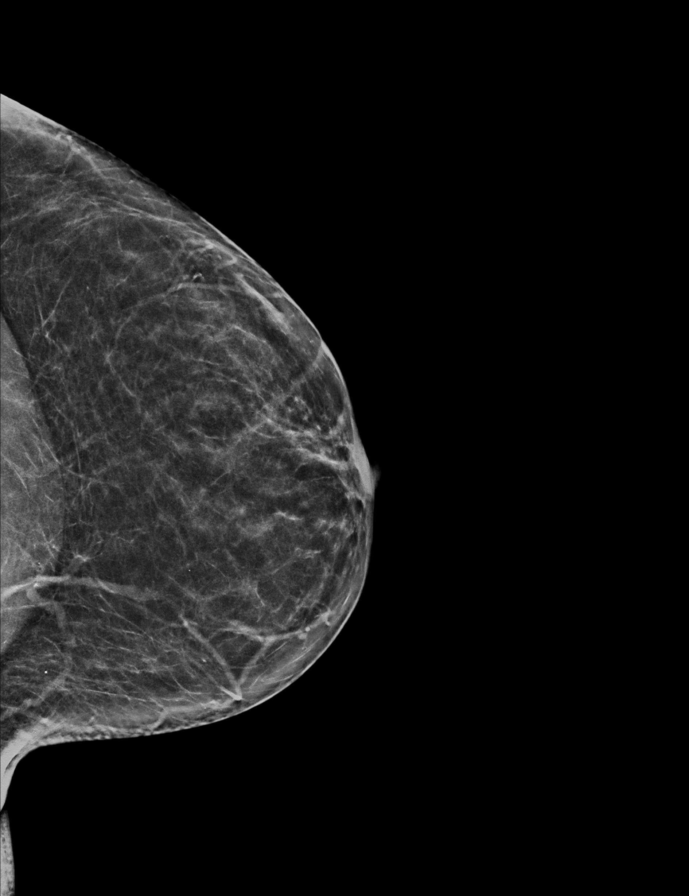

[L MLO synth-2D]
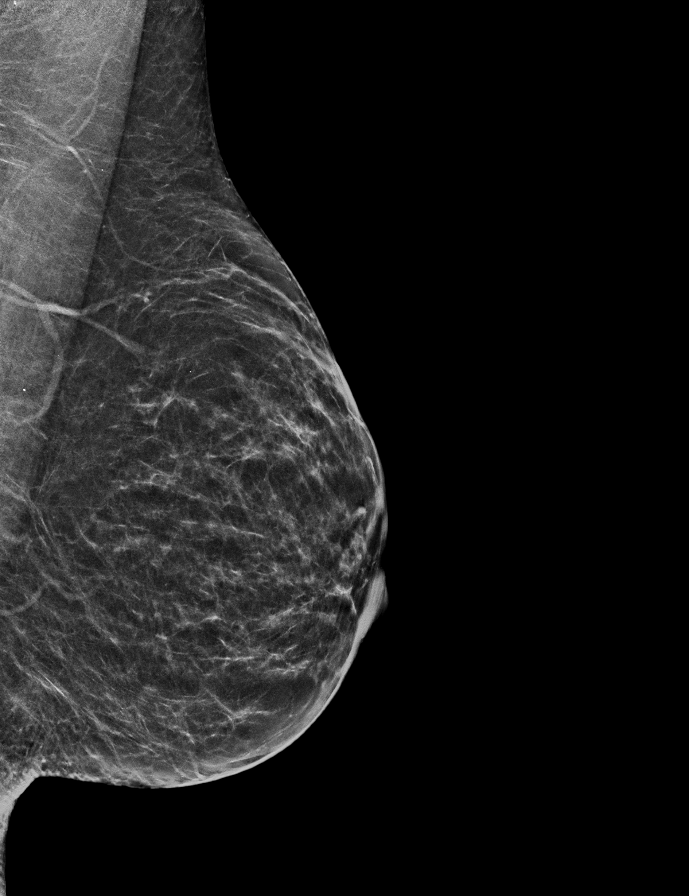

[R CC synth-2D]
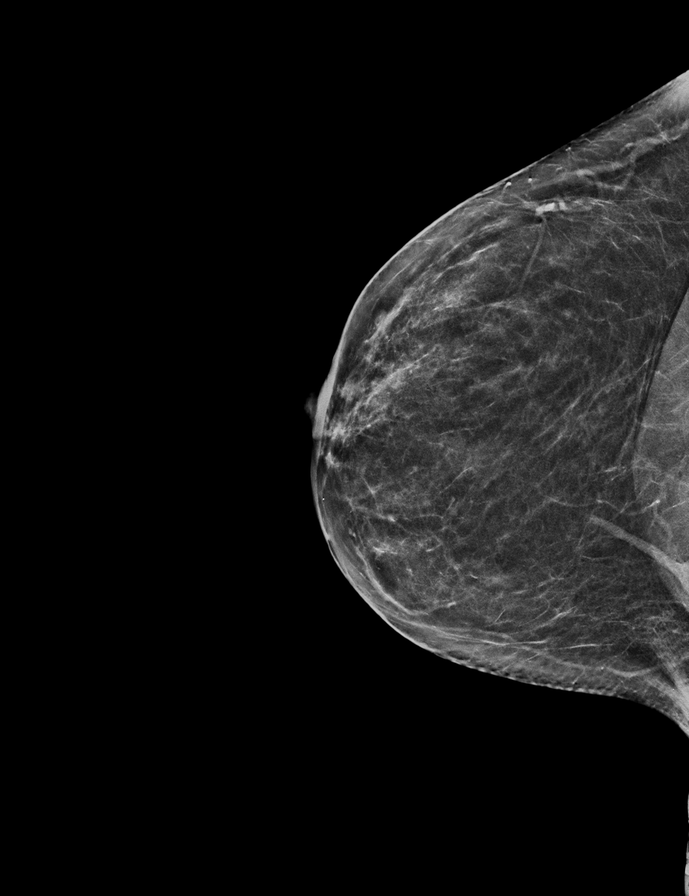

[R MLO tomo · 2 of 52 frames shown]
[frame 17/52]
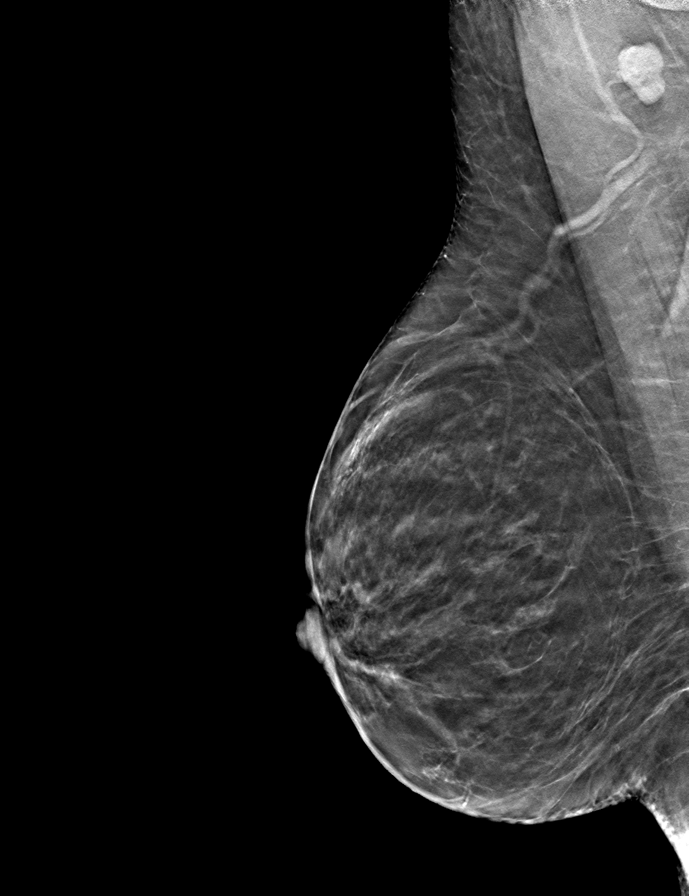
[frame 27/52]
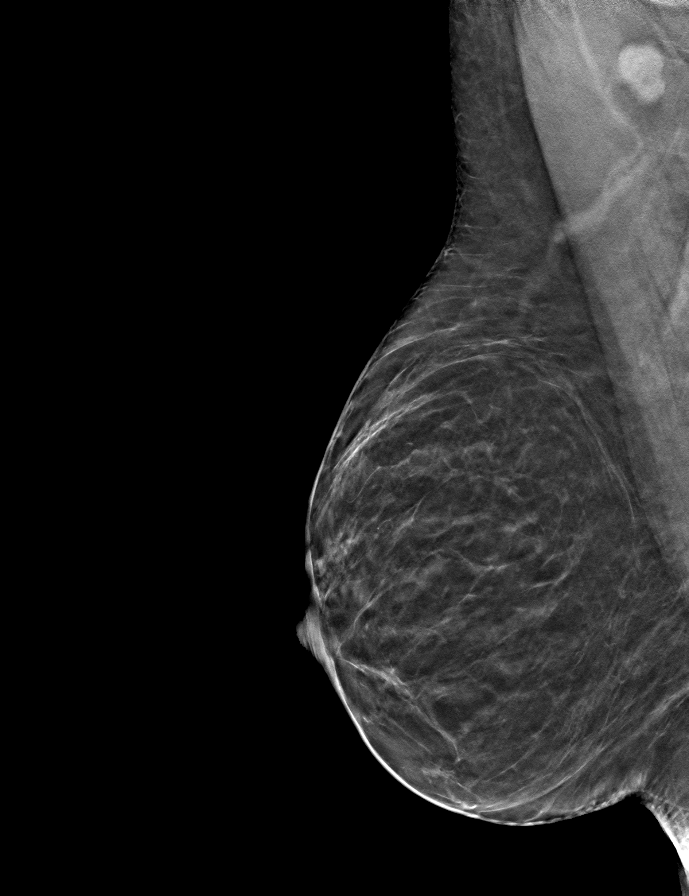

[L MLO tomo · tomo slice 25/50.0]
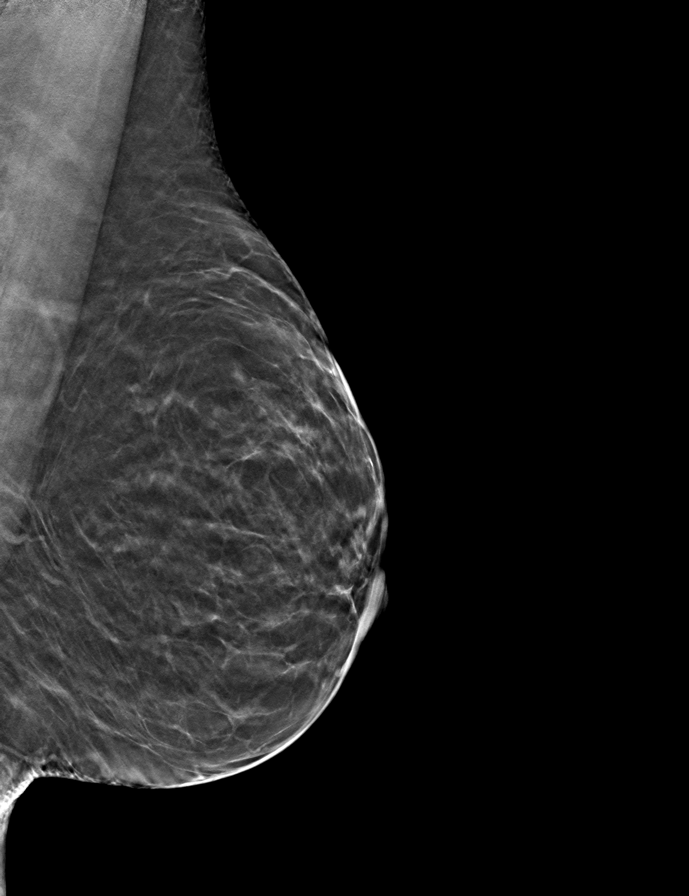

[L CC tomo · tomo slice 27/54.0]
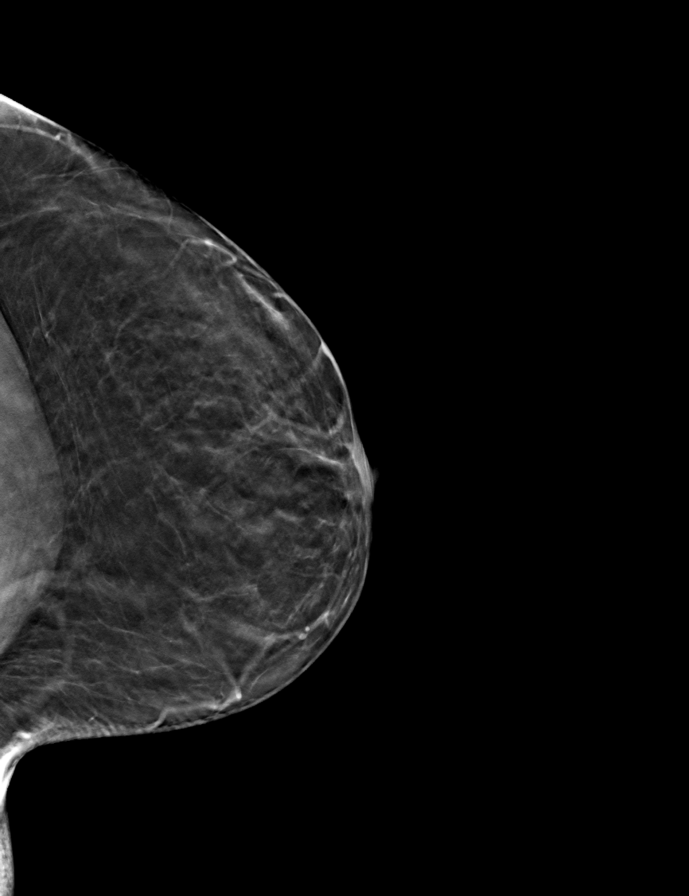

[R CC tomo · tomo slice 27/53.0]
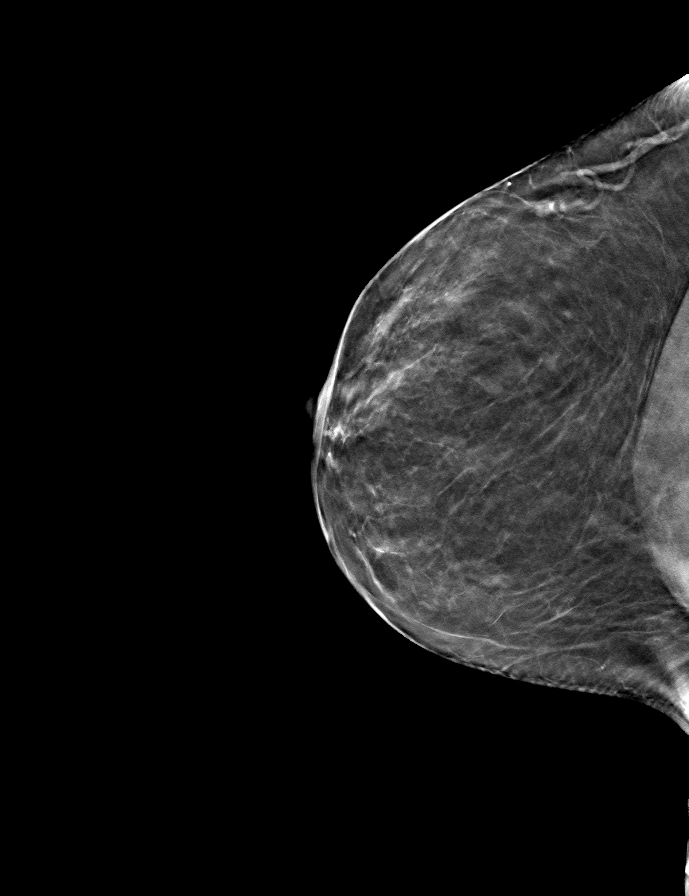

[9 of 24 positions shown; findings below may reference images not displayed]

ACR Breast Density Category b: There are scattered areas of
fibroglandular density.
FINDINGS: There are no findings suspicious for malignancy.
IMPRESSION: No mammographic evidence of malignancy. A result letter of this
screening mammogram will be mailed directly to the patient.

RECOMMENDATION:
Screening mammogram in one year. (Code:51-O-LD2)

BI-RADS CATEGORY  1: Negative.

## 2022-12-18 NOTE — Progress Notes (Signed)
Contacted via MyChart   Normal mammogram, may repeat in one year:)

## 2022-12-19 ENCOUNTER — Emergency Department (HOSPITAL_COMMUNITY): Payer: 59

## 2022-12-19 ENCOUNTER — Emergency Department (HOSPITAL_COMMUNITY)
Admission: EM | Admit: 2022-12-19 | Discharge: 2022-12-19 | Disposition: A | Discharge status: Home / Self Care | Payer: 59 | Attending: Emergency Medicine | Admitting: Emergency Medicine

## 2022-12-19 ENCOUNTER — Other Ambulatory Visit: Payer: Self-pay

## 2022-12-19 DIAGNOSIS — Y92410 Unspecified street and highway as the place of occurrence of the external cause: Secondary | ICD-10-CM | POA: Diagnosis not present

## 2022-12-19 DIAGNOSIS — M542 Cervicalgia: Secondary | ICD-10-CM | POA: Insufficient documentation

## 2022-12-19 DIAGNOSIS — Z87891 Personal history of nicotine dependence: Secondary | ICD-10-CM | POA: Insufficient documentation

## 2022-12-19 DIAGNOSIS — M7989 Other specified soft tissue disorders: Secondary | ICD-10-CM | POA: Insufficient documentation

## 2022-12-19 DIAGNOSIS — M79641 Pain in right hand: Secondary | ICD-10-CM | POA: Diagnosis not present

## 2022-12-19 DIAGNOSIS — M546 Pain in thoracic spine: Secondary | ICD-10-CM | POA: Insufficient documentation

## 2022-12-19 MED ORDER — KETOROLAC TROMETHAMINE 60 MG/2ML IM SOLN
15.0000 mg | Freq: Once | INTRAMUSCULAR | Status: AC
  Administered 2022-12-19: 15 mg via INTRAMUSCULAR
  Filled 2022-12-19: qty 2

## 2022-12-19 MED ORDER — HYDROCODONE-ACETAMINOPHEN 5-325 MG PO TABS
1.0000 | ORAL_TABLET | Freq: Once | ORAL | Status: AC
  Administered 2022-12-19: 1 via ORAL
  Filled 2022-12-19: qty 1

## 2022-12-19 MED ORDER — KETOROLAC TROMETHAMINE 15 MG/ML IJ SOLN
15.0000 mg | Freq: Once | INTRAMUSCULAR | Status: DC
  Filled 2022-12-19: qty 1
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
## 2022-12-26 ENCOUNTER — Other Ambulatory Visit: Payer: Self-pay | Admitting: Nurse Practitioner

## 2022-12-26 ENCOUNTER — Other Ambulatory Visit (HOSPITAL_COMMUNITY): Payer: Self-pay

## 2022-12-26 ENCOUNTER — Other Ambulatory Visit: Payer: Self-pay

## 2022-12-27 ENCOUNTER — Other Ambulatory Visit (HOSPITAL_COMMUNITY): Payer: Self-pay

## 2022-12-27 MED ORDER — VENLAFAXINE HCL ER 150 MG PO CP24
150.0000 mg | ORAL_CAPSULE | Freq: Every day | ORAL | 1 refills | Status: DC
  Filled 2022-12-27: qty 90, 90d supply, fill #0

## 2022-12-27 NOTE — Telephone Encounter (Signed)
Requested Prescriptions  Pending Prescriptions Disp Refills   venlafaxine XR (EFFEXOR-XR) 150 MG 24 hr capsule 90 capsule 1    Sig: Take 1 capsule (150 mg total) by mouth daily with breakfast.     Psychiatry: Antidepressants - SNRI - desvenlafaxine & venlafaxine Failed - 12/26/2022 12:58 PM      Failed - Lipid Panel in normal range within the last 12 months    Cholesterol, Total  Date Value Ref Range Status  10/26/2022 221 (H) 100 - 199 mg/dL Final   LDL Chol Calc (NIH)  Date Value Ref Range Status  10/26/2022 128 (H) 0 - 99 mg/dL Final   HDL  Date Value Ref Range Status  10/26/2022 50 >39 mg/dL Final   Triglycerides  Date Value Ref Range Status  10/26/2022 241 (H) 0 - 149 mg/dL Final         Passed - Cr in normal range and within 360 days    Creatinine  Date Value Ref Range Status  10/26/2022 32.6 20.0 - 300.0 mg/dL Final   Creatinine, Ser  Date Value Ref Range Status  10/26/2022 0.75 0.57 - 1.00 mg/dL Final         Passed - Completed PHQ-2 or PHQ-9 in the last 360 days      Passed - Last BP in normal range    BP Readings from Last 1 Encounters:  12/19/22 122/88         Passed - Valid encounter within last 6 months    Recent Outpatient Visits           1 month ago Anxiety and depression   [place] [facility] [place], [place] T, NP   1 month ago Hives   [place] [facility] [place], [place] T, NP   2 months ago ADD (attention deficit disorder) without hyperactivity   [place] [facility] [place], [place] T, NP   3 months ago ADD (attention deficit disorder) without hyperactivity   [place] [facility] [place], Corrie Dandy T, NP   8 months ago ADD (attention deficit disorder) without hyperactivity   [place] [facility] [place], Dorie Rank, NP       Future Appointments             In 1 month Cannady, Dorie Rank, NP [facility]

## 2023-01-22 ENCOUNTER — Other Ambulatory Visit (HOSPITAL_COMMUNITY): Payer: Self-pay

## 2023-01-25 NOTE — Patient Instructions (Signed)
Be Involved in Caring For Your Health:  Taking Medications When medications are taken as directed, they can greatly improve your health. But if they are not taken as prescribed, they may not work. In some cases, not taking them correctly can be harmful. To help ensure your treatment remains effective and safe, understand your medications and how to take them. Bring your medications to each visit for review by your provider.  Your lab results, notes, and after visit summary will be available on My Chart. We strongly encourage you to use this feature. If lab results are abnormal the clinic will contact you with the appropriate steps. If the clinic does not contact you assume the results are satisfactory. You can always view your results on My Chart. If you have questions regarding your health or results, please contact the clinic during office hours. You can also ask questions on My Chart.  We at [facility] are grateful that you chose [place] to provide your care. We strive to provide evidence-based and compassionate care and are always looking for feedback. If you get a survey from the clinic please complete this so we can hear your opinions.  Living With Attention Deficit Hyperactivity Disorder If you have been diagnosed with attention deficit hyperactivity disorder (ADHD), you may be relieved that you now know why you have felt or behaved a certain way. Still, you may feel overwhelmed about the treatment ahead. You may also wonder how to get the support you need and how to deal with the condition day-to-day. With treatment and support, you can live with ADHD and manage your symptoms. How to manage lifestyle changes Managing lifestyle changes can be challenging. Seeking support from your healthcare provider, therapist, family, and friends can be helpful. How to recognize changes in your condition The following signs may mean that your treatment is working well and your condition is  improving: Consistently being on time for appointments. Being more organized at home and work. Other people noticing improvements in your behavior. Achieving goals that you set for yourself. Thinking more clearly. The following signs may mean that your treatment is not working very well: Feeling impatience or more confusion. Missing, forgetting, or being late for appointments. An increasing sense of disorganization and messiness. More difficulty in reaching goals that you set for yourself. Loved ones becoming angry or frustrated with you. Follow these instructions at home: Medicines Take over-the-counter and prescription medicines only as told by your health care provider. Check with your health care provider before taking any new medicines. General instructions Create structure and an organized atmosphere at home. For example: Make a list of tasks, then rank them from most important to least important. Work on one task at a time until your listed tasks are done. Make a daily schedule and follow it consistently every day. Use an appointment calendar, and check it 2-3 times a day to keep on track. Keep it with you when you leave the house. Create spaces where you keep certain things, and always put things back in their places after you use them. Keep all follow-up visits. Your health care provider will need to monitor your condition and adjust your treatment over time. Where to find support Talking to others  Keep emotion out of important discussions and speak in a calm, logical way. Listen closely and patiently to your loved ones. Try to understand their point of view, and try to avoid getting defensive. Take responsibility for the consequences of your actions. Ask that others  do not take your behaviors personally. Aim to solve problems as they come up, and express your feelings instead of bottling them up. Talk openly about what you need from your loved ones and how they can support  you. Consider going to family therapy sessions or having your family meet with a specialist who deals with ADHD-related behavior problems. Finances Not all insurance plans cover mental health care, so it is important to check with your insurance carrier. If paying for co-pays or counseling services is a problem, search for a local or county mental health care center. Public mental health care services may be offered there at a low cost or no cost when you are not able to see a private health care provider. If you are taking medicine for ADHD, you may be able to get the generic form, which may be less expensive than brand-name medicine. Some makers of prescription medicines also offer help to patients who cannot afford the medicines that they need. Therapy and support groups Talking with a mental health care provider and participating in support groups can help to improve your quality of life, daily functioning, and overall symptoms. Questions to ask your health care provider: What are the risks and benefits of taking medicines? Would I benefit from therapy? How often should I follow up with a health care provider? Where to find more information Learn more about ADHD from: Children and Adults with Attention Deficit Hyperactivity Disorder: chadd[employer]: [email or web address].com Centers for Disease Control and Prevention: TonerPromos.no Contact a health care provider if: You have side effects from your medicines, such as: Repeated muscle twitches, coughing, or speech outbursts. Sleep problems. Loss of appetite. Dizziness. Unusually fast heartbeat. Stomach pains. Headaches. You have new or worsening behavior problems. You are struggling with anxiety, depression, or substance abuse. Get help right away if: You have a severe reaction to a medicine. These symptoms may be an emergency. Get help right away. Call 911. Do not wait to see if the symptoms will go away. Do not drive  yourself to the hospital. Take one of these steps if you feel like you may hurt yourself or others, or have thoughts about taking your own life: Go to your nearest emergency room. Call 911. Call the National Suicide Prevention Lifeline at 407 159 0130 or 988. This is open 24 hours a day. Text the Crisis Text Line at (901)436-2918. Summary With treatment and support, you can live with ADHD and manage your symptoms. Consider taking part in family therapy or self-help groups with family members or friends. When you talk with friends and family about your ADHD, be patient and communicate openly. Keep all follow-up visits. Your health care provider will need to monitor your condition and adjust your treatment over time. This information is not intended to replace advice given to you by your health care provider. Make sure you discuss any questions you have with your health care provider. Document Revised: 06/02/2021 Document Reviewed: 06/02/2021 Elsevier Patient Education  2024 [employer].

## 2023-01-28 ENCOUNTER — Other Ambulatory Visit (HOSPITAL_COMMUNITY): Payer: Self-pay

## 2023-01-28 ENCOUNTER — Encounter: Payer: Self-pay | Admitting: Nurse Practitioner

## 2023-01-28 ENCOUNTER — Telehealth (INDEPENDENT_AMBULATORY_CARE_PROVIDER_SITE_OTHER): Payer: 59 | Admitting: Nurse Practitioner

## 2023-01-28 DIAGNOSIS — F988 Other specified behavioral and emotional disorders with onset usually occurring in childhood and adolescence: Secondary | ICD-10-CM

## 2023-01-28 DIAGNOSIS — F419 Anxiety disorder, unspecified: Secondary | ICD-10-CM | POA: Diagnosis not present

## 2023-01-28 DIAGNOSIS — F32A Depression, unspecified: Secondary | ICD-10-CM | POA: Diagnosis not present

## 2023-01-28 MED ORDER — AMPHETAMINE-DEXTROAMPHET ER 25 MG PO CP24
25.0000 mg | ORAL_CAPSULE | Freq: Every morning | ORAL | 0 refills | Status: DC
  Filled 2023-04-02: qty 30, 30d supply, fill #0

## 2023-01-28 MED ORDER — AMPHETAMINE-DEXTROAMPHET ER 25 MG PO CP24
25.0000 mg | ORAL_CAPSULE | Freq: Every morning | ORAL | 0 refills | Status: DC
  Filled 2023-03-01: qty 30, 30d supply, fill #0

## 2023-01-28 MED ORDER — BUPROPION HCL ER (XL) 150 MG PO TB24
ORAL_TABLET | ORAL | 3 refills | Status: DC
  Filled 2023-01-28 – 2023-05-03 (×2): qty 90, 90d supply, fill #0
  Filled 2023-07-12 – 2023-08-14 (×2): qty 90, 90d supply, fill #1

## 2023-01-28 MED ORDER — VENLAFAXINE HCL ER 150 MG PO CP24
150.0000 mg | ORAL_CAPSULE | Freq: Every day | ORAL | 4 refills | Status: DC
  Filled 2023-01-28 – 2023-04-02 (×2): qty 90, 90d supply, fill #0
  Filled 2023-07-12: qty 90, 90d supply, fill #1
  Filled 2023-10-18: qty 90, 90d supply, fill #2

## 2023-01-28 MED ORDER — AMPHETAMINE-DEXTROAMPHET ER 25 MG PO CP24
25.0000 mg | ORAL_CAPSULE | ORAL | 0 refills | Status: DC
  Filled 2023-01-28: qty 30, 30d supply, fill #0

## 2023-01-28 NOTE — Assessment & Plan Note (Signed)
Chronic, ongoing.  UDS due next 10/26/23, and controlled subs agreement up to date.  Refills sent in today.  Agrees to every 3 month visits, can be virtual if needed.  Her goal in long run is to transition to Effexor only if possible and come off Adderall, discussed this at length with her and recommend no cold [place] stopping but can work on gradual reduction over time with addition of Effexor.

## 2023-01-28 NOTE — Progress Notes (Signed)
Called to schedule 3 month appt. Left message on machine

## 2023-01-28 NOTE — Progress Notes (Signed)
LMP 01/05/2023 (Exact Date)    Subjective:    Patient ID: Shirley Rollins, female    DOB: [birth year], 42 y.o.   MRN: [id number]  HPI: Shirley Rollins is a 42 y.o. female  Chief Complaint  Patient presents with   ADHD   Virtual Visit via Video Note  I connected with Shirley Rollins on 01/28/23 at 10:40 AM EST by a video enabled telemedicine application and verified that I am speaking with the correct person using two identifiers.  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 The patient expressed understanding and agreed to proceed.  I discussed the assessment and treatment plan with the patient. The patient was provided an opportunity to ask questions and all were answered. The patient agreed with the plan and demonstrated an understanding of the instructions.   The patient was advised to call back or seek an in-person evaluation if the symptoms worsen or if the condition fails to improve as anticipated.  I provided 21 minutes of non-face-to-face time during this encounter.   Marjie Skiff, NP   ADD & DEPRESSION Follow-up for mood.  Continues on Adderall XR 25 MG daily, Effexor XR 150 MG daily, and Wellbutrin XL 150 MG daily.  She been on Adderall and Effexor for > 9 years and overall works well.  Last fill on PDMP review 12/26/22.  Has been feeling a lot better with Wellbutrin on board. Status: stable Satisfied with current therapy: yes Medication compliance:  good compliance Controlled substance contract: yes Previous psychiatry evaluation: no Previous medications: yes adderall XR   Taking meds on weekends/vacations: yes Work/school performance:  good Difficulty sustaining attention/completing tasks: no Distracted by extraneous stimuli: no Does not listen when spoken to: no  Fidgets with hands or feet: no Unable to stay in seat: no Blurts  out/interrupts others: no ADHD Medication Side Effects: no    Decreased appetite: no    Headache: no    Sleeping disturbance pattern: no    Irritability: no    Rebound effects (worse than baseline) off medication: no    Anxiousness: no    Dizziness: no    Tics: no    11/26/2022   10:59 AM 10/26/2022    9:10 AM 09/24/2022    8:57 AM 04/02/2022    8:42 AM 10/25/2021    4:06 PM  Depression screen PHQ 2/9  Decreased Interest 0 1 3 3 1   Down, Depressed, Hopeless 1 2 1 2 2   PHQ - 2 Score 1 3 4 5 3   Altered sleeping 0 2 1 3 1   Tired, decreased energy 2 3 1 3 3   Change in appetite 0 0 0 0 0  Feeling bad or failure about yourself  0 1 0 3 1  Trouble concentrating 2 2 1 3 2   Moving slowly or fidgety/restless 0 0 0 0 0  Suicidal thoughts 0 0 0 0 0  PHQ-9 Score 5 11 7 17 10   Difficult doing work/chores Not difficult at all Somewhat difficult Somewhat difficult Somewhat difficult Somewhat difficult       11/26/2022   11:00 AM 10/26/2022    9:10 AM 09/24/2022    8:59 AM 04/02/2022    8:43 AM  GAD 7 : Generalized Anxiety Score  Nervous, Anxious, on Edge 2 2 2 3   Control/stop worrying 2 1 1 3   Worry too much - different things 2  3 0 3  Trouble relaxing 1 1 1 3   Restless 1 2 0 3  Easily annoyed or irritable 2 3 1 3   Afraid - awful might happen 0 0 1 3  Total GAD 7 Score 10 12 6 21   Anxiety Difficulty Not difficult at all Very difficult Somewhat difficult Somewhat difficult   Relevant past medical, surgical, family and social history reviewed and updated as indicated. Interim medical history since our last visit reviewed. Allergies and medications reviewed and updated.  Review of Systems  Constitutional:  Negative for activity change, appetite change, diaphoresis, fatigue and fever.  Respiratory:  Negative for cough, chest tightness and shortness of breath.   Cardiovascular:  Negative for chest pain, palpitations and leg swelling.  Gastrointestinal: Negative.   Psychiatric/Behavioral:   Positive for decreased concentration (occasional). Negative for self-injury, sleep disturbance and suicidal ideas. The patient is not nervous/anxious.    Per HPI unless specifically indicated above     Objective:    LMP 01/05/2023 (Exact Date)   Wt Readings from Last 3 Encounters:  12/19/22 136 lb 14.5 oz (62.1 kg)  11/07/22 136 lb 12.8 oz (62.1 kg)  10/26/22 132 lb 6.4 oz (60.1 kg)    Physical Exam Vitals and nursing note reviewed.  Constitutional:      General: She is awake. She is not in acute distress.    Appearance: She is well-developed. She is not ill-appearing.  HENT:     Head: Normocephalic.     Right Ear: Hearing normal.     Left Ear: Hearing normal.  Eyes:     General: Lids are normal.        Right eye: No discharge.        Left eye: No discharge.     Conjunctiva/sclera: Conjunctivae normal.  Pulmonary:     Effort: Pulmonary effort is normal. No accessory muscle usage or respiratory distress.  Musculoskeletal:     Cervical back: Normal range of motion.  Neurological:     Mental Status: She is alert and oriented to person, place, and time.  Psychiatric:        Attention and Perception: Attention normal.        Mood and Affect: Mood normal.        Behavior: Behavior normal. Behavior is cooperative.        Thought Content: Thought content normal.        Judgment: Judgment normal.    Results for orders placed or performed in visit on 11/07/22  Alpha-Gal Panel  Result Value Ref Range   Class Description Allergens Comment    IgE (Immunoglobulin E), Serum 14 6 - 495 IU/mL   Pork IgE <0.10 Class 0 kU/L   Beef IgE <0.10 Class 0 kU/L   Allergen Lamb IgE <0.10 Class 0 kU/L   [id number] Alpha-Gal <0.10 Class 0 kU/L  Allergens(14)  Result Value Ref Range   Milk IgE <0.10 Class 0 kU/L   Codfish IgE <0.10 Class 0 kU/L   Wheat IgE <0.10 Class 0 kU/L   Allergen Corn, IgE <0.10 Class 0 kU/L   Peanut IgE <0.10 Class 0 kU/L   Soybean IgE <0.10 Class 0 kU/L   Shrimp IgE  <0.10 Class 0 kU/L   [id number] Mussel <0.10 Class 0 kU/L   Tuna <0.10 Class 0 kU/L   Allergen Salmon IgE <0.10 Class 0 kU/L   Chocolate/Cacao IgE <0.10 Class 0 kU/L   Egg, Whole IgE <0.10 Class 0 kU/L      Assessment &  Plan:   Problem List Items Addressed This Visit       Other   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  UDS due next 10/26/23, and controlled subs agreement up to date.  Refills sent in today.  Agrees to every 3 month visits, can be virtual if needed.  Her goal in long run is to transition to Effexor only if possible and come off Adderall, discussed this at length with her and recommend no cold [place] stopping but can work on gradual reduction over time with addition of Effexor.      Relevant Medications   amphetamine-dextroamphetamine (ADDERALL XR) 25 MG 24 hr capsule (Start on 03/28/2023)   Anxiety and depression    Chronic, improved mood with Wellbutrin.  Scores lowering last check and she reports improved mood.  Denies SI/HI.  Continue Effexor XR 150 MG daily at this time, Wellbutrin XL 150 MG daily + Vistaril as needed only for panic.  She is aware if needed she can increase Wellbutrin to 300 MG and alert provider if changed.  Educated her on medication and side effects + BLACK BOX warning.  Discussed adding on therapy or visit with psychiatry if worsening mood.        Relevant Medications   buPROPion (WELLBUTRIN XL) 150 MG 24 hr tablet   venlafaxine XR (EFFEXOR-XR) 150 MG 24 hr capsule    Follow up plan: Return in about 3 months (around 04/28/2023) for ADHD and Depression/anxiety virtual.

## 2023-01-28 NOTE — Assessment & Plan Note (Signed)
Chronic, improved mood with Wellbutrin.  Scores lowering last check and she reports improved mood.  Denies SI/HI.  Continue Effexor XR 150 MG daily at this time, Wellbutrin XL 150 MG daily + Vistaril as needed only for panic.  She is aware if needed she can increase Wellbutrin to 300 MG and alert provider if changed.  Educated her on medication and side effects + BLACK BOX warning.  Discussed adding on therapy or visit with psychiatry if worsening mood.

## 2023-01-29 ENCOUNTER — Other Ambulatory Visit (HOSPITAL_COMMUNITY): Payer: Self-pay

## 2023-03-01 ENCOUNTER — Other Ambulatory Visit (HOSPITAL_COMMUNITY): Payer: Self-pay

## 2023-04-02 ENCOUNTER — Other Ambulatory Visit (HOSPITAL_COMMUNITY): Payer: Self-pay

## 2023-04-02 ENCOUNTER — Other Ambulatory Visit: Payer: Self-pay

## 2023-04-22 DIAGNOSIS — M25531 Pain in right wrist: Secondary | ICD-10-CM | POA: Diagnosis not present

## 2023-04-24 DIAGNOSIS — M25531 Pain in right wrist: Secondary | ICD-10-CM | POA: Diagnosis not present

## 2023-05-03 ENCOUNTER — Other Ambulatory Visit (HOSPITAL_COMMUNITY): Payer: Self-pay

## 2023-05-04 ENCOUNTER — Other Ambulatory Visit (HOSPITAL_COMMUNITY): Payer: Self-pay

## 2023-05-07 DIAGNOSIS — M25531 Pain in right wrist: Secondary | ICD-10-CM | POA: Diagnosis not present

## 2023-05-08 ENCOUNTER — Other Ambulatory Visit: Payer: Self-pay | Admitting: Nurse Practitioner

## 2023-05-08 ENCOUNTER — Other Ambulatory Visit (HOSPITAL_COMMUNITY): Payer: Self-pay

## 2023-05-08 DIAGNOSIS — F988 Other specified behavioral and emotional disorders with onset usually occurring in childhood and adolescence: Secondary | ICD-10-CM

## 2023-05-08 MED ORDER — AMPHETAMINE-DEXTROAMPHET ER 25 MG PO CP24
25.0000 mg | ORAL_CAPSULE | Freq: Every morning | ORAL | 0 refills | Status: DC
  Filled 2023-05-08: qty 30, 30d supply, fill #0

## 2023-05-09 ENCOUNTER — Other Ambulatory Visit (HOSPITAL_COMMUNITY): Payer: Self-pay

## 2023-05-10 ENCOUNTER — Other Ambulatory Visit (HOSPITAL_COMMUNITY): Payer: Self-pay

## 2023-06-08 NOTE — Patient Instructions (Signed)
 Living With Attention Deficit Hyperactivity Disorder If you have been diagnosed with attention deficit hyperactivity disorder (ADHD), you may be relieved that you now know why you have felt or behaved a certain way. Still, you may feel overwhelmed about the treatment ahead. You may also wonder how to get the support you need and how to deal with the condition day-to-day. With treatment and support, you can live with ADHD and manage your symptoms. How to manage lifestyle changes Managing lifestyle changes can be challenging. Seeking support from your healthcare provider, therapist, family, and friends can be helpful. How to recognize changes in your condition The following signs may mean that your treatment is working well and your condition is improving: Consistently being on time for appointments. Being more organized at home and work. Other people noticing improvements in your behavior. Achieving goals that you set for yourself. Thinking more clearly. The following signs may mean that your treatment is not working very well: Feeling impatience or more confusion. Missing, forgetting, or being late for appointments. An increasing sense of disorganization and messiness. More difficulty in reaching goals that you set for yourself. Loved ones becoming angry or frustrated with you. Follow these instructions at home: Medicines Take over-the-counter and prescription medicines only as told by your health care provider. Check with your health care provider before taking any new medicines. General instructions Create structure and an organized atmosphere at home. For example: Make a list of tasks, then rank them from most important to least important. Work on one task at a time until your listed tasks are done. Make a daily schedule and follow it consistently every day. Use an appointment calendar, and check it 2-3 times a day to keep on track. Keep it with you when you leave the house. Create  spaces where you keep certain things, and always put things back in their places after you use them. Keep all follow-up visits. Your health care provider will need to monitor your condition and adjust your treatment over time. Where to find support Talking to others  Keep emotion out of important discussions and speak in a calm, logical way. Listen closely and patiently to your loved ones. Try to understand their point of view, and try to avoid getting defensive. Take responsibility for the consequences of your actions. Ask that others do not take your behaviors personally. Aim to solve problems as they come up, and express your feelings instead of bottling them up. Talk openly about what you need from your loved ones and how they can support you. Consider going to family therapy sessions or having your family meet with a specialist who deals with ADHD-related behavior problems. Finances Not all insurance plans cover mental health care, so it is important to check with your insurance carrier. If paying for co-pays or counseling services is a problem, search for a local or county mental health care center. Public mental health care services may be offered there at a low cost or no cost when you are not able to see a private health care provider. If you are taking medicine for ADHD, you may be able to get the generic form, which may be less expensive than brand-name medicine. Some makers of prescription medicines also offer help to patients who cannot afford the medicines that they need. Therapy and support groups Talking with a mental health care provider and participating in support groups can help to improve your quality of life, daily functioning, and overall symptoms. Questions to ask your health  care provider: What are the risks and benefits of taking medicines? Would I benefit from therapy? How often should I follow up with a health care provider? Where to find more information Learn more  about ADHD from: Children and Adults with Attention Deficit Hyperactivity Disorder: chadd[employer]: BloggerCourse.com Centers for Disease Control and Prevention: TonerPromos.no Contact a health care provider if: You have side effects from your medicines, such as: Repeated muscle twitches, coughing, or speech outbursts. Sleep problems. Loss of appetite. Dizziness. Unusually fast heartbeat. Stomach pains. Headaches. You have new or worsening behavior problems. You are struggling with anxiety, depression, or substance abuse. Get help right away if: You have a severe reaction to a medicine. These symptoms may be an emergency. Get help right away. Call 911. Do not wait to see if the symptoms will go away. Do not drive yourself to the hospital. Take one of these steps if you feel like you may hurt yourself or others, or have thoughts about taking your own life: Go to your nearest emergency room. Call 911. Call the National Suicide Prevention Lifeline at (989) 115-0802 or 988. This is open 24 hours a day. Text the Crisis Text Line at 802-008-3988. Summary With treatment and support, you can live with ADHD and manage your symptoms. Consider taking part in family therapy or self-help groups with family members or friends. When you talk with friends and family about your ADHD, be patient and communicate openly. Keep all follow-up visits. Your health care provider will need to monitor your condition and adjust your treatment over time. This information is not intended to replace advice given to you by your health care provider. Make sure you discuss any questions you have with your health care provider. Document Revised: 06/02/2021 Document Reviewed: 06/02/2021 Elsevier Patient Education  2024 [employer].

## 2023-06-10 ENCOUNTER — Encounter: Payer: Self-pay | Admitting: Nurse Practitioner

## 2023-06-10 ENCOUNTER — Telehealth (INDEPENDENT_AMBULATORY_CARE_PROVIDER_SITE_OTHER): Admitting: Nurse Practitioner

## 2023-06-10 ENCOUNTER — Other Ambulatory Visit (HOSPITAL_COMMUNITY): Payer: Self-pay

## 2023-06-10 DIAGNOSIS — F988 Other specified behavioral and emotional disorders with onset usually occurring in childhood and adolescence: Secondary | ICD-10-CM | POA: Diagnosis not present

## 2023-06-10 DIAGNOSIS — F419 Anxiety disorder, unspecified: Secondary | ICD-10-CM

## 2023-06-10 DIAGNOSIS — F32A Depression, unspecified: Secondary | ICD-10-CM

## 2023-06-10 MED ORDER — AMPHETAMINE-DEXTROAMPHET ER 25 MG PO CP24
25.0000 mg | ORAL_CAPSULE | Freq: Every morning | ORAL | 0 refills | Status: DC
  Filled 2023-06-10: qty 30, 30d supply, fill #0

## 2023-06-10 MED ORDER — AMPHETAMINE-DEXTROAMPHET ER 25 MG PO CP24
25.0000 mg | ORAL_CAPSULE | Freq: Every morning | ORAL | 0 refills | Status: DC
  Filled 2023-07-12: qty 30, 30d supply, fill #0

## 2023-06-10 MED ORDER — AMPHETAMINE-DEXTROAMPHET ER 25 MG PO CP24
25.0000 mg | ORAL_CAPSULE | ORAL | 0 refills | Status: DC
  Filled 2023-08-14: qty 30, 30d supply, fill #0

## 2023-06-10 NOTE — Progress Notes (Signed)
 There were no vitals taken for this visit.   Subjective:    Patient ID: Shirley Rollins, female    DOB: 15-Sep-1980, 43 y.o.   MRN: [id number]  HPI: Shirley Rollins is a 43 y.o. female  Chief Complaint  Patient presents with   ADHD   Anxiety   Depression   Virtual Visit via Video Note  I connected with ISADORE BOKHARI on 06/10/23 at  4:20 PM EDT by a video enabled telemedicine application and verified that I am speaking with the correct person using two identifiers.  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 The patient expressed understanding and agreed to proceed.  I discussed the assessment and treatment plan with the patient. The patient was provided an opportunity to ask questions and all were answered. The patient agreed with the plan and demonstrated an understanding of the instructions.   The patient was advised to call back or seek an in-person evaluation if the symptoms worsen or if the condition fails to improve as anticipated.  I provided 25 minutes of non-face-to-face time during this encounter.   Marjie Skiff, NP   ADD & DEPRESSION Taking Adderall XR 25 MG daily, Effexor XR 150 MG daily, and Wellbutrin XL 150 MG daily.  She been on Adderall and Effexor for > 10 years and overall works well. PDMP review 05/09/23.  Reports mood is improving with winter season leaving. Status: stable Satisfied with current therapy: yes Medication compliance:  good compliance Controlled substance contract: yes Previous psychiatry evaluation: no Previous medications: yes Adderall XR   Taking meds on weekends/vacations: yes Work/school performance:  good Difficulty sustaining attention/completing tasks: no Distracted by extraneous stimuli: occasional Does not listen when spoken to: no  Fidgets with hands or feet: a little bit, baseline Unable to stay  in seat: baseline Blurts out/interrupts others: no ADHD Medication Side Effects: no    Decreased appetite: no    Headache: no    Sleeping disturbance pattern: no    Irritability: no    Rebound effects (worse than baseline) off medication: no    Anxiousness: no    Dizziness: no    Tics: no    06/10/2023    4:04 PM 11/26/2022   10:59 AM 10/26/2022    9:10 AM 09/24/2022    8:57 AM 04/02/2022    8:42 AM  Depression screen PHQ 2/9  Decreased Interest 0 0 1 3 3   Down, Depressed, Hopeless 2 1 2 1 2   PHQ - 2 Score 2 1 3 4 5   Altered sleeping 0 0 2 1 3   Tired, decreased energy 3 2 3 1 3   Change in appetite 0 0 0 0 0  Feeling bad or failure about yourself  1 0 1 0 3  Trouble concentrating 0 2 2 1 3   Moving slowly or fidgety/restless 0 0 0 0 0  Suicidal thoughts 0 0 0 0 0  PHQ-9 Score 6 5 11 7 17   Difficult doing work/chores Somewhat difficult Not difficult at all Somewhat difficult Somewhat difficult Somewhat difficult       06/10/2023    4:05 PM 11/26/2022   11:00 AM 10/26/2022    9:10 AM 09/24/2022    8:59 AM  GAD 7 : Generalized Anxiety Score  Nervous, Anxious, on Edge 2 2 2 2   Control/stop worrying 1 2 1 1   Worry too much - different  things 1 2 3  0  Trouble relaxing 0 1 1 1   Restless 0 1 2 0  Easily annoyed or irritable 1 2 3 1   Afraid - awful might happen 1 0 0 1  Total GAD 7 Score 6 10 12 6   Anxiety Difficulty Somewhat difficult Not difficult at all Very difficult Somewhat difficult   Relevant past medical, surgical, family and social history reviewed and updated as indicated. Interim medical history since our last visit reviewed. Allergies and medications reviewed and updated.  Review of Systems  Constitutional:  Negative for activity change, appetite change, diaphoresis, fatigue and fever.  Respiratory:  Negative for cough, chest tightness and shortness of breath.   Cardiovascular:  Negative for chest pain, palpitations and leg swelling.  Gastrointestinal: Negative.    Psychiatric/Behavioral:  Positive for decreased concentration (occasional). Negative for self-injury, sleep disturbance and suicidal ideas. The patient is not nervous/anxious.    Per HPI unless specifically indicated above     Objective:    There were no vitals taken for this visit.  Wt Readings from Last 3 Encounters:  12/19/22 136 lb 14.5 oz (62.1 kg)  11/07/22 136 lb 12.8 oz (62.1 kg)  10/26/22 132 lb 6.4 oz (60.1 kg)    Physical Exam Vitals and nursing note reviewed.  Constitutional:      General: She is awake. She is not in acute distress.    Appearance: She is well-developed. She is not ill-appearing.  HENT:     Head: Normocephalic.     Right Ear: Hearing normal.     Left Ear: Hearing normal.  Eyes:     General: Lids are normal.        Right eye: No discharge.        Left eye: No discharge.     Conjunctiva/sclera: Conjunctivae normal.  Pulmonary:     Effort: Pulmonary effort is normal. No accessory muscle usage or respiratory distress.  Musculoskeletal:     Cervical back: Normal range of motion.  Neurological:     Mental Status: She is alert and oriented to person, place, and time.  Psychiatric:        Attention and Perception: Attention normal.        Mood and Affect: Mood normal.        Behavior: Behavior normal. Behavior is cooperative.        Thought Content: Thought content normal.        Judgment: Judgment normal.    Results for orders placed or performed in visit on 11/07/22  Alpha-Gal Panel   Collection Time: 11/07/22  4:38 PM  Result Value Ref Range   Class Description Allergens Comment    IgE (Immunoglobulin E), Serum 14 6 - 495 IU/mL   Pork IgE <0.10 Class 0 kU/L   Beef IgE <0.10 Class 0 kU/L   Allergen Lamb IgE <0.10 Class 0 kU/L   O215-IgE Alpha-Gal <0.10 Class 0 kU/L  Allergens(14)   Collection Time: 11/07/22  4:38 PM  Result Value Ref Range   Milk IgE <0.10 Class 0 kU/L   Codfish IgE <0.10 Class 0 kU/L   Wheat IgE <0.10 Class 0 kU/L    Allergen Corn, IgE <0.10 Class 0 kU/L   Peanut IgE <0.10 Class 0 kU/L   Soybean IgE <0.10 Class 0 kU/L   Shrimp IgE <0.10 Class 0 kU/L   F037-IgE Mussel <0.10 Class 0 kU/L   Tuna <0.10 Class 0 kU/L   Allergen Salmon IgE <0.10 Class 0 kU/L  Chocolate/Cacao IgE <0.10 Class 0 kU/L   Egg, Whole IgE <0.10 Class 0 kU/L      Assessment & Plan:   Problem List Items Addressed This Visit       Other   ADD (attention deficit disorder) without hyperactivity   Chronic, ongoing.  UDS due next 10/26/23. Controlled subs agreement up to date.  Refills sent in today.  Agrees to every 3 month visits, can be virtual if needed.  Her goal in long run is to transition to Effexor only if possible and come off Adderall, discussed this at length with her and recommend no cold [place] stopping but can work on gradual reduction over time with addition of Effexor.      Relevant Medications   amphetamine-dextroamphetamine (ADDERALL XR) 25 MG 24 hr capsule (Start on 07/10/2023)   Anxiety and depression - Primary   Chronic, stable.  Denies SI/HI.  Continue Effexor XR 150 MG daily at this time, Wellbutrin XL 150 MG daily + Vistaril as needed only for panic.  She is aware if needed she can increase Wellbutrin to 300 MG and alert provider if changed.  Educated her on medication and side effects + BLACK BOX warning.  Discussed adding on therapy or visit with psychiatry if worsening mood.          Follow up plan: Return in about 3 months (around 09/09/2023) for ADD, Depression, ANXIETY.

## 2023-06-10 NOTE — Assessment & Plan Note (Signed)
 Chronic, stable.  Denies SI/HI.  Continue Effexor XR 150 MG daily at this time, Wellbutrin XL 150 MG daily + Vistaril as needed only for panic.  She is aware if needed she can increase Wellbutrin to 300 MG and alert provider if changed.  Educated her on medication and side effects + BLACK BOX warning.  Discussed adding on therapy or visit with psychiatry if worsening mood.

## 2023-06-10 NOTE — Assessment & Plan Note (Signed)
 Chronic, ongoing.  UDS due next 10/26/23. Controlled subs agreement up to date.  Refills sent in today.  Agrees to every 3 month visits, can be virtual if needed.  Her goal in long run is to transition to Effexor only if possible and come off Adderall, discussed this at length with her and recommend no cold [place] stopping but can work on gradual reduction over time with addition of Effexor.

## 2023-06-13 NOTE — Progress Notes (Signed)
 Called patient to schedule left message on machine for her to call the office and schedule  3 month follow up appt

## 2023-07-12 ENCOUNTER — Other Ambulatory Visit (HOSPITAL_COMMUNITY): Payer: Self-pay

## 2023-07-12 ENCOUNTER — Other Ambulatory Visit: Payer: Self-pay

## 2023-07-31 ENCOUNTER — Encounter: Payer: Self-pay | Admitting: Nurse Practitioner

## 2023-07-31 ENCOUNTER — Ambulatory Visit (HOSPITAL_COMMUNITY)
Admission: RE | Admit: 2023-07-31 | Discharge: 2023-07-31 | Disposition: A | Discharge status: Home / Self Care | Source: Ambulatory Visit | Attending: Nurse Practitioner | Admitting: Nurse Practitioner

## 2023-07-31 ENCOUNTER — Telehealth (INDEPENDENT_AMBULATORY_CARE_PROVIDER_SITE_OTHER): Admitting: Nurse Practitioner

## 2023-07-31 VITALS — Temp 98.6°F | Ht 66.0 in | Wt 132.8 lb

## 2023-07-31 DIAGNOSIS — K5900 Constipation, unspecified: Secondary | ICD-10-CM | POA: Diagnosis not present

## 2023-07-31 DIAGNOSIS — Z1211 Encounter for screening for malignant neoplasm of colon: Secondary | ICD-10-CM

## 2023-07-31 NOTE — Patient Instructions (Signed)
 Home "Clean Out" and Daily Treatment of Constipation  Step 1: Clean Out  This allows stool softeners to work better after starting with an empty colon.  Choose a day and place where you can be near a bathroom for the entire day.  1. Ex-Lax (Senna)  Take this when you are ready to begin the clean out: 2 tablets  2. Miralax (or generic equivalent)  - Mix 510 grams (30 capfuls) Miralax with 2 quarts (64 ounces) Gatorade or other preferred clear liquid. Chill.  - drink over 4 hours.   Step 2: Maintenance treatment to follow cleanout  The goal is for you to have a soft, easy to pass stool every day to allow the colon to shrink back down to its normal size.  1. Miralax 1 capful (17 grams) in 8 oz liquid a day for 2 months. Then taper to 1/2 that dose for 1 month and then every other day for 2 weeks and discontinue if you are having regular soft bowel movements. You may need to take this every day to prevent constipation.  2. Encourage healthy eating. Make sure to eat plenty of whole grains, nuts, vegetables, and fruits (smoothies with yogurt and fruit are fine). Make sure you drink plenty of water every day.   3. Sit on the toilet for 5 minutes after breakfast and dinner for 5-10 minutes with legs elevated on a stool or feet on the floor; knees apart and bending forward so the chest touches the upper legs (This position opens up the rectum).  Please follow up with your primary care doctor in 1 week, to discuss this visit.  Return to the ED if you notice any of the following:  - Constipation or no bowel movement in over 2 days that does not respond to suppository  - Bloody or markedly abnormal smelling stool  - Increasing pain - Fever that does not respond to Tylenol  - Uncontrolled nausea/vomiting with inability to keep down liquids - Weakness, lightheadedness or passing out - Anything else that concerns you

## 2023-07-31 NOTE — Progress Notes (Signed)
 Temp 98.6 F (37 C)   Ht 5\' 6"  (1.676 m)   Wt 132 lb 12.8 oz (60.2 kg)   LMP 07/21/2023   BMI 21.43 kg/m    Subjective:    Patient ID: Shirley Rollins, female    DOB: 1981/02/08, 43 y.o.   MRN: [id number]  HPI: Shirley Rollins is a 43 y.o. female  Chief Complaint  Patient presents with   GI Problem    Constipation started about 2 weeks ago. OTC laxative, mag citrate and enema with no relief.     ABDOMINAL ISSUES Patient states she has been having constipation for about 2 weeks.  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  Drinks lots of water.  She took a laxative on Friday and didn't see relief.  She has had loss of appetite. Felt really full and then ended up getting sick due to grease.  She is able to drink fluids but not able to eat on Monday and Tuesday.  On Monday she did drink mag citrate and had two small watery bowel movements on Tuesday morning.  She did a fleet enema on Tuesday and had a small bowel after that.    Relevant past medical, surgical, family and social history reviewed and updated as indicated. Interim medical history since our last visit reviewed. Allergies and medications reviewed and updated.  Review of Systems  Constitutional:  Positive for appetite change.  Gastrointestinal:  Positive for constipation. Negative for diarrhea and nausea.    Per HPI unless specifically indicated above     Objective:     Temp 98.6 F (37 C)   Ht 5\' 6"  (1.676 m)   Wt 132 lb 12.8 oz (60.2 kg)   LMP 07/21/2023   BMI 21.43 kg/m   Wt Readings from Last 3 Encounters:  07/31/23 132 lb 12.8 oz (60.2 kg)  12/19/22 136 lb 14.5 oz (62.1 kg)  11/07/22 136 lb 12.8 oz (62.1 kg)    Physical Exam Vitals and nursing note reviewed.  HENT:     Head: Normocephalic.     Right Ear: Hearing normal.     Left Ear: Hearing normal.     Nose: Nose normal.  Eyes:     Pupils: Pupils are equal, round, and reactive to light.  Pulmonary:     Effort: Pulmonary effort is normal. No  respiratory distress.  Neurological:     Mental Status: She is alert.  Psychiatric:        Mood and Affect: Mood normal.        Behavior: Behavior normal.        Thought Content: Thought content normal.        Judgment: Judgment normal.     Results for orders placed or performed in visit on 11/07/22  Alpha-Gal Panel   Collection Time: 11/07/22  4:38 PM  Result Value Ref Range   Class Description Allergens Comment    IgE (Immunoglobulin E), Serum 14 6 - 495 IU/mL   Pork IgE <0.10 Class 0 kU/L   Beef IgE <0.10 Class 0 kU/L   Allergen Lamb IgE <0.10 Class 0 kU/L   O215-IgE Alpha-Gal <0.10 Class 0 kU/L  Allergens(14)   Collection Time: 11/07/22  4:38 PM  Result Value Ref Range   Milk IgE <0.10 Class 0 kU/L   Codfish IgE <0.10 Class 0 kU/L   Wheat IgE <0.10 Class 0 kU/L   Allergen Corn, IgE <0.10 Class 0 kU/L   Peanut IgE <0.10 Class 0 kU/L  Soybean IgE <0.10 Class 0 kU/L   Shrimp IgE <0.10 Class 0 kU/L   F037-IgE Mussel <0.10 Class 0 kU/L   Tuna <0.10 Class 0 kU/L   Allergen Salmon IgE <0.10 Class 0 kU/L   Chocolate/Cacao IgE <0.10 Class 0 kU/L   Egg, Whole IgE <0.10 Class 0 kU/L      Assessment & Plan:   Problem List Items Addressed This Visit   None Visit Diagnoses       Constipation, unspecified constipation type    -  Primary   Recommend bowel cleanout.  Instructions for proper use of miralax to help with this given in mychart.  Xray ordered for further evaluation.   Relevant Orders   DG Abd 2 Views     Screening for colon cancer       Relevant Orders   Ambulatory referral to Gastroenterology        Follow up plan: No follow-ups on file.  This visit was completed via MyChart due to the restrictions of the COVID-19 pandemic. All issues as above were discussed and addressed. Physical exam was done as above through visual confirmation on MyChart. If it was felt that the patient should be evaluated in the office, they were directed there. The patient verbally  consented to this visit. Location of the patient: Work Location of the provider: Office Those involved with this call:  Provider: Aileen Alexanders, NP CMA: Althia Jetty, CMA Front Desk/Registration: Jaynee Meyer This encounter was conducted via video.  I spent 20 mins dedicated to the care of this patient on the date of this encounter to include previsit review of reviewing symptoms, plan of care and follow up, face to face time with the patient, and post visit ordering of testing.

## 2023-08-05 ENCOUNTER — Encounter: Payer: Self-pay | Admitting: Nurse Practitioner

## 2023-08-05 ENCOUNTER — Ambulatory Visit: Payer: Self-pay | Admitting: Nurse Practitioner

## 2023-08-14 ENCOUNTER — Other Ambulatory Visit (HOSPITAL_COMMUNITY): Payer: Self-pay

## 2023-09-16 ENCOUNTER — Other Ambulatory Visit: Payer: Self-pay | Admitting: Nurse Practitioner

## 2023-09-16 ENCOUNTER — Other Ambulatory Visit (HOSPITAL_COMMUNITY): Payer: Self-pay

## 2023-09-18 ENCOUNTER — Other Ambulatory Visit: Payer: Self-pay | Admitting: Nurse Practitioner

## 2023-09-18 ENCOUNTER — Other Ambulatory Visit (HOSPITAL_COMMUNITY): Payer: Self-pay

## 2023-09-18 NOTE — Telephone Encounter (Signed)
 Requested medication (s) are due for refill today:   Provider to review  Requested medication (s) are on the active medication list:   Yes  Future visit scheduled:    No.   LOV 07/31/2023 video visit   Last ordered: 06/10/2023 #30, 0 refills  Non delegated refill    Requested Prescriptions  Pending Prescriptions Disp Refills   amphetamine -dextroamphetamine  (ADDERALL  XR) 25 MG 24 hr capsule 30 capsule 0    Sig: Take 1 capsule by mouth every morning.     Not Delegated - Psychiatry:  Stimulants/ADHD Failed - 09/18/2023  1:56 PM      Failed - This refill cannot be delegated      Passed - Urine Drug Screen completed in last 360 days      Passed - Last BP in normal range    BP Readings from Last 1 Encounters:  12/19/22 122/88         Passed - Last Heart Rate in normal range    Pulse Readings from Last 1 Encounters:  12/19/22 82         Passed - Valid encounter within last 6 months    Recent Outpatient Visits           1 month ago Constipation, unspecified constipation type   [facility] Melvin Pao, NP   3 months ago Anxiety and depression   [place] [facility] [place], Melanie DASEN, NP

## 2023-09-18 NOTE — Telephone Encounter (Signed)
 Copied from CRM [id number]. Topic: Clinical - Medication Refill >> Sep 18, 2023  1:59 PM Avram G wrote: Medication: amphetamine -dextroamphetamine  (ADDERALL  XR) 25 MG 24 hr capsule [[id number]]  Has the patient contacted their pharmacy? Yes (Agent: If no, request that the patient contact the pharmacy for the refill. If patient does not wish to contact the pharmacy document the reason why and proceed with request.) (Agent: If yes, when and what did the pharmacy advise?)  This is the patient's preferred pharmacy:  [facility] [address] [place] [address] Phone: [phone] Fax: [phone]  Is this the correct pharmacy for this prescription? Yes If no, delete pharmacy and type the correct one.   Has the prescription been filled recently? No  Is the patient out of the medication? Yes  Has the patient been seen for an appointment in the last year OR does the patient have an upcoming appointment? Yes  Can we respond through MyChart? Yes  Agent: Please be advised that Rx refills may take up to 3 business days. We ask that you follow-up with your pharmacy.

## 2023-09-19 ENCOUNTER — Other Ambulatory Visit (HOSPITAL_COMMUNITY): Payer: Self-pay

## 2023-09-19 MED ORDER — AMPHETAMINE-DEXTROAMPHET ER 25 MG PO CP24
25.0000 mg | ORAL_CAPSULE | Freq: Every morning | ORAL | 0 refills | Status: DC
  Filled 2023-09-19: qty 30, 30d supply, fill #0

## 2023-09-20 NOTE — Telephone Encounter (Signed)
 Needs appt for refill

## 2023-09-20 NOTE — Telephone Encounter (Signed)
 She is scheduled for 9/4 virtually, she wants to know if this is ok because she is already coming in on 9/16 for a Physical and she works in [place].

## 2023-09-20 NOTE — Telephone Encounter (Signed)
 Requested medication (s) are due for refill today: yes  Requested medication (s) are on the active medication list: yes  Last refill:  06/10/23  Future visit scheduled: no  Notes to clinic:  Unable to refill per protocol, cannot delegate.      Requested Prescriptions  Pending Prescriptions Disp Refills   amphetamine -dextroamphetamine  (ADDERALL  XR) 25 MG 24 hr capsule 30 capsule 0    Sig: Take 1 capsule by mouth every morning.     Not Delegated - Psychiatry:  Stimulants/ADHD Failed - 09/20/2023 10:33 AM      Failed - This refill cannot be delegated      Passed - Urine Drug Screen completed in last 360 days      Passed - Last BP in normal range    BP Readings from Last 1 Encounters:  12/19/22 122/88         Passed - Last Heart Rate in normal range    Pulse Readings from Last 1 Encounters:  12/19/22 82         Passed - Valid encounter within last 6 months    Recent Outpatient Visits           1 month ago Constipation, unspecified constipation type   [place] [facility] Melvin Pao, NP   3 months ago Anxiety and depression   [place] [facility] [place], Melanie DASEN, NP

## 2023-09-20 NOTE — Telephone Encounter (Signed)
 Patient is overdue for her 3 month f/up. Please call to schedule and then route to provider for refill.

## 2023-09-20 NOTE — Telephone Encounter (Signed)
 Will need to wait for Jolene to return to determine refill and appropriateness of appointments.

## 2023-09-23 NOTE — Telephone Encounter (Signed)
 Thank you, she will be seen 9/4 virtually and 9/16 for a physical.

## 2023-09-26 DIAGNOSIS — K5900 Constipation, unspecified: Secondary | ICD-10-CM | POA: Diagnosis not present

## 2023-10-18 ENCOUNTER — Other Ambulatory Visit: Payer: Self-pay | Admitting: Nurse Practitioner

## 2023-10-18 ENCOUNTER — Other Ambulatory Visit (HOSPITAL_COMMUNITY): Payer: Self-pay

## 2023-10-21 ENCOUNTER — Other Ambulatory Visit (HOSPITAL_COMMUNITY): Payer: Self-pay

## 2023-10-21 MED ORDER — AMPHETAMINE-DEXTROAMPHET ER 25 MG PO CP24
25.0000 mg | ORAL_CAPSULE | Freq: Every morning | ORAL | 0 refills | Status: DC
  Filled 2023-10-21: qty 30, 30d supply, fill #0

## 2023-10-21 NOTE — Telephone Encounter (Signed)
 Requested medication (s) are due for refill today - yes  Requested medication (s) are on the active medication list -yes  Future visit scheduled -ys  Last refill: 09/19/23 #30  Notes to clinic: non delegated Rx  Requested Prescriptions  Pending Prescriptions Disp Refills   amphetamine -dextroamphetamine  (ADDERALL  XR) 25 MG 24 hr capsule 30 capsule 0    Sig: Take 1 capsule by mouth every morning.     Not Delegated - Psychiatry:  Stimulants/ADHD Failed - 10/21/2023 11:23 AM      Failed - This refill cannot be delegated      Failed - Urine Drug Screen completed in last 360 days      Passed - Last BP in normal range    BP Readings from Last 1 Encounters:  12/19/22 122/88         Passed - Last Heart Rate in normal range    Pulse Readings from Last 1 Encounters:  12/19/22 82         Passed - Valid encounter within last 6 months    Recent Outpatient Visits           2 months ago Constipation, unspecified constipation type   [place] [facility] [place], Darice, NP   4 months ago Anxiety and depression   [place] [facility] [place], Melanie T, NP                 Requested Prescriptions  Pending Prescriptions Disp Refills   amphetamine -dextroamphetamine  (ADDERALL  XR) 25 MG 24 hr capsule 30 capsule 0    Sig: Take 1 capsule by mouth every morning.     Not Delegated - Psychiatry:  Stimulants/ADHD Failed - 10/21/2023 11:23 AM      Failed - This refill cannot be delegated      Failed - Urine Drug Screen completed in last 360 days      Passed - Last BP in normal range    BP Readings from Last 1 Encounters:  12/19/22 122/88         Passed - Last Heart Rate in normal range    Pulse Readings from Last 1 Encounters:  12/19/22 82         Passed - Valid encounter within last 6 months    Recent Outpatient Visits           2 months ago Constipation, unspecified constipation type   [facility] Melvin Darice,  NP   4 months ago Anxiety and depression    [facility] [place], Melanie DASEN, NP

## 2023-10-27 NOTE — Patient Instructions (Signed)
 Be Involved in Caring For Your Health:  Taking Medications When medications are taken as directed, they can greatly improve your health. But if they are not taken as prescribed, they may not work. In some cases, not taking them correctly can be harmful. To help ensure your treatment remains effective and safe, understand your medications and how to take them. Bring your medications to each visit for review by your provider.  Your lab results, notes, and after visit summary will be available on My Chart. We strongly encourage you to use this feature. If lab results are abnormal the clinic will contact you with the appropriate steps. If the clinic does not contact you assume the results are satisfactory. You can always view your results on My Chart. If you have questions regarding your health or results, please contact the clinic during office hours. You can also ask questions on My Chart.  We at [facility] are grateful that you chose [place] to provide your care. We strive to provide evidence-based and compassionate care and are always looking for feedback. If you get a survey from the clinic please complete this so we can hear your opinions.  Living With Attention Deficit Hyperactivity Disorder If you have been diagnosed with attention deficit hyperactivity disorder (ADHD), you may be relieved that you now know why you have felt or behaved a certain way. Still, you may feel overwhelmed about the treatment ahead. You may also wonder how to get the support you need and how to deal with the condition day-to-day. With treatment and support, you can live with ADHD and manage your symptoms. How to manage lifestyle changes Managing lifestyle changes can be challenging. Seeking support from your healthcare provider, therapist, family, and friends can be helpful. How to recognize changes in your condition The following signs may mean that your treatment is working well and your condition is  improving: Consistently being on time for appointments. Being more organized at home and work. Other people noticing improvements in your behavior. Achieving goals that you set for yourself. Thinking more clearly. The following signs may mean that your treatment is not working very well: Feeling impatience or more confusion. Missing, forgetting, or being late for appointments. An increasing sense of disorganization and messiness. More difficulty in reaching goals that you set for yourself. Loved ones becoming angry or frustrated with you. Follow these instructions at home: Medicines Take over-the-counter and prescription medicines only as told by your health care provider. Check with your health care provider before taking any new medicines. General instructions Create structure and an organized atmosphere at home. For example: Make a list of tasks, then rank them from most important to least important. Work on one task at a time until your listed tasks are done. Make a daily schedule and follow it consistently every day. Use an appointment calendar, and check it 2-3 times a day to keep on track. Keep it with you when you leave the house. Create spaces where you keep certain things, and always put things back in their places after you use them. Keep all follow-up visits. Your health care provider will need to monitor your condition and adjust your treatment over time. Where to find support Talking to others  Keep emotion out of important discussions and speak in a calm, logical way. Listen closely and patiently to your loved ones. Try to understand their point of view, and try to avoid getting defensive. Take responsibility for the consequences of your actions. Ask that others  do not take your behaviors personally. Aim to solve problems as they come up, and express your feelings instead of bottling them up. Talk openly about what you need from your loved ones and how they can support  you. Consider going to family therapy sessions or having your family meet with a specialist who deals with ADHD-related behavior problems. Finances Not all insurance plans cover mental health care, so it is important to check with your insurance carrier. If paying for co-pays or counseling services is a problem, search for a local or county mental health care center. Public mental health care services may be offered there at a low cost or no cost when you are not able to see a private health care provider. If you are taking medicine for ADHD, you may be able to get the generic form, which may be less expensive than brand-name medicine. Some makers of prescription medicines also offer help to patients who cannot afford the medicines that they need. Therapy and support groups Talking with a mental health care provider and participating in support groups can help to improve your quality of life, daily functioning, and overall symptoms. Questions to ask your health care provider: What are the risks and benefits of taking medicines? Would I benefit from therapy? How often should I follow up with a health care provider? Where to find more information Learn more about ADHD from: Children and Adults with Attention Deficit Hyperactivity Disorder: chadd[employer] of Mental Health: BloggerCourse.com Centers for Disease Control and Prevention: TonerPromos.no Contact a health care provider if: You have side effects from your medicines, such as: Repeated muscle twitches, coughing, or speech outbursts. Sleep problems. Loss of appetite. Dizziness. Unusually fast heartbeat. Stomach pains. Headaches. You have new or worsening behavior problems. You are struggling with anxiety, depression, or substance abuse. Get help right away if: You have a severe reaction to a medicine. These symptoms may be an emergency. Get help right away. Call 911. Do not wait to see if the symptoms will go away. Do not drive  yourself to the hospital. Take one of these steps if you feel like you may hurt yourself or others, or have thoughts about taking your own life: Go to your nearest emergency room. Call 911. Call the National Suicide Prevention Lifeline at 407 159 0130 or 988. This is open 24 hours a day. Text the Crisis Text Line at (901)436-2918. Summary With treatment and support, you can live with ADHD and manage your symptoms. Consider taking part in family therapy or self-help groups with family members or friends. When you talk with friends and family about your ADHD, be patient and communicate openly. Keep all follow-up visits. Your health care provider will need to monitor your condition and adjust your treatment over time. This information is not intended to replace advice given to you by your health care provider. Make sure you discuss any questions you have with your health care provider. Document Revised: 06/02/2021 Document Reviewed: 06/02/2021 Elsevier Patient Education  2024 [employer].

## 2023-10-30 ENCOUNTER — Other Ambulatory Visit (HOSPITAL_COMMUNITY): Payer: Self-pay

## 2023-10-30 MED ORDER — PEG 3350-KCL-NA BICARB-NACL 420 G PO SOLR
ORAL | 0 refills | Status: DC
  Filled 2023-10-30: qty 4000, 1d supply, fill #0

## 2023-10-30 MED ORDER — BISACODYL 5 MG PO TBEC
5.0000 mg | DELAYED_RELEASE_TABLET | Freq: Every day | ORAL | 0 refills | Status: DC
  Filled 2023-10-30: qty 4, 4d supply, fill #0

## 2023-10-31 ENCOUNTER — Encounter: Payer: Self-pay | Admitting: Nurse Practitioner

## 2023-10-31 ENCOUNTER — Other Ambulatory Visit: Payer: Self-pay

## 2023-10-31 ENCOUNTER — Other Ambulatory Visit (HOSPITAL_COMMUNITY): Payer: Self-pay

## 2023-10-31 ENCOUNTER — Telehealth (INDEPENDENT_AMBULATORY_CARE_PROVIDER_SITE_OTHER): Admitting: Nurse Practitioner

## 2023-10-31 DIAGNOSIS — F32A Depression, unspecified: Secondary | ICD-10-CM | POA: Diagnosis not present

## 2023-10-31 DIAGNOSIS — F988 Other specified behavioral and emotional disorders with onset usually occurring in childhood and adolescence: Secondary | ICD-10-CM | POA: Diagnosis not present

## 2023-10-31 DIAGNOSIS — F419 Anxiety disorder, unspecified: Secondary | ICD-10-CM

## 2023-10-31 MED ORDER — VENLAFAXINE HCL ER 150 MG PO CP24
150.0000 mg | ORAL_CAPSULE | Freq: Every day | ORAL | 4 refills | Status: AC
  Filled 2023-10-31 – 2024-01-28 (×3): qty 90, 90d supply, fill #0

## 2023-10-31 MED ORDER — AMPHETAMINE-DEXTROAMPHET ER 25 MG PO CP24
25.0000 mg | ORAL_CAPSULE | Freq: Every morning | ORAL | 0 refills | Status: DC
  Filled 2024-01-28: qty 30, 30d supply, fill #0

## 2023-10-31 MED ORDER — BUPROPION HCL ER (XL) 150 MG PO TB24
150.0000 mg | ORAL_TABLET | Freq: Every day | ORAL | 4 refills | Status: DC
  Filled 2023-10-31 – 2023-11-21 (×2): qty 90, 90d supply, fill #0

## 2023-10-31 MED ORDER — AMPHETAMINE-DEXTROAMPHET ER 25 MG PO CP24
25.0000 mg | ORAL_CAPSULE | Freq: Every morning | ORAL | 0 refills | Status: DC
  Filled 2023-11-21: qty 30, 30d supply, fill #0

## 2023-10-31 MED ORDER — AMPHETAMINE-DEXTROAMPHET ER 25 MG PO CP24
25.0000 mg | ORAL_CAPSULE | Freq: Every morning | ORAL | 0 refills | Status: DC
  Filled 2023-12-25: qty 14, 14d supply, fill #0
  Filled 2023-12-25: qty 16, 16d supply, fill #0

## 2023-10-31 NOTE — Assessment & Plan Note (Signed)
 Chronic, stable.  Denies SI/HI.  Continue Effexor XR 150 MG daily at this time, Wellbutrin XL 150 MG daily + Vistaril as needed only for panic.  She is aware if needed she can increase Wellbutrin to 300 MG and alert provider if changed.  Educated her on medication and side effects + BLACK BOX warning.  Discussed adding on therapy or visit with psychiatry if worsening mood.

## 2023-10-31 NOTE — Progress Notes (Signed)
 There were no vitals taken for this visit.   Subjective:    Patient ID: Shirley Rollins, female    DOB: [birth year], 43 y.o.   MRN: [id number]  HPI: Shirley Rollins is a 43 y.o. female  Chief Complaint  Patient presents with   ADHD   Virtual Visit via Video Note  I connected with Shirley Rollins on 10/31/23 at  9:40 AM EDT by a video enabled telemedicine application and verified that I am speaking with the correct person using two identifiers.  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 The patient expressed understanding and agreed to proceed.  I discussed the assessment and treatment plan with the patient. The patient was provided an opportunity to ask questions and all were answered. The patient agreed with the plan and demonstrated an understanding of the instructions.   The patient was advised to call back or seek an in-person evaluation if the symptoms worsen or if the condition fails to improve as anticipated.  I provided 25 minutes of non-face-to-face time during this encounter.   Mykelti Goldenstein T Quinnlan Abruzzo, NP   ADHD FOLLOW UP Taking Adderall  XR 25 MG daily, Effexor  XR 150 MG daily, and Wellbutrin  XL 150 MG daily.  She been on Adderall  and Effexor  for > 10 years and overall works well. PDMP review 05/09/23.  Reports mood is improving with winter season leaving.  ADHD status: stable Satisfied with current therapy: yes Medication compliance:  good compliance Controlled substance contract: yes Previous psychiatry evaluation: yes Previous medications: Adderall  Taking meds on weekends/vacations: yes Work/school performance:  average Difficulty sustaining attention/completing tasks: no Distracted by extraneous stimuli: situational Does not listen when spoken to: no  Fidgets with hands or feet: yes Unable to stay in seat: occasional Blurts out/interrupts  others: no ADHD Medication Side Effects: no    Decreased appetite: no    Headache: no    Sleeping disturbance pattern: no    Irritability: no    Rebound effects (worse than baseline) off medication: no    Anxiousness: no    Dizziness: no    Tics: no     10/31/2023   10:08 AM 07/31/2023    8:54 AM 06/10/2023    4:04 PM 11/26/2022   10:59 AM 10/26/2022    9:10 AM  Depression screen PHQ 2/9  Decreased Interest 1 1 0 0 1  Down, Depressed, Hopeless 2 0 2 1 2   PHQ - 2 Score 3 1 2 1 3   Altered sleeping 0  0 0 2  Tired, decreased energy 3  3 2 3   Change in appetite 2  0 0 0  Feeling bad or failure about yourself  0  1 0 1  Trouble concentrating 2  0 2 2  Moving slowly or fidgety/restless 0  0 0 0  Suicidal thoughts 0  0 0 0  PHQ-9 Score 10  6 5 11   Difficult doing work/chores Somewhat difficult  Somewhat difficult Not difficult at all Somewhat difficult       10/31/2023   10:10 AM 07/31/2023    8:54 AM 06/10/2023    4:05 PM 11/26/2022   11:00 AM  GAD 7 : Generalized Anxiety Score  Nervous, Anxious, on Edge 3 2 2 2   Control/stop worrying 2 2 1 2   Worry too much - different things 1 1 1 2   Trouble relaxing 2 2 0 1  Restless 1 1 0 1  Easily annoyed or irritable 1 1 1 2   Afraid - awful might happen 3 1 1  0  Total GAD 7 Score 13 10 6 10   Anxiety Difficulty Somewhat difficult Somewhat difficult Somewhat difficult Not difficult at all   Relevant past medical, surgical, family and social history reviewed and updated as indicated. Interim medical history since our last visit reviewed. Allergies and medications reviewed and updated.  Review of Systems  Constitutional:  Negative for activity change, appetite change, diaphoresis, fatigue and fever.  Respiratory:  Negative for cough, chest tightness and shortness of breath.   Cardiovascular:  Negative for chest pain, palpitations and leg swelling.  Gastrointestinal: Negative.   Psychiatric/Behavioral:  Positive for decreased concentration  (occasional) and sleep disturbance. Negative for self-injury and suicidal ideas. The patient is nervous/anxious.    Per HPI unless specifically indicated above     Objective:    There were no vitals taken for this visit.  Wt Readings from Last 3 Encounters:  07/31/23 132 lb 12.8 oz (60.2 kg)  12/19/22 136 lb 14.5 oz (62.1 kg)  11/07/22 136 lb 12.8 oz (62.1 kg)    Physical Exam Vitals and nursing note reviewed.  Constitutional:      General: She is awake. She is not in acute distress.    Appearance: She is well-developed. She is not ill-appearing.  HENT:     Head: Normocephalic.     Right Ear: Hearing normal.     Left Ear: Hearing normal.  Eyes:     General: Lids are normal.        Right eye: No discharge.        Left eye: No discharge.     Conjunctiva/sclera: Conjunctivae normal.  Pulmonary:     Effort: Pulmonary effort is normal. No accessory muscle usage or respiratory distress.  Musculoskeletal:     Cervical back: Normal range of motion.  Neurological:     Mental Status: She is alert and oriented to person, place, and time.  Psychiatric:        Attention and Perception: Attention normal.        Mood and Affect: Mood normal.        Behavior: Behavior normal. Behavior is cooperative.        Thought Content: Thought content normal.        Judgment: Judgment normal.    Results for orders placed or performed in visit on 11/07/22  Alpha-Gal Panel   Collection Time: 11/07/22  4:38 PM  Result Value Ref Range   Class Description Allergens Comment    IgE (Immunoglobulin E), Serum 14 6 - 495 IU/mL   Pork IgE <0.10 Class 0 kU/L   Beef IgE <0.10 Class 0 kU/L   Allergen Lamb IgE <0.10 Class 0 kU/L   O215-IgE Alpha-Gal <0.10 Class 0 kU/L  Allergens(14)   Collection Time: 11/07/22  4:38 PM  Result Value Ref Range   Milk IgE <0.10 Class 0 kU/L   Codfish IgE <0.10 Class 0 kU/L   Wheat IgE <0.10 Class 0 kU/L   Allergen Corn, IgE <0.10 Class 0 kU/L   Peanut IgE <0.10 Class 0  kU/L   Soybean IgE <0.10 Class 0 kU/L   Shrimp IgE <0.10 Class 0 kU/L   F037-IgE Mussel <0.10 Class 0 kU/L   Tuna <0.10 Class 0 kU/L   Allergen Salmon IgE <0.10 Class 0 kU/L   Chocolate/Cacao IgE <0.10 Class 0 kU/L   Egg, Whole IgE <0.10 Class  0 kU/L      Assessment & Plan:   Problem List Items Addressed This Visit       Other   Anxiety and depression - Primary   Chronic, stable.  Denies SI/HI.  Continue Effexor  XR 150 MG daily at this time, Wellbutrin  XL 150 MG daily + Vistaril  as needed only for panic.  She is aware if needed she can increase Wellbutrin  to 300 MG and alert provider if changed.  Educated her on medication and side effects + BLACK BOX warning.  Discussed adding on therapy or visit with psychiatry if worsening mood.        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  UDS due next 10/26/23, will obtain at visit in 2 weeks. Controlled subs agreement up to date.  Refills sent in today.  Agrees to every 3 month visits, can be virtual if needed.  Her goal in long run is to transition to Effexor  only if possible and come off Adderall , discussed this at length with her and recommend no cold [place] stopping but can work on gradual reduction over time with addition of Effexor .      Relevant Medications   amphetamine -dextroamphetamine  (ADDERALL  XR) 25 MG 24 hr capsule (Start on 11/20/2023)     Follow up plan: Return for as scheduled in 2 weeks.

## 2023-10-31 NOTE — Assessment & Plan Note (Signed)
 Chronic, ongoing.  UDS due next 10/26/23, will obtain at visit in 2 weeks. Controlled subs agreement up to date.  Refills sent in today.  Agrees to every 3 month visits, can be virtual if needed.  Her goal in long run is to transition to Effexor  only if possible and come off Adderall , discussed this at length with her and recommend no cold [place] stopping but can work on gradual reduction over time with addition of Effexor .

## 2023-11-01 DIAGNOSIS — K5900 Constipation, unspecified: Secondary | ICD-10-CM | POA: Diagnosis not present

## 2023-11-01 DIAGNOSIS — K5730 Diverticulosis of large intestine without perforation or abscess without bleeding: Secondary | ICD-10-CM | POA: Diagnosis not present

## 2023-11-01 LAB — HM COLONOSCOPY

## 2023-11-09 NOTE — Patient Instructions (Addendum)
 You have an order for:  []   2D Mammogram  [x]   3D Mammogram  []   Bone Density     Please call for appointment:  [facility]  [address] [place] [address] [phone] [facility] [address] [place], [place] [address] [phone] [facility] [address] [place], [place] [address] [phone]   Make sure to wear two-piece clothing.  No lotions, powders, or deodorants the day of the appointment. Make sure to bring picture ID and insurance card.  Bring list of medications you are currently taking including any supplements.   Schedule your [facility] screening mammogram through MyChart!   Log into your MyChart account.  Go to 'Visit' (or 'Appointments' if on mobile App) --> Schedule an Appointment  Under 'Select a Reason for Visit' choose the Mammogram Screening option.  Complete the pre-visit questions and select the time and place that best fits your schedule.   Be Involved in Caring For Your Health:  Taking Medications When medications are taken as directed, they can greatly improve your health. But if they are not taken as prescribed, they may not work. In some cases, not taking them correctly can be harmful. To help ensure your treatment remains effective and safe, understand your medications and how to take them. Bring your medications to each visit for review by your provider.  Your lab results, notes, and after visit summary will be available on My Chart. We strongly encourage you to use this feature. If lab results are abnormal the clinic will contact you with the appropriate steps. If the clinic does not contact you assume the results are satisfactory. You can always view your results on My Chart. If you have questions regarding your health or results, please contact the clinic during office hours. You can also ask questions on My  Chart.  We at [facility] are grateful that you chose us  to provide your care. We strive to provide evidence-based and compassionate care and are always looking for feedback. If you get a survey from the clinic please complete this so we can hear your opinions.   Managing Anxiety, Adult After being diagnosed with anxiety, you may be relieved to know why you have felt or behaved a certain way. You may also feel overwhelmed about the treatment ahead and what it will mean for your life. With care and support, you can manage your anxiety. How to manage lifestyle changes Understanding the difference between stress and anxiety Although stress can play a role in anxiety, it is not the same as anxiety. Stress is your body's reaction to life changes and events, both good and bad. Stress is often caused by something external, such as a deadline, test, or competition. It normally goes away after the event has ended and will last just a few hours. But, stress can be ongoing and can lead to more than just stress. Anxiety is caused by something internal, such as imagining a terrible outcome or worrying that something will go wrong that will greatly upset you. Anxiety often does not go away even after the event is over, and it can become a long-term (chronic) worry. Lowering stress and anxiety Talk with your health care provider or a counselor to learn more about lowering anxiety and stress. They may suggest tension-reduction techniques, such as: Music. Spend time creating or listening to music that you enjoy  and that inspires you. Mindfulness-based meditation. Practice being aware of your normal breaths while not trying to control your breathing. It can be done while sitting or walking. Centering prayer. Focus on a word, phrase, or sacred image that means something to you and brings you peace. Deep breathing. Expand your stomach and inhale slowly through your nose. Hold your breath for 3-5 seconds.  Then breathe out slowly, letting your stomach muscles relax. Self-talk. Learn to notice and spot thought patterns that lead to anxiety reactions. Change those patterns to thoughts that feel peaceful. Muscle relaxation. Take time to tense muscles and then relax them. Choose a tension-reduction technique that fits your lifestyle and personality. These techniques take time and practice. Set aside 5-15 minutes a day to do them. Specialized therapists can offer counseling and training in these techniques. The training to help with anxiety may be covered by some insurance plans. Other things you can do to manage stress and anxiety include: Keeping a stress diary. This can help you learn what triggers your reaction and then learn ways to manage your response. Thinking about how you react to certain situations. You may not be able to control everything, but you can control your response. Making time for activities that help you relax and not feeling guilty about spending your time in this way. Doing visual imagery. This involves imagining or creating mental pictures to help you relax. Practicing yoga. Through yoga poses, you can lower tension and relax.  Medicines Medicines for anxiety include: Antidepressant medicines. These are usually prescribed for long-term daily control. Anti-anxiety medicines. These may be added in severe cases, especially when panic attacks occur. When used together, medicines, psychotherapy, and tension-reduction techniques may be the most effective treatment. Relationships Relationships can play a big part in helping you recover. Spend more time connecting with trusted friends and family members. Think about going to couples counseling if you have a partner, taking family education classes, or going to family therapy. Therapy can help you and others better understand your anxiety. How to recognize changes in your anxiety Everyone responds differently to treatment for anxiety.  Recovery from anxiety happens when symptoms lessen and stop interfering with your daily life at home or work. This may mean that you will start to: Have better concentration and focus. Worry will interfere less in your daily thinking. Sleep better. Be less irritable. Have more energy. Have improved memory. Try to recognize when your condition is getting worse. Contact your provider if your symptoms interfere with home or work and you feel like your condition is not improving. Follow these instructions at home: Activity Exercise. Adults should: Exercise for at least 150 minutes each week. The exercise should increase your heart rate and make you sweat (moderate-intensity exercise). Do strengthening exercises at least twice a week. Get the right amount and quality of sleep. Most adults need 7-9 hours of sleep each night. Lifestyle  Eat a healthy diet that includes plenty of vegetables, fruits, whole grains, low-fat dairy products, and lean protein. Do not eat a lot of foods that are high in fats, added sugars, or salt (sodium). Make choices that simplify your life. Do not use any products that contain nicotine or tobacco. These products include cigarettes, chewing tobacco, and vaping devices, such as e-cigarettes. If you need help quitting, ask your provider. Avoid caffeine, alcohol, and certain over-the-counter cold medicines. These may make you feel worse. Ask your pharmacist which medicines to avoid. General instructions Take over-the-counter and prescription medicines only as told  by your provider. Keep all follow-up visits. This is to make sure you are managing your anxiety well or if you need more support. Where to find support You can get help and support from: Self-help groups. Online and [employer]. A trusted spiritual leader. Couples counseling. Family education classes. Family therapy. Where to find more information You may find that joining a support group helps  you deal with your anxiety. The following sources can help you find counselors or support groups near you: Mental Health America: mentalhealthamerica.net Anxiety and Depression Association of [place] (ADAA): adaa.org The [employer] on Mental Illness (NAMI): nami.org Contact a health care provider if: You have a hard time staying focused or finishing tasks. You spend many hours a day feeling worried about everyday life. You are very tired because you cannot stop worrying. You start to have headaches or often feel tense. You have chronic nausea or diarrhea. Get help right away if: Your heart feels like it is racing. You have shortness of breath. You have thoughts of hurting yourself or others. Get help right away if you feel like you may hurt yourself or others, or have thoughts about taking your own life. Go to your nearest emergency room or: Call 911. Call the National Suicide Prevention Lifeline at (850)043-2910 or 988. This is open 24 hours a day. Text the Crisis Text Line at 7155660371. This information is not intended to replace advice given to you by your health care provider. Make sure you discuss any questions you have with your health care provider. Document Revised: 11/21/2021 Document Reviewed: 06/05/2020 Elsevier Patient Education  2024 [employer].

## 2023-11-11 ENCOUNTER — Other Ambulatory Visit (HOSPITAL_COMMUNITY): Payer: Self-pay

## 2023-11-12 ENCOUNTER — Ambulatory Visit (INDEPENDENT_AMBULATORY_CARE_PROVIDER_SITE_OTHER): Admitting: Nurse Practitioner

## 2023-11-12 VITALS — BP 119/83 | HR 97 | Temp 97.9°F | Resp 16 | Ht 65.98 in | Wt 124.0 lb

## 2023-11-12 DIAGNOSIS — Z1231 Encounter for screening mammogram for malignant neoplasm of breast: Secondary | ICD-10-CM | POA: Diagnosis not present

## 2023-11-12 DIAGNOSIS — Z0000 Encounter for general adult medical examination without abnormal findings: Secondary | ICD-10-CM | POA: Diagnosis not present

## 2023-11-12 DIAGNOSIS — F419 Anxiety disorder, unspecified: Secondary | ICD-10-CM | POA: Diagnosis not present

## 2023-11-12 DIAGNOSIS — F988 Other specified behavioral and emotional disorders with onset usually occurring in childhood and adolescence: Secondary | ICD-10-CM

## 2023-11-12 DIAGNOSIS — Z79899 Other long term (current) drug therapy: Secondary | ICD-10-CM | POA: Diagnosis not present

## 2023-11-12 DIAGNOSIS — F32A Depression, unspecified: Secondary | ICD-10-CM

## 2023-11-12 DIAGNOSIS — E7800 Pure hypercholesterolemia, unspecified: Secondary | ICD-10-CM | POA: Diagnosis not present

## 2023-11-12 DIAGNOSIS — Z800 Family history of malignant neoplasm of digestive organs: Secondary | ICD-10-CM

## 2023-11-12 NOTE — Assessment & Plan Note (Signed)
 Chronic, ongoing.  UDS due next 11/11/24. Controlled subs agreement up to date.  Refills sent in today.  Agrees to every 3 month visits, can be virtual if needed.  Her goal in long run is to transition to Effexor  only if possible and come off Adderall , discussed this at length with her and recommend no cold [place] stopping but can work on gradual reduction over time with addition of Effexor .

## 2023-11-12 NOTE — Assessment & Plan Note (Signed)
 Noted on past labs, check lipid panel today and continue diet focus at home. The 10-year ASCVD risk score (Arnett DK, et al., 2019) is: 1%   Values used to calculate the score:     Age: 43 years     Clincally relevant sex: Female     Is Non-Hispanic African American: No     Diabetic: No     Tobacco smoker: No     Systolic Blood Pressure: 119 mmHg     Is BP treated: No     HDL Cholesterol: 50 mg/dL     Total Cholesterol: 221 mg/dL

## 2023-11-12 NOTE — Assessment & Plan Note (Signed)
 Chronic, stable.  Denies SI/HI.  Continue Effexor XR 150 MG daily at this time, Wellbutrin XL 150 MG daily + Vistaril as needed only for panic.  She is aware if needed she can increase Wellbutrin to 300 MG and alert provider if changed.  Educated her on medication and side effects + BLACK BOX warning.  Discussed adding on therapy or visit with psychiatry if worsening mood.

## 2023-11-12 NOTE — Assessment & Plan Note (Signed)
In mother, passed at age 45.  Will plan on monitoring labs yearly and as needed if symptoms.

## 2023-11-12 NOTE — Progress Notes (Signed)
 BP 119/83 (BP Location: Left Arm, Patient Position: Sitting, Cuff Size: Normal)   Pulse 97   Temp 97.9 F (36.6 C) (Oral)   Resp 16   Ht 5' 5.98 (1.676 m)   Wt 124 lb (56.2 kg)   SpO2 98%   BMI 20.02 kg/m    Subjective:    Patient ID: Shirley Rollins, female    DOB: 09/13/1980, 43 y.o.   MRN: [id number]  HPI: Shirley Rollins is a 43 y.o. female presenting on 11/12/2023 for comprehensive medical examination. Current medical complaints include:none  She currently lives with: self and son Menopausal Symptoms: none     10/31/2023   10:08 AM 07/31/2023    8:54 AM 06/10/2023    4:04 PM 11/26/2022   10:59 AM 10/26/2022    9:10 AM  Depression screen PHQ 2/9  Decreased Interest 1 1 0 0 1  Down, Depressed, Hopeless 2 0 2 1 2   PHQ - 2 Score 3 1 2 1 3   Altered sleeping 0  0 0 2  Tired, decreased energy 3  3 2 3   Change in appetite 2  0 0 0  Feeling bad or failure about yourself  0  1 0 1  Trouble concentrating 2  0 2 2  Moving slowly or fidgety/restless 0  0 0 0  Suicidal thoughts 0  0 0 0  PHQ-9 Score 10  6 5 11   Difficult doing work/chores Somewhat difficult  Somewhat difficult Not difficult at all Somewhat difficult       10/31/2023   10:10 AM 07/31/2023    8:54 AM 06/10/2023    4:05 PM 11/26/2022   11:00 AM  GAD 7 : Generalized Anxiety Score  Nervous, Anxious, on Edge 3 2 2 2   Control/stop worrying 2 2 1 2   Worry too much - different things 1 1 1 2   Trouble relaxing 2 2 0 1  Restless 1 1 0 1  Easily annoyed or irritable 1 1 1 2   Afraid - awful might happen 3 1 1  0  Total GAD 7 Score 13 10 6 10   Anxiety Difficulty Somewhat difficult Somewhat difficult Somewhat difficult Not difficult at all      10/25/2021    4:18 PM 09/24/2022    8:57 AM 10/26/2022    9:10 AM 06/10/2023    4:04 PM 07/31/2023    8:54 AM  Fall Risk  Falls in the past year? 0 0 0 0 0  Was there an injury with Fall? 0 0 0 0 0  Fall Risk Category Calculator 0 0 0 0 0  Fall Risk Category (Retired) Low        (RETIRED) Patient Fall Risk Level Low fall risk       Patient at Risk for Falls Due to No Fall Risks No Fall Risks No Fall Risks No Fall Risks No Fall Risks  Fall risk Follow up Falls prevention discussed   Falls evaluation completed Falls evaluation completed Falls evaluation completed     Data saved with a previous flowsheet row definition    Functional Status Survey: Is the patient deaf or have difficulty hearing?: No Does the patient have difficulty seeing, even when wearing glasses/contacts?: No Does the patient have difficulty concentrating, remembering, or making decisions?: No Does the patient have difficulty walking or climbing stairs?: No Does the patient have difficulty dressing or bathing?: No Does the patient have difficulty doing errands alone such as visiting a doctor's office or shopping?: No  Past Medical History:  Past Medical History:  Diagnosis Date   ADD (attention deficit disorder)    Allergy    Noted in chart   Anemia    Anxiety    In chart   Depression In chart   Migraines     Surgical History:  Past Surgical History:  Procedure Laterality Date   etopic pregnancy  02/26/1997   TONSILLECTOMY  in 86's    Medications:  Current Outpatient Medications on File Prior to Visit  Medication Sig   [START ON 11/20/2023] amphetamine -dextroamphetamine  (ADDERALL  XR) 25 MG 24 hr capsule Take 1 capsule by mouth every morning.   [START ON 12/20/2023] amphetamine -dextroamphetamine  (ADDERALL  XR) 25 MG 24 hr capsule Take 1 capsule by mouth every morning.   [START ON 01/18/2024] amphetamine -dextroamphetamine  (ADDERALL  XR) 25 MG 24 hr capsule Take 1 capsule by mouth every morning.   bisacodyl  (DULCOLAX) 5 MG EC tablet Take as directed   buPROPion  (WELLBUTRIN  XL) 150 MG 24 hr tablet Take 1 tablet (150 mg total) by mouth daily.   hydrOXYzine  (VISTARIL ) 25 MG capsule Take 1 capsule (25 mg total) by mouth every 8 (eight) hours as needed.   Multiple Vitamin (MULTIVITAMIN)  tablet Take 1 tablet by mouth daily.   venlafaxine  XR (EFFEXOR -XR) 150 MG 24 hr capsule Take 1 capsule (150 mg total) by mouth daily with breakfast.   No current facility-administered medications on file prior to visit.    Allergies:  Allergies  Allergen Reactions   Penicillin G Anaphylaxis   Sulfa Antibiotics Other (See Comments)    Social History:  Social History   Socioeconomic History   Marital status: Divorced    Spouse name: Not on file   Number of children: 1   Years of education: Not on file   Highest education level: Bachelor's degree (e.g., BA, AB, BS)  Occupational History   Occupation: dietician  Tobacco Use   Smoking status: Former    Current packs/day: 0.00    Types: Cigarettes    Quit date: 09/01/2018    Years since quitting: 5.2   Smokeless tobacco: Never  Vaping Use   Vaping status: Never Used  Substance and Sexual Activity   Alcohol use: Not Currently    Alcohol/week: 1.0 - 2.0 standard drink of alcohol    Types: 1 - 2 Glasses of wine per week    Comment: occassional   Drug use: No   Sexual activity: Not Currently    Birth control/protection: Condom  Other Topics Concern   Not on file  Social History Narrative   Not on file   Social Drivers of Health   Financial Resource Strain: Low Risk  (11/12/2023)   Overall Financial Resource Strain (CARDIA)    Difficulty of Paying Living Expenses: Not very hard  Food Insecurity: No Food Insecurity (11/12/2023)   Hunger Vital Sign    Worried About Running Out of Food in the Last Year: Never true    Ran Out of Food in the Last Year: Never true  Transportation Needs: No Transportation Needs (11/12/2023)   PRAPARE - Administrator, Civil Service (Medical): No    Lack of Transportation (Non-Medical): No  Physical Activity: Sufficiently Active (11/12/2023)   Exercise Vital Sign    Days of Exercise per Week: 5 days    Minutes of Exercise per Session: 40 min  Stress: Stress Concern Present (11/12/2023)    [employer] of Occupational Health - Occupational Stress Questionnaire    Feeling of Stress: To some  extent  Social Connections: Socially Isolated (11/12/2023)   Social Connection and Isolation Panel    Frequency of Communication with Friends and Family: More than three times a week    Frequency of Social Gatherings with Friends and Family: Once a week    Attends Religious Services: Never    Database administrator or Organizations: No    Attends Engineer, structural: Not on file    Marital Status: Divorced  Intimate Partner Violence: Not At Risk (07/31/2023)   Humiliation, Afraid, Rape, and Kick questionnaire    Fear of Current or Ex-Partner: No    Emotionally Abused: No    Physically Abused: No    Sexually Abused: No   Social History   Tobacco Use  Smoking Status Former   Current packs/day: 0.00   Types: Cigarettes   Quit date: 09/01/2018   Years since quitting: 5.2  Smokeless Tobacco Never   Social History   Substance and Sexual Activity  Alcohol Use Not Currently   Alcohol/week: 1.0 - 2.0 standard drink of alcohol   Types: 1 - 2 Glasses of wine per week   Comment: occassional    Family History:  Family History  Problem Relation Age of Onset   Diabetes Mother    Pancreatic cancer Mother 40   Cancer Mother        pancreatic   Early death Mother    Early death Father    Alcohol abuse Maternal Grandmother    Heart attack Maternal Grandfather    Heart disease Maternal Grandfather    Alcohol abuse Paternal Aunt    COPD Paternal Aunt    Alcohol abuse Maternal Uncle    Hyperlipidemia Maternal Uncle    Stroke Maternal Uncle    Anxiety disorder Maternal Aunt    Depression Maternal Aunt    Early death Maternal Aunt    Breast cancer Neg Hx     Past medical history, surgical history, medications, allergies, family history and social history reviewed with patient today and changes made to appropriate areas of the chart.   ROS All other ROS negative  except what is listed above and in the HPI.      Objective:    BP 119/83 (BP Location: Left Arm, Patient Position: Sitting, Cuff Size: Normal)   Pulse 97   Temp 97.9 F (36.6 C) (Oral)   Resp 16   Ht 5' 5.98 (1.676 m)   Wt 124 lb (56.2 kg)   SpO2 98%   BMI 20.02 kg/m   Wt Readings from Last 3 Encounters:  11/12/23 124 lb (56.2 kg)  07/31/23 132 lb 12.8 oz (60.2 kg)  12/19/22 136 lb 14.5 oz (62.1 kg)    Physical Exam Vitals and nursing note reviewed. Exam conducted with a chaperone present.  Constitutional:      General: She is awake. She is not in acute distress.    Appearance: She is well-developed and well-groomed. She is not ill-appearing or toxic-appearing.  HENT:     Head: Normocephalic and atraumatic.     Right Ear: Hearing, tympanic membrane, ear canal and external ear normal. No drainage.     Left Ear: Hearing, tympanic membrane, ear canal and external ear normal. No drainage.     Nose: Nose normal.     Right Sinus: No maxillary sinus tenderness or frontal sinus tenderness.     Left Sinus: No maxillary sinus tenderness or frontal sinus tenderness.     Mouth/Throat:     Mouth: Mucous membranes  are moist.     Pharynx: Oropharynx is clear. Uvula midline. No pharyngeal swelling, oropharyngeal exudate or posterior oropharyngeal erythema.  Eyes:     General: Lids are normal.        Right eye: No discharge.        Left eye: No discharge.     Extraocular Movements: Extraocular movements intact.     Conjunctiva/sclera: Conjunctivae normal.     Pupils: Pupils are equal, round, and reactive to light.     Visual Fields: Right eye visual fields normal and left eye visual fields normal.  Neck:     Thyroid: No thyromegaly.     Vascular: No carotid bruit.     Trachea: Trachea normal.  Cardiovascular:     Rate and Rhythm: Normal rate and regular rhythm.     Heart sounds: Normal heart sounds. No murmur heard.    No gallop.  Pulmonary:     Effort: Pulmonary effort is normal.  No accessory muscle usage or respiratory distress.     Breath sounds: Normal breath sounds.  Chest:  Breasts:    Right: Normal.     Left: Normal.  Abdominal:     General: Bowel sounds are normal.     Palpations: Abdomen is soft. There is no hepatomegaly or splenomegaly.     Tenderness: There is no abdominal tenderness.  Musculoskeletal:        General: Normal range of motion.     Cervical back: Normal range of motion and neck supple.     Right lower leg: No edema.     Left lower leg: No edema.  Lymphadenopathy:     Head:     Right side of head: No submental, submandibular, tonsillar, preauricular or posterior auricular adenopathy.     Left side of head: No submental, submandibular, tonsillar, preauricular or posterior auricular adenopathy.     Cervical: No cervical adenopathy.     Upper Body:     Right upper body: No supraclavicular, axillary or pectoral adenopathy.     Left upper body: No supraclavicular, axillary or pectoral adenopathy.  Skin:    General: Skin is warm and dry.     Capillary Refill: Capillary refill takes less than 2 seconds.     Findings: No rash.  Neurological:     Mental Status: She is alert and oriented to person, place, and time.     Gait: Gait is intact.     Deep Tendon Reflexes: Reflexes are normal and symmetric.     Reflex Scores:      Brachioradialis reflexes are 2+ on the right side and 2+ on the left side.      Patellar reflexes are 2+ on the right side and 2+ on the left side. Psychiatric:        Attention and Perception: Attention normal.        Mood and Affect: Mood normal.        Speech: Speech normal.        Behavior: Behavior normal. Behavior is cooperative.        Thought Content: Thought content normal.        Judgment: Judgment normal.     Results for orders placed or performed in visit on 11/07/22  Alpha-Gal Panel   Collection Time: 11/07/22  4:38 PM  Result Value Ref Range   Class Description Allergens Comment    IgE  (Immunoglobulin E), Serum 14 6 - 495 IU/mL   Pork IgE <0.10 Class 0 kU/L  Beef IgE <0.10 Class 0 kU/L   Allergen Lamb IgE <0.10 Class 0 kU/L   [id number] Alpha-Gal <0.10 Class 0 kU/L  Allergens(14)   Collection Time: 11/07/22  4:38 PM  Result Value Ref Range   Milk IgE <0.10 Class 0 kU/L   Codfish IgE <0.10 Class 0 kU/L   Wheat IgE <0.10 Class 0 kU/L   Allergen Corn, IgE <0.10 Class 0 kU/L   Peanut IgE <0.10 Class 0 kU/L   Soybean IgE <0.10 Class 0 kU/L   Shrimp IgE <0.10 Class 0 kU/L   [id number] Mussel <0.10 Class 0 kU/L   Tuna <0.10 Class 0 kU/L   Allergen Salmon IgE <0.10 Class 0 kU/L   Chocolate/Cacao IgE <0.10 Class 0 kU/L   Egg, Whole IgE <0.10 Class 0 kU/L      Assessment & Plan:   Problem List Items Addressed This Visit       Other   Family history of pancreatic cancer   In mother, passed at age 79.  Will plan on monitoring labs yearly and as needed if symptoms.      Relevant Orders   CBC with Differential/Platelet   Amylase   Lipase   Elevated low density lipoprotein (LDL) cholesterol level   Noted on past labs, check lipid panel today and continue diet focus at home. The 10-year ASCVD risk score (Arnett DK, et al., 2019) is: 1%   Values used to calculate the score:     Age: 21 years     Clincally relevant sex: Female     Is Non-Hispanic African American: No     Diabetic: No     Tobacco smoker: No     Systolic Blood Pressure: 119 mmHg     Is BP treated: No     HDL Cholesterol: 50 mg/dL     Total Cholesterol: 221 mg/dL       Relevant Orders   Comprehensive metabolic panel with GFR   Lipid Panel w/o Chol/HDL Ratio   Anxiety and depression - Primary   Chronic, stable.  Denies SI/HI.  Continue Effexor  XR 150 MG daily at this time, Wellbutrin  XL 150 MG daily + Vistaril  as needed only for panic.  She is aware if needed she can increase Wellbutrin  to 300 MG and alert provider if changed.  Educated her on medication and side effects + BLACK BOX warning.   Discussed adding on therapy or visit with psychiatry if worsening mood.        Relevant Orders   [id number] 11+Oxyco+Alc+Crt-Bund   CBC with Differential/Platelet   TSH   ADD (attention deficit disorder) without hyperactivity   Chronic, ongoing.  UDS due next 11/11/24. Controlled subs agreement up to date.  Refills sent in today.  Agrees to every 3 month visits, can be virtual if needed.  Her goal in long run is to transition to Effexor  only if possible and come off Adderall , discussed this at length with her and recommend no cold [place] stopping but can work on gradual reduction over time with addition of Effexor .      Relevant Orders   [id number] 11+Oxyco+Alc+Crt-Bund   Other Visit Diagnoses       High risk medication use       UDS obtained today.   Relevant Orders   [id number] 11+Oxyco+Alc+Crt-Bund     Encounter for screening mammogram for malignant neoplasm of breast       Mammogram ordered   Relevant Orders   MM 3D SCREENING MAMMOGRAM BILATERAL BREAST  Encounter for annual physical exam       Annual physical today with labs and health maintenance reviewed, discussed with patient.         Follow up plan: Return in about 3 months (around 02/11/2024) for ADD - virtual.   LABORATORY TESTING:  - Pap smear: Up To Date  IMMUNIZATIONS:   - Tdap: Tetanus vaccination status reviewed: last tetanus booster within 10 years. - Influenza: Up to date - Pneumovax: Not applicable - Prevnar: Not applicable - HPV: Not applicable - Zostavax vaccine: Not applicable  SCREENING: -Mammogram: Up to date  - ordered today - Colonoscopy: Up To Date with [facility] - Bone Density: Not applicable  -Hearing Test: Not applicable  -Spirometry: Not applicable   PATIENT COUNSELING:   Advised to take 1 mg of folate supplement per day if capable of pregnancy.   Sexuality: Discussed sexually transmitted diseases, partner selection, use of condoms, avoidance of unintended pregnancy  and contraceptive  alternatives.   Advised to avoid cigarette smoking.  I discussed with the patient that most people either abstain from alcohol or drink within safe limits (<=14/week and <=4 drinks/occasion for males, <=7/weeks and <= 3 drinks/occasion for females) and that the risk for alcohol disorders and other health effects rises proportionally with the number of drinks per week and how often a drinker exceeds daily limits.  Discussed cessation/primary prevention of drug use and availability of treatment for abuse.   Diet: Encouraged to adjust caloric intake to maintain  or achieve ideal body weight, to reduce intake of dietary saturated fat and total fat, to limit sodium intake by avoiding high sodium foods and not adding table salt, and to maintain adequate dietary potassium and calcium preferably from fresh fruits, vegetables, and low-fat dairy products.    Stressed the importance of regular exercise  Injury prevention: Discussed safety belts, safety helmets, smoke detector, smoking near bedding or upholstery.   Dental health: Discussed importance of regular tooth brushing, flossing, and dental visits.    NEXT PREVENTATIVE PHYSICAL DUE IN 1 YEAR. Return in about 3 months (around 02/11/2024) for ADD - virtual.

## 2023-11-13 ENCOUNTER — Ambulatory Visit: Payer: Self-pay | Admitting: Nurse Practitioner

## 2023-11-13 LAB — CBC WITH DIFFERENTIAL/PLATELET
Basophils Absolute: 0.1 x10E3/uL (ref 0.0–0.2)
Basos: 1 %
EOS (ABSOLUTE): 0.1 x10E3/uL (ref 0.0–0.4)
Eos: 1 %
Hematocrit: 40 % (ref 34.0–46.6)
Hemoglobin: 13.1 g/dL (ref 11.1–15.9)
Immature Grans (Abs): 0 x10E3/uL (ref 0.0–0.1)
Immature Granulocytes: 0 %
Lymphocytes Absolute: 2.3 x10E3/uL (ref 0.7–3.1)
Lymphs: 40 %
MCH: 31.4 pg (ref 26.6–33.0)
MCHC: 32.8 g/dL (ref 31.5–35.7)
MCV: 96 fL (ref 79–97)
Monocytes Absolute: 0.4 x10E3/uL (ref 0.1–0.9)
Monocytes: 8 %
Neutrophils Absolute: 2.9 x10E3/uL (ref 1.4–7.0)
Neutrophils: 50 %
Platelets: 345 x10E3/uL (ref 150–450)
RBC: 4.17 x10E6/uL (ref 3.77–5.28)
RDW: 11.9 % (ref 11.7–15.4)
WBC: 5.8 x10E3/uL (ref 3.4–10.8)

## 2023-11-13 LAB — COMPREHENSIVE METABOLIC PANEL WITH GFR
ALT: 19 IU/L (ref 0–32)
AST: 18 IU/L (ref 0–40)
Albumin: 4.9 g/dL (ref 3.9–4.9)
Alkaline Phosphatase: 41 IU/L (ref 41–116)
BUN/Creatinine Ratio: 9 (ref 9–23)
BUN: 7 mg/dL (ref 6–24)
Bilirubin Total: 0.3 mg/dL (ref 0.0–1.2)
CO2: 23 mmol/L (ref 20–29)
Calcium: 9.9 mg/dL (ref 8.7–10.2)
Chloride: 97 mmol/L (ref 96–106)
Creatinine, Ser: 0.76 mg/dL (ref 0.57–1.00)
Globulin, Total: 2.4 g/dL (ref 1.5–4.5)
Glucose: 88 mg/dL (ref 70–99)
Potassium: 4 mmol/L (ref 3.5–5.2)
Sodium: 134 mmol/L (ref 134–144)
Total Protein: 7.3 g/dL (ref 6.0–8.5)
eGFR: 98 mL/min/1.73 (ref >59)

## 2023-11-13 LAB — LIPID PANEL W/O CHOL/HDL RATIO
Cholesterol, Total: 190 mg/dL (ref 100–199)
HDL: 49 mg/dL (ref >39)
LDL Chol Calc (NIH): 109 mg/dL — ABNORMAL HIGH (ref 0–99)
Triglycerides: 184 mg/dL — ABNORMAL HIGH (ref 0–149)
VLDL Cholesterol Cal: 32 mg/dL (ref 5–40)

## 2023-11-13 LAB — TSH: TSH: 2.29 u[IU]/mL (ref 0.450–4.500)

## 2023-11-13 LAB — AMYLASE: Amylase: 60 U/L (ref 31–110)

## 2023-11-13 LAB — LIPASE: Lipase: 35 U/L (ref 14–72)

## 2023-11-13 NOTE — Progress Notes (Signed)
 Contacted via MyChart  Good day Shirley Rollins, your labs have returned and overall remain stable with exception of LDL, bad cholesterol, remains elevated.  Continue focus on healthy diet and regular exercise.  LDL has trended down from past checks.  Any questions? Keep being stellar!!  Thank you for allowing me to participate in your care.  I appreciate you. Kindest regards, Jeanny Rymer

## 2023-11-14 LAB — DRUG SCREEN 764883 11+OXYCO+ALC+CRT-BUND

## 2023-11-17 LAB — DRUG SCREEN 764883 11+OXYCO+ALC+CRT-BUND
BENZODIAZ UR QL: NEGATIVE ng/mL
Barbiturate screen, urine: NEGATIVE ng/mL
Cocaine (Metab.): NEGATIVE ng/mL
Creatinine, Urine: 65.6 mg/dL (ref 20.0–300.0)
Meperidine: NEGATIVE ng/mL
Methadone Screen, Urine: NEGATIVE ng/mL
Nitrite Urine, Quantitative: NEGATIVE ug/mL
OPIATE SCREEN URINE: NEGATIVE ng/mL
Oxycodone/Oxymorphone, Urine: NEGATIVE ng/mL
PCP Quant, Ur: NEGATIVE ng/mL
Propoxyphene: NEGATIVE ng/mL
Tramadol: NEGATIVE ng/mL
pH, Urine: 5.4 (ref 4.5–8.9)

## 2023-11-17 LAB — DRUG PROFILE 799016
Amphetamine GC/MS Conf: >3000 ng/mL
Amphetamine: POSITIVE — AB
Amphetamines: POSITIVE — AB
Methamphetamine: NEGATIVE

## 2023-11-17 LAB — CANNABINOID CONFIRMATION, UR
CANNABINOIDS: POSITIVE — AB
Carboxy THC GC/MS Conf: 15 ng/mL

## 2023-11-17 LAB — ETHANOL CONFIRM, URINE
Ethanol, Ur - Confirmation: 0.173 %
Ethanol, Urine: POSITIVE — AB

## 2023-11-18 ENCOUNTER — Encounter: Payer: Self-pay | Admitting: Nurse Practitioner

## 2023-11-18 NOTE — Progress Notes (Signed)
Records have been requested via fax. Thank you.

## 2023-11-21 ENCOUNTER — Other Ambulatory Visit (HOSPITAL_COMMUNITY): Payer: Self-pay

## 2023-12-05 ENCOUNTER — Other Ambulatory Visit (HOSPITAL_COMMUNITY): Payer: Self-pay

## 2023-12-06 ENCOUNTER — Other Ambulatory Visit (HOSPITAL_COMMUNITY): Payer: Self-pay

## 2023-12-09 ENCOUNTER — Encounter: Payer: Self-pay | Admitting: Nurse Practitioner

## 2023-12-25 ENCOUNTER — Other Ambulatory Visit (HOSPITAL_COMMUNITY): Payer: Self-pay

## 2024-01-22 ENCOUNTER — Ambulatory Visit
Admission: RE | Admit: 2024-01-22 | Discharge: 2024-01-22 | Disposition: A | Discharge status: Home / Self Care | Source: Ambulatory Visit | Attending: Nurse Practitioner | Admitting: Nurse Practitioner

## 2024-01-22 DIAGNOSIS — Z1231 Encounter for screening mammogram for malignant neoplasm of breast: Secondary | ICD-10-CM | POA: Diagnosis not present

## 2024-01-28 ENCOUNTER — Other Ambulatory Visit (HOSPITAL_COMMUNITY): Payer: Self-pay

## 2024-01-29 NOTE — Progress Notes (Signed)
 Contacted via MyChart   Normal mammogram, may repeat in one year:)

## 2024-02-09 NOTE — Patient Instructions (Signed)
 Be Involved in Caring For Your Health:  Taking Medications When medications are taken as directed, they can greatly improve your health. But if they are not taken as prescribed, they may not work. In some cases, not taking them correctly can be harmful. To help ensure your treatment remains effective and safe, understand your medications and how to take them. Bring your medications to each visit for review by your provider.  Your lab results, notes, and after visit summary will be available on My Chart. We strongly encourage you to use this feature. If lab results are abnormal the clinic will contact you with the appropriate steps. If the clinic does not contact you assume the results are satisfactory. You can always view your results on My Chart. If you have questions regarding your health or results, please contact the clinic during office hours. You can also ask questions on My Chart.  We at [facility] are grateful that you chose [place] to provide your care. We strive to provide evidence-based and compassionate care and are always looking for feedback. If you get a survey from the clinic please complete this so we can hear your opinions.  Managing Anxiety, Adult After being diagnosed with anxiety, you may be relieved to know why you have felt or behaved a certain way. You may also feel overwhelmed about the treatment ahead and what it will mean for your life. With care and support, you can manage your anxiety. How to manage lifestyle changes Understanding the difference between stress and anxiety Although stress can play a role in anxiety, it is not the same as anxiety. Stress is your body's reaction to life changes and events, both good and bad. Stress is often caused by something external, such as a deadline, test, or competition. It normally goes away after the event has ended and will last just a few hours. But, stress can be ongoing and can lead to more than just stress. Anxiety is  caused by something internal, such as imagining a terrible outcome or worrying that something will go wrong that will greatly upset you. Anxiety often does not go away even after the event is over, and it can become a long-term (chronic) worry. Lowering stress and anxiety Talk with your health care provider or a counselor to learn more about lowering anxiety and stress. They may suggest tension-reduction techniques, such as: Music. Spend time creating or listening to music that you enjoy and that inspires you. Mindfulness-based meditation. Practice being aware of your normal breaths while not trying to control your breathing. It can be done while sitting or walking. Centering prayer. Focus on a word, phrase, or sacred image that means something to you and brings you peace. Deep breathing. Expand your stomach and inhale slowly through your nose. Hold your breath for 3-5 seconds. Then breathe out slowly, letting your stomach muscles relax. Self-talk. Learn to notice and spot thought patterns that lead to anxiety reactions. Change those patterns to thoughts that feel peaceful. Muscle relaxation. Take time to tense muscles and then relax them. Choose a tension-reduction technique that fits your lifestyle and personality. These techniques take time and practice. Set aside 5-15 minutes a day to do them. Specialized therapists can offer counseling and training in these techniques. The training to help with anxiety may be covered by some insurance plans. Other things you can do to manage stress and anxiety include: Keeping a stress diary. This can help you learn what triggers your reaction and then learn ways  to manage your response. Thinking about how you react to certain situations. You may not be able to control everything, but you can control your response. Making time for activities that help you relax and not feeling guilty about spending your time in this way. Doing visual imagery. This involves  imagining or creating mental pictures to help you relax. Practicing yoga. Through yoga poses, you can lower tension and relax.  Medicines Medicines for anxiety include: Antidepressant medicines. These are usually prescribed for long-term daily control. Anti-anxiety medicines. These may be added in severe cases, especially when panic attacks occur. When used together, medicines, psychotherapy, and tension-reduction techniques may be the most effective treatment. Relationships Relationships can play a big part in helping you recover. Spend more time connecting with trusted friends and family members. Think about going to couples counseling if you have a partner, taking family education classes, or going to family therapy. Therapy can help you and others better understand your anxiety. How to recognize changes in your anxiety Everyone responds differently to treatment for anxiety. Recovery from anxiety happens when symptoms lessen and stop interfering with your daily life at home or work. This may mean that you will start to: Have better concentration and focus. Worry will interfere less in your daily thinking. Sleep better. Be less irritable. Have more energy. Have improved memory. Try to recognize when your condition is getting worse. Contact your provider if your symptoms interfere with home or work and you feel like your condition is not improving. Follow these instructions at home: Activity Exercise. Adults should: Exercise for at least 150 minutes each week. The exercise should increase your heart rate and make you sweat (moderate-intensity exercise). Do strengthening exercises at least twice a week. Get the right amount and quality of sleep. Most adults need 7-9 hours of sleep each night. Lifestyle  Eat a healthy diet that includes plenty of vegetables, fruits, whole grains, low-fat dairy products, and lean protein. Do not eat a lot of foods that are high in fats, added sugars, or salt  (sodium). Make choices that simplify your life. Do not use any products that contain nicotine or tobacco. These products include cigarettes, chewing tobacco, and vaping devices, such as e-cigarettes. If you need help quitting, ask your provider. Avoid caffeine, alcohol, and certain over-the-counter cold medicines. These may make you feel worse. Ask your pharmacist which medicines to avoid. General instructions Take over-the-counter and prescription medicines only as told by your provider. Keep all follow-up visits. This is to make sure you are managing your anxiety well or if you need more support. Where to find support You can get help and support from: Self-help groups. Online and [employer]. A trusted spiritual leader. Couples counseling. Family education classes. Family therapy. Where to find more information You may find that joining a support group helps you deal with your anxiety. The following sources can help you find counselors or support groups near you: Mental Health America: mentalhealthamerica.net Anxiety and Depression Association of [place] (ADAA): adaa.org The First American on Mental Illness (NAMI): nami.org Contact a health care provider if: You have a hard time staying focused or finishing tasks. You spend many hours a day feeling worried about everyday life. You are very tired because you cannot stop worrying. You start to have headaches or often feel tense. You have chronic nausea or diarrhea. Get help right away if: Your heart feels like it is racing. You have shortness of breath. You have thoughts of hurting yourself or others. Get help  right away if you feel like you may hurt yourself or others, or have thoughts about taking your own life. Go to your nearest emergency room or: Call 911. Call the National Suicide Prevention Lifeline at 765-482-1593 or 988. This is open 24 hours a day. Text the Crisis Text Line at 504 124 9896. This information is not  intended to replace advice given to you by your health care provider. Make sure you discuss any questions you have with your health care provider. Document Revised: 11/21/2021 Document Reviewed: 06/05/2020 Elsevier Patient Education  2024 [employer].

## 2024-02-12 ENCOUNTER — Other Ambulatory Visit (HOSPITAL_COMMUNITY): Payer: Self-pay

## 2024-02-12 ENCOUNTER — Encounter: Payer: Self-pay | Admitting: Nurse Practitioner

## 2024-02-12 ENCOUNTER — Telehealth: Admitting: Nurse Practitioner

## 2024-02-12 DIAGNOSIS — F32A Depression, unspecified: Secondary | ICD-10-CM

## 2024-02-12 DIAGNOSIS — F419 Anxiety disorder, unspecified: Secondary | ICD-10-CM | POA: Diagnosis not present

## 2024-02-12 DIAGNOSIS — N951 Menopausal and female climacteric states: Secondary | ICD-10-CM | POA: Insufficient documentation

## 2024-02-12 DIAGNOSIS — F988 Other specified behavioral and emotional disorders with onset usually occurring in childhood and adolescence: Secondary | ICD-10-CM | POA: Diagnosis not present

## 2024-02-12 MED ORDER — AMPHETAMINE-DEXTROAMPHET ER 25 MG PO CP24: 25.0000 mg | ORAL_CAPSULE | Freq: Every morning | ORAL | 0 refills | Status: AC

## 2024-02-12 MED ORDER — AMPHETAMINE-DEXTROAMPHET ER 25 MG PO CP24
25.0000 mg | ORAL_CAPSULE | Freq: Every morning | ORAL | 0 refills | Status: AC
  Filled 2024-03-03: qty 30, 30d supply, fill #0

## 2024-02-12 MED ORDER — BUPROPION HCL ER (XL) 300 MG PO TB24
300.0000 mg | ORAL_TABLET | Freq: Every day | ORAL | 3 refills | Status: AC
  Filled 2024-02-12: qty 90, 90d supply, fill #0

## 2024-02-12 NOTE — Assessment & Plan Note (Signed)
 Chronic, ongoing.  UDS due next 11/11/24. Controlled subs agreement up to date.  Refills sent in today.  Agrees to every 3 month visits, can be virtual if needed.  Her goal in long run is to transition to Effexor  only if possible and come off Adderall , discussed this at length with her and recommend no cold turkey stopping but can work on gradual reduction over time with addition of Effexor . At this time may need to consider increase in Adderall  for short period if ongoing mood shifts with perimenopause.

## 2024-02-12 NOTE — Assessment & Plan Note (Addendum)
 Struggling with this: night sweats, mood lability, cycle changes, brain fog. Could also consider addition of hormone therapy in future since perimenopause is triggering worsening mood symptoms.

## 2024-02-12 NOTE — Assessment & Plan Note (Signed)
 Chronic, stable.  Denies SI/HI.  Continue Effexor  XR 150 MG daily at this time and Vistaril  as needed only for panic. Increase Wellbutrin  to 300 MG XL dosing, educated her on this and we discussed. Educated her on medication and side effects + BLACK BOX warning.  Discussed adding on therapy or visit with psychiatry if worsening mood OR possibly going up on Adderall . Could also consider addition of hormone therapy in future since perimenopause is triggering worsening symptoms.

## 2024-02-12 NOTE — Progress Notes (Signed)
 LMP 01/01/2024    Subjective:    Patient ID: Shirley Rollins, female    DOB: [birth year], 43 y.o.   MRN: [id number]  HPI: Shirley Rollins is a 43 y.o. female  Chief Complaint  Patient presents with   Depression   Anxiety   ADHD   Virtual Visit via Video Note  I connected with Shirley Rollins on 02/12/2024 at  9:00 AM EST by a video enabled telemedicine application and verified that I am speaking with the correct person using two identifiers.  Location: Patient: work [employer]: work   I discussed the limitations of evaluation and management by telemedicine and the availability of in person appointments. The patient expressed understanding and agreed to proceed.  I discussed the assessment and treatment plan with the patient. The patient was provided an opportunity to ask questions and all were answered. The patient agreed with the plan and demonstrated an understanding of the instructions.   The patient was advised to call back or seek an in-person evaluation if the symptoms worsen or if the condition fails to improve as anticipated.  I provided 25 minutes of non-face-to-face time during this encounter.   Shirley [place] T Aniello Christopoulos, NP   ADHD FOLLOW UP Continues Adderall  XR 25 MG daily, Effexor  XR 150 MG daily, and Wellbutrin  XL 150 MG daily.  She been on Adderall  and Effexor  for > 10 years and overall works well. PDMP review 01/28/24. She finds that perimenopause is worsening symptoms: more emotionally labile, night sweats, brain fog, and more struggle with focusing. ADHD status: stable Satisfied with current therapy: yes Medication compliance:  good compliance Controlled substance contract: yes Previous psychiatry evaluation: yes Previous medications: Adderall  Taking meds on weekends/vacations: yes Work/school performance:  average Difficulty sustaining attention/completing tasks: no Distracted by extraneous stimuli: yes Does not listen when spoken to: yes Fidgets with hands  or feet: yes Unable to stay in seat: occasional Blurts out/interrupts others: no ADHD Medication Side Effects: no    Decreased appetite: no    Headache: no    Sleeping disturbance pattern: no    Irritability: no    Rebound effects (worse than baseline) off medication: no    Anxiousness: no    Dizziness: no    Tics: no     02/12/2024    9:07 AM 10/31/2023   10:08 AM 07/31/2023    8:54 AM 06/10/2023    4:04 PM 11/26/2022   10:59 AM  Depression screen PHQ 2/9  Decreased Interest 2 1 1  0 0  Down, Depressed, Hopeless 1 2 0 2 1  PHQ - 2 Score 3 3 1 2 1   Altered sleeping 3 0  0 0  Tired, decreased energy 3 3  3 2   Change in appetite 0 2  0 0  Feeling bad or failure about yourself  1 0  1 0  Trouble concentrating 3 2  0 2  Moving slowly or fidgety/restless 0 0  0 0  Suicidal thoughts 0 0  0 0  PHQ-9 Score 13 10   6  5    Difficult doing work/chores Somewhat difficult Somewhat difficult  Somewhat difficult Not difficult at all     Data saved with a previous flowsheet row definition       02/12/2024    9:08 AM 10/31/2023   10:10 AM 07/31/2023    8:54 AM 06/10/2023    4:05 PM  GAD 7 : Generalized Anxiety Score  Nervous, Anxious, on Edge 3 3 2  2  Control/stop worrying 1 2 2 1   Worry too much - different things 2 1 1 1   Trouble relaxing 3 2 2  0  Restless 2 1 1  0  Easily annoyed or irritable 3 1 1 1   Afraid - awful might happen 1 3 1 1   Total GAD 7 Score 15 13 10 6   Anxiety Difficulty Somewhat difficult Somewhat difficult Somewhat difficult Somewhat difficult   Relevant past medical, surgical, family and social history reviewed and updated as indicated. Interim medical history since our last visit reviewed. Allergies and medications reviewed and updated.  Review of Systems  Constitutional:  Negative for activity change, appetite change, diaphoresis, fatigue and fever.  Respiratory:  Negative for cough, chest tightness and shortness of breath.   Cardiovascular:  Negative for chest  pain, palpitations and leg swelling.  Gastrointestinal: Negative.   Psychiatric/Behavioral:  Positive for decreased concentration (occasional) and sleep disturbance. Negative for self-injury and suicidal ideas. The patient is nervous/anxious.    Per HPI unless specifically indicated above     Objective:    LMP 01/01/2024   Wt Readings from Last 3 Encounters:  11/12/23 124 lb (56.2 kg)  07/31/23 132 lb 12.8 oz (60.2 kg)  12/19/22 136 lb 14.5 oz (62.1 kg)    Physical Exam Vitals and nursing note reviewed.  Constitutional:      General: She is awake. She is not in acute distress.    Appearance: She is well-developed. She is not ill-appearing.  HENT:     Head: Normocephalic.     Right Ear: Hearing normal.     Left Ear: Hearing normal.  Eyes:     General: Lids are normal.        Right eye: No discharge.        Left eye: No discharge.     Conjunctiva/sclera: Conjunctivae normal.  Pulmonary:     Effort: Pulmonary effort is normal. No accessory muscle usage or respiratory distress.  Musculoskeletal:     Cervical back: Normal range of motion.  Neurological:     Mental Status: She is alert and oriented to person, place, and time.  Psychiatric:        Attention and Perception: Attention normal.        Mood and Affect: Mood normal.        Behavior: Behavior normal. Behavior is cooperative.        Thought Content: Thought content normal.        Judgment: Judgment normal.    Results for orders placed or performed in visit on 12/09/23  HM COLONOSCOPY   Collection Time: 11/01/23 12:00 AM  Result Value Ref Range   HM Colonoscopy See Report (in chart) See Report (in chart), Patient Reported      Assessment & Plan:   Problem List Items Addressed This Visit       Other   Perimenopausal   Struggling with this: night sweats, mood lability, cycle changes, brain fog. Could also consider addition of hormone therapy in future since perimenopause is triggering worsening mood symptoms.       Anxiety and depression   Chronic, stable.  Denies SI/HI.  Continue Effexor  XR 150 MG daily at this time and Vistaril  as needed only for panic. Increase Wellbutrin  to 300 MG XL dosing, educated her on this and we discussed. Educated her on medication and side effects + BLACK BOX warning.  Discussed adding on therapy or visit with psychiatry if worsening mood OR possibly going up on Adderall . Could also consider  addition of hormone therapy in future since perimenopause is triggering worsening symptoms.      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  UDS due next 11/11/24. Controlled subs agreement up to date.  Refills sent in today.  Agrees to every 3 month visits, can be virtual if needed.  Her goal in long run is to transition to Effexor  only if possible and come off Adderall , discussed this at length with her and recommend no cold turkey stopping but can work on gradual reduction over time with addition of Effexor . At this time may need to consider increase in Adderall  for short period if ongoing mood shifts with perimenopause.      Relevant Medications   amphetamine -dextroamphetamine  (ADDERALL  XR) 25 MG 24 hr capsule (Start on 04/25/2024)      Follow up plan: Return in about 4 weeks (around 03/11/2024) for ANXIETY AND PERIMENOPAUSE.

## 2024-02-21 ENCOUNTER — Other Ambulatory Visit (HOSPITAL_COMMUNITY): Payer: Self-pay

## 2024-03-03 ENCOUNTER — Other Ambulatory Visit (HOSPITAL_COMMUNITY): Payer: Self-pay

## 2024-03-07 NOTE — Patient Instructions (Signed)
 Managing Anxiety, Adult  After being diagnosed with anxiety, you may be relieved to know why you have felt or behaved a certain way. You may also feel overwhelmed about the treatment ahead and what it will mean for your life. With care and support, you can manage your anxiety.  How to manage lifestyle changes  Understanding the difference between stress and anxiety  Although stress can play a role in anxiety, it is not the same as anxiety. Stress is your body's reaction to life changes and events, both good and bad. Stress is often caused by something external, such as a deadline, test, or competition. It normally goes away after the event has ended and will last just a few hours. But, stress can be ongoing and can lead to more than just stress.  Anxiety is caused by something internal, such as imagining a terrible outcome or worrying that something will go wrong that will greatly upset you. Anxiety often does not go away even after the event is over, and it can become a long-term (chronic) worry.  Lowering stress and anxiety    Talk with your health care provider or a counselor to learn more about lowering anxiety and stress. They may suggest tension-reduction techniques, such as:  Music. Spend time creating or listening to music that you enjoy and that inspires you.  Mindfulness-based meditation. Practice being aware of your normal breaths while not trying to control your breathing. It can be done while sitting or walking.  Centering prayer. Focus on a word, phrase, or sacred image that means something to you and brings you peace.  Deep breathing. Expand your stomach and inhale slowly through your nose. Hold your breath for 3-5 seconds. Then breathe out slowly, letting your stomach muscles relax.  Self-talk. Learn to notice and spot thought patterns that lead to anxiety reactions. Change those patterns to thoughts that feel peaceful.  Muscle relaxation. Take time to tense muscles and then relax them.  Choose a  tension-reduction technique that fits your lifestyle and personality. These techniques take time and practice. Set aside 5-15 minutes a day to do them. Specialized therapists can offer counseling and training in these techniques. The training to help with anxiety may be covered by some insurance plans.  Other things you can do to manage stress and anxiety include:  Keeping a stress diary. This can help you learn what triggers your reaction and then learn ways to manage your response.  Thinking about how you react to certain situations. You may not be able to control everything, but you can control your response.  Making time for activities that help you relax and not feeling guilty about spending your time in this way.  Doing visual imagery. This involves imagining or creating mental pictures to help you relax.  Practicing yoga. Through yoga poses, you can lower tension and relax.     Medicines  Medicines for anxiety include:  Antidepressant medicines. These are usually prescribed for long-term daily control.  Anti-anxiety medicines. These may be added in severe cases, especially when panic attacks occur.  When used together, medicines, psychotherapy, and tension-reduction techniques may be the most effective treatment.  Relationships  Relationships can play a big part in helping you recover. Spend more time connecting with trusted friends and family members. Think about going to couples counseling if you have a partner, taking family education classes, or going to family therapy. Therapy can help you and others better understand your anxiety.  How to recognize changes in  your anxiety  Everyone responds differently to treatment for anxiety. Recovery from anxiety happens when symptoms lessen and stop interfering with your daily life at home or work. This may mean that you will start to:  Have better concentration and focus. Worry will interfere less in your daily thinking.  Sleep better.  Be less irritable.  Have  more energy.  Have improved memory.  Try to recognize when your condition is getting worse. Contact your provider if your symptoms interfere with home or work and you feel like your condition is not improving.  Follow these instructions at home:  Activity  Exercise. Adults should:  Exercise for at least 150 minutes each week. The exercise should increase your heart rate and make you sweat (moderate-intensity exercise).  Do strengthening exercises at least twice a week.  Get the right amount and quality of sleep. Most adults need 7-9 hours of sleep each night.  Lifestyle    Eat a healthy diet that includes plenty of vegetables, fruits, whole grains, low-fat dairy products, and lean protein.  Do not eat a lot of foods that are high in fats, added sugars, or salt (sodium).  Make choices that simplify your life.  Do not use any products that contain nicotine or tobacco. These products include cigarettes, chewing tobacco, and vaping devices, such as e-cigarettes. If you need help quitting, ask your provider.  Avoid caffeine, alcohol, and certain over-the-counter cold medicines. These may make you feel worse. Ask your pharmacist which medicines to avoid.  General instructions  Take over-the-counter and prescription medicines only as told by your provider.  Keep all follow-up visits. This is to make sure you are managing your anxiety well or if you need more support.  Where to find support  You can get help and support from:  Self-help groups.  Online and [employer].  A trusted spiritual leader.  Couples counseling.  Family education classes.  Family therapy.  Where to find more information  You may find that joining a support group helps you deal with your anxiety. The following sources can help you find counselors or support groups near you:  Mental Health America: mentalhealthamerica.net  Anxiety and Depression Association of [place] (ADAA): adaa.org  The [employer] on Mental Illness (NAMI):  nami.org  Contact a health care provider if:  You have a hard time staying focused or finishing tasks.  You spend many hours a day feeling worried about everyday life.  You are very tired because you cannot stop worrying.  You start to have headaches or often feel tense.  You have chronic nausea or diarrhea.  Get help right away if:  Your heart feels like it is racing.  You have shortness of breath.  You have thoughts of hurting yourself or others.  Get help right away if you feel like you may hurt yourself or others, or have thoughts about taking your own life. Go to your nearest emergency room or:  Call 911.  Call the National Suicide Prevention Lifeline at (562)309-0957 or 988. This is open 24 hours a day.  Text the Crisis Text Line at 786-142-3811.  This information is not intended to replace advice given to you by your health care provider. Make sure you discuss any questions you have with your health care provider.  Document Revised: 11/21/2021 Document Reviewed: 06/05/2020  Elsevier Patient Education  2024 [employer].

## 2024-03-13 ENCOUNTER — Encounter: Payer: Self-pay | Admitting: Nurse Practitioner

## 2024-03-13 ENCOUNTER — Telehealth (INDEPENDENT_AMBULATORY_CARE_PROVIDER_SITE_OTHER): Admitting: Nurse Practitioner

## 2024-03-13 VITALS — Ht 65.98 in | Wt 128.0 lb

## 2024-03-13 DIAGNOSIS — F32A Depression, unspecified: Secondary | ICD-10-CM

## 2024-03-13 DIAGNOSIS — F419 Anxiety disorder, unspecified: Secondary | ICD-10-CM | POA: Diagnosis not present

## 2024-03-13 DIAGNOSIS — N951 Menopausal and female climacteric states: Secondary | ICD-10-CM

## 2024-03-13 DIAGNOSIS — F988 Other specified behavioral and emotional disorders with onset usually occurring in childhood and adolescence: Secondary | ICD-10-CM

## 2024-03-13 NOTE — Progress Notes (Signed)
 Called patient and left a message to call back to get scheduled. Return in about 2 months (around 05/11/2024) for ADD -- refills.

## 2024-03-13 NOTE — Progress Notes (Signed)
 "  Ht 5' 5.98 (1.676 m)   Wt 128 lb (58.1 kg)   BMI 20.67 kg/m    Subjective:    Patient ID: Shirley Rollins, female    DOB: [birth year], 44 y.o.   MRN: [id number]  HPI: Shirley Rollins is a 44 y.o. female  Chief Complaint  Patient presents with   Follow-up    Follow up on increase of wellbutrin    Virtual Visit via Video Note  I connected with Shirley Rollins on 03/13/24 at  2:20 PM EST by a video enabled telemedicine application and verified that I am speaking with the correct person using two identifiers.  Location: Patient: work [employer], [employer]: work   I discussed the limitations of evaluation and management by telemedicine and the availability of in person appointments. The patient expressed understanding and agreed to proceed.  I discussed the assessment and treatment plan with the patient. The patient was provided an opportunity to ask questions and all were answered. The patient agreed with the plan and demonstrated an understanding of the instructions.   The patient was advised to call back or seek an in-person evaluation if the symptoms worsen or if the condition fails to improve as anticipated.  I provided 25 minutes of non-face-to-face time during this encounter.   Andersyn Fragoso T Cinderella Christoffersen, NP   ADHD FOLLOW UP Increased Wellbutrin  XL to 300 Mg on 02/12/24 and she reports this has helped mood a lot. Continues Adderall  XR 25 MG daily and Effexor  XR 150 MG daily.  She has been on Adderall  and Effexor  for > 10 years and overall works well. PDMP review 03/03/24. Continues to struggle with perimenopause symptoms. ADHD status: stable Satisfied with current therapy: yes Medication compliance:  good compliance Controlled substance contract: yes Previous psychiatry evaluation: yes Previous medications: Adderall  Taking meds on weekends/vacations: yes Work/school performance:  average Difficulty sustaining attention/completing tasks: no Distracted by extraneous stimuli: no Does not  listen when spoken to: no Fidgets with hands or feet: yes Unable to stay in seat: occasional Blurts out/interrupts others: no ADHD Medication Side Effects: no    Decreased appetite: no    Headache: no    Sleeping disturbance pattern: no    Irritability: no    Rebound effects (worse than baseline) off medication: no    Anxiousness: no    Dizziness: no    Tics: no     03/13/2024    2:23 PM 02/12/2024    9:07 AM 10/31/2023   10:08 AM 07/31/2023    8:54 AM 06/10/2023    4:04 PM  Depression screen PHQ 2/9  Decreased Interest 1 2 1 1  0  Down, Depressed, Hopeless 0 1 2 0 2  PHQ - 2 Score 1 3 3 1 2   Altered sleeping 2 3 0  0  Tired, decreased energy 2 3 3  3   Change in appetite 0 0 2  0  Feeling bad or failure about yourself  0 1 0  1  Trouble concentrating 1 3 2   0  Moving slowly or fidgety/restless 0 0 0  0  Suicidal thoughts 0 0 0  0  PHQ-9 Score 6 13 10   6    Difficult doing work/chores Somewhat difficult Somewhat difficult Somewhat difficult  Somewhat difficult     Data saved with a previous flowsheet row definition       03/13/2024    2:25 PM 02/12/2024    9:08 AM 10/31/2023   10:10 AM 07/31/2023    8:54 AM  GAD 7 : Generalized Anxiety Score  Nervous, Anxious, on Edge 2 3 3 2   Control/stop worrying 1 1 2 2   Worry too much - different things 2 2 1 1   Trouble relaxing 1 3 2 2   Restless 1 2 1 1   Easily annoyed or irritable 2 3 1 1   Afraid - awful might happen 0 1 3 1   Total GAD 7 Score 9 15 13 10   Anxiety Difficulty Somewhat difficult Somewhat difficult Somewhat difficult Somewhat difficult   Relevant past medical, surgical, family and social history reviewed and updated as indicated. Interim medical history since our last visit reviewed. Allergies and medications reviewed and updated.  Review of Systems  Constitutional:  Negative for activity change, appetite change, diaphoresis, fatigue and fever.  Respiratory:  Negative for cough, chest tightness and shortness of breath.    Cardiovascular:  Negative for chest pain, palpitations and leg swelling.  Gastrointestinal: Negative.   Psychiatric/Behavioral:  Positive for decreased concentration (occasional) and sleep disturbance. Negative for self-injury and suicidal ideas. The patient is nervous/anxious (improving).    Per HPI unless specifically indicated above     Objective:    Ht 5' 5.98 (1.676 m)   Wt 128 lb (58.1 kg)   BMI 20.67 kg/m   Wt Readings from Last 3 Encounters:  03/13/24 128 lb (58.1 kg)  11/12/23 124 lb (56.2 kg)  07/31/23 132 lb 12.8 oz (60.2 kg)    Physical Exam Vitals and nursing note reviewed.  Constitutional:      General: She is awake. She is not in acute distress.    Appearance: She is well-developed. She is not ill-appearing.  HENT:     Head: Normocephalic.     Right Ear: Hearing normal.     Left Ear: Hearing normal.  Eyes:     General: Lids are normal.        Right eye: No discharge.        Left eye: No discharge.     Conjunctiva/sclera: Conjunctivae normal.  Pulmonary:     Effort: Pulmonary effort is normal. No accessory muscle usage or respiratory distress.  Musculoskeletal:     Cervical back: Normal range of motion.  Neurological:     Mental Status: She is alert and oriented to person, place, and time.  Psychiatric:        Attention and Perception: Attention normal.        Mood and Affect: Mood normal.        Behavior: Behavior normal. Behavior is cooperative.        Thought Content: Thought content normal.        Judgment: Judgment normal.    Results for orders placed or performed in visit on 12/09/23  HM COLONOSCOPY   Collection Time: 11/01/23 12:00 AM  Result Value Ref Range   HM Colonoscopy See Report (in chart) See Report (in chart), Patient Reported      Assessment & Plan:   Problem List Items Addressed This Visit       Other   Perimenopausal   Struggling with this: night sweats, mood lability, cycle changes, brain fog. Will get her into menopause  specialist at [facility] for further assessment and recommendations. ?hormone therapy.      Relevant Orders   Ambulatory referral to Gynecology   Anxiety and depression - Primary   Chronic, improving.  Denies SI/HI.  Continue Wellbutrin  XL 300 MG daily, Effexor  XR 150 MG daily and Vistaril  as needed only for panic. Educated  her on medication and side effects + BLACK BOX warning.  Discussed adding on therapy or visit with psychiatry if worsening mood OR possibly going up on Adderall . Could also consider addition of hormone therapy in future since perimenopause is triggering worsening symptoms.      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  UDS due next 11/11/24. Controlled subs agreement up to date.  Refills up to date.  Agrees to every 3 month visits, can be virtual if needed.  Her goal in long run is to transition to Effexor  only if possible and come off Adderall , discussed this at length with her and recommend no cold turkey stopping but can work on gradual reduction over time with addition of Effexor . At this time may need to consider increase in Adderall  for short period if ongoing mood shifts with perimenopause.          Follow up plan: Return in about 2 months (around 05/11/2024) for ADD -- refills.      "

## 2024-03-13 NOTE — Assessment & Plan Note (Signed)
 Chronic, ongoing.  UDS due next 11/11/24. Controlled subs agreement up to date.  Refills up to date.  Agrees to every 3 month visits, can be virtual if needed.  Her goal in long run is to transition to Effexor  only if possible and come off Adderall , discussed this at length with her and recommend no cold turkey stopping but can work on gradual reduction over time with addition of Effexor . At this time may need to consider increase in Adderall  for short period if ongoing mood shifts with perimenopause.

## 2024-03-13 NOTE — Assessment & Plan Note (Signed)
 Struggling with this: night sweats, mood lability, cycle changes, brain fog. Will get her into menopause specialist at [facility] for further assessment and recommendations. ?hormone therapy.

## 2024-03-13 NOTE — Assessment & Plan Note (Signed)
 Chronic, improving.  Denies SI/HI.  Continue Wellbutrin  XL 300 MG daily, Effexor  XR 150 MG daily and Vistaril  as needed only for panic. Educated her on medication and side effects + BLACK BOX warning.  Discussed adding on therapy or visit with psychiatry if worsening mood OR possibly going up on Adderall . Could also consider addition of hormone therapy in future since perimenopause is triggering worsening symptoms.

## 2024-03-16 NOTE — Progress Notes (Signed)
 Appointment has been made

## 2024-05-15 ENCOUNTER — Ambulatory Visit: Admitting: Nurse Practitioner
